# Patient Record
Sex: Female | Born: 1985 | Race: Black or African American | Hispanic: No | Marital: Single | Smoking: Never smoker
Health system: Southern US, Community
[De-identification: ages and names within clinical notes are randomized; demographics above are authoritative.]

## PROBLEM LIST (undated history)

## (undated) DIAGNOSIS — O139 Gestational [pregnancy-induced] hypertension without significant proteinuria, unspecified trimester: Secondary | ICD-10-CM

## (undated) DIAGNOSIS — D219 Benign neoplasm of connective and other soft tissue, unspecified: Secondary | ICD-10-CM

## (undated) DIAGNOSIS — A599 Trichomoniasis, unspecified: Secondary | ICD-10-CM

## (undated) DIAGNOSIS — G932 Benign intracranial hypertension: Secondary | ICD-10-CM

## (undated) DIAGNOSIS — F329 Major depressive disorder, single episode, unspecified: Secondary | ICD-10-CM

## (undated) DIAGNOSIS — F419 Anxiety disorder, unspecified: Secondary | ICD-10-CM

## (undated) DIAGNOSIS — N739 Female pelvic inflammatory disease, unspecified: Secondary | ICD-10-CM

## (undated) DIAGNOSIS — Z8619 Personal history of other infectious and parasitic diseases: Secondary | ICD-10-CM

## (undated) DIAGNOSIS — I10 Essential (primary) hypertension: Secondary | ICD-10-CM

## (undated) DIAGNOSIS — F32A Depression, unspecified: Secondary | ICD-10-CM

## (undated) HISTORY — PX: DILATION AND CURETTAGE OF UTERUS: SHX78

## (undated) HISTORY — PX: CERVICAL CERCLAGE: SHX1329

---
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
## 2009-12-05 ENCOUNTER — Inpatient Hospital Stay (HOSPITAL_COMMUNITY): Admission: AD | Admit: 2009-12-05 | Discharge: 2009-12-05 | Payer: Self-pay | Admitting: Obstetrics and Gynecology

## 2009-12-13 ENCOUNTER — Inpatient Hospital Stay (HOSPITAL_COMMUNITY): Admission: AD | Admit: 2009-12-13 | Discharge: 2009-12-13 | Payer: Self-pay | Admitting: Obstetrics & Gynecology

## 2009-12-18 ENCOUNTER — Inpatient Hospital Stay (HOSPITAL_COMMUNITY): Admission: AD | Admit: 2009-12-18 | Discharge: 2009-12-18 | Payer: Self-pay | Admitting: Obstetrics and Gynecology

## 2010-04-14 ENCOUNTER — Emergency Department (HOSPITAL_BASED_OUTPATIENT_CLINIC_OR_DEPARTMENT_OTHER): Admission: EM | Admit: 2010-04-14 | Discharge: 2010-04-14 | Payer: Self-pay | Admitting: Emergency Medicine

## 2010-04-14 ENCOUNTER — Ambulatory Visit: Payer: Self-pay | Admitting: Diagnostic Radiology

## 2010-08-16 ENCOUNTER — Ambulatory Visit (HOSPITAL_COMMUNITY): Payer: Self-pay | Admitting: Psychiatry

## 2010-08-25 ENCOUNTER — Emergency Department (HOSPITAL_BASED_OUTPATIENT_CLINIC_OR_DEPARTMENT_OTHER)
Admission: EM | Admit: 2010-08-25 | Discharge: 2010-08-25 | Payer: Self-pay | Source: Home / Self Care | Admitting: Emergency Medicine

## 2010-08-27 NOTE — L&D Delivery Note (Signed)
Delivery Note At 9:59 PM a viable female was delivered via Vaginal, Spontaneous Delivery (Presentation: ; Occiput Anterior).  APGAR: 9, 9; weight 5 lb 0.3 oz (2276 g).  ( Baby delivered in bed by RN at bedside.  I was in house in OR lounge ~50 feet from pt's room, which is sometimes a dead space for pagers and cell phones.  Staff paged me but not received.  Baby delivered within minutes of last cervical exam when she was complete, 1+ station.)  Placenta status: Intact, Spontaneous. Sent to pathology. Cord: 3 vessels with the following complications: None.  Cord pH: n/a  Anesthesia: Epidural  Episiotomy: None Lacerations: None Suture Repair: none Est. Blood Loss (mL):   Mom to AICU.  Baby to NICU. Not intubated.  Karina [name] 05/03/2011, 12:55 AM

## 2010-09-07 ENCOUNTER — Ambulatory Visit (HOSPITAL_COMMUNITY): Admit: 2010-09-07 | Payer: Self-pay | Admitting: Physician Assistant

## 2010-09-21 ENCOUNTER — Emergency Department (HOSPITAL_BASED_OUTPATIENT_CLINIC_OR_DEPARTMENT_OTHER)
Admission: EM | Admit: 2010-09-21 | Discharge: 2010-09-21 | Payer: Self-pay | Source: Home / Self Care | Admitting: Emergency Medicine

## 2010-09-21 LAB — URINALYSIS, ROUTINE W REFLEX MICROSCOPIC
Bilirubin Urine: NEGATIVE
Hgb urine dipstick: NEGATIVE
Urine Glucose, Fasting: NEGATIVE mg/dL
Urobilinogen, UA: 1 mg/dL (ref 0.0–1.0)

## 2010-09-21 LAB — COMPREHENSIVE METABOLIC PANEL
BUN: 14 mg/dL (ref 6–23)
Calcium: 9.4 mg/dL (ref 8.4–10.5)
GFR calc Af Amer: >60 mL/min (ref >60)
Glucose, Bld: 77 mg/dL (ref 70–99)
Potassium: 4.2 mEq/L (ref 3.5–5.1)
Sodium: 142 mEq/L (ref 135–145)
Total Protein: 7.5 g/dL (ref 6.0–8.3)

## 2010-09-21 LAB — LIPASE, BLOOD: Lipase: 99 U/L (ref 23–300)

## 2010-09-21 LAB — CBC
MCH: 25.2 pg — ABNORMAL LOW (ref 26.0–34.0)
Platelets: 380 10*3/uL (ref 150–400)
RBC: 4.84 MIL/uL (ref 3.87–5.11)
RDW: 16.7 % — ABNORMAL HIGH (ref 11.5–15.5)

## 2010-09-21 LAB — DIFFERENTIAL
Lymphocytes Relative: 28 % (ref 12–46)
Monocytes Absolute: 0.8 10*3/uL (ref 0.1–1.0)
Neutro Abs: 2.6 10*3/uL (ref 1.7–7.7)
Neutrophils Relative %: 53 % (ref 43–77)

## 2010-11-06 LAB — LIPASE, BLOOD: Lipase: 149 U/L (ref 23–300)

## 2010-11-06 LAB — DIFFERENTIAL
Basophils Absolute: 0 10*3/uL (ref 0.0–0.1)
Basophils Relative: 0 % (ref 0–1)
Eosinophils Absolute: 0.1 10*3/uL (ref 0.0–0.7)
Lymphs Abs: 2 10*3/uL (ref 0.7–4.0)
Monocytes Absolute: 0.7 10*3/uL (ref 0.1–1.0)
Neutro Abs: 2.5 10*3/uL (ref 1.7–7.7)
Neutrophils Relative %: 47 % (ref 43–77)

## 2010-11-06 LAB — COMPREHENSIVE METABOLIC PANEL
Albumin: 4.3 g/dL (ref 3.5–5.2)
BUN: 9 mg/dL (ref 6–23)
CO2: 23 mEq/L (ref 19–32)
Calcium: 9.7 mg/dL (ref 8.4–10.5)
Creatinine, Ser: 0.8 mg/dL (ref 0.4–1.2)
GFR calc Af Amer: >60 mL/min (ref >60)
Potassium: 4.1 mEq/L (ref 3.5–5.1)
Sodium: 144 mEq/L (ref 135–145)
Total Protein: 7.8 g/dL (ref 6.0–8.3)

## 2010-11-06 LAB — URINALYSIS, ROUTINE W REFLEX MICROSCOPIC
Bilirubin Urine: NEGATIVE
Glucose, UA: NEGATIVE mg/dL
Ketones, ur: NEGATIVE mg/dL
Specific Gravity, Urine: 1.025 (ref 1.005–1.030)
Urobilinogen, UA: 0.2 mg/dL (ref 0.0–1.0)

## 2010-11-06 LAB — CBC
MCH: 24.9 pg — ABNORMAL LOW (ref 26.0–34.0)
Platelets: 429 10*3/uL — ABNORMAL HIGH (ref 150–400)
WBC: 5.4 10*3/uL (ref 4.0–10.5)

## 2010-11-10 LAB — URINALYSIS, ROUTINE W REFLEX MICROSCOPIC
Glucose, UA: NEGATIVE mg/dL
Nitrite: NEGATIVE
Specific Gravity, Urine: 1.022 (ref 1.005–1.030)
pH: 7 (ref 5.0–8.0)

## 2010-11-14 LAB — COMPREHENSIVE METABOLIC PANEL
Albumin: 3.4 g/dL — ABNORMAL LOW (ref 3.5–5.2)
Alkaline Phosphatase: 64 U/L (ref 39–117)
CO2: 26 mEq/L (ref 19–32)
Chloride: 104 mEq/L (ref 96–112)
Creatinine, Ser: 0.84 mg/dL (ref 0.4–1.2)
GFR calc Af Amer: >60 mL/min (ref >60)
GFR calc non Af Amer: >60 mL/min (ref >60)
Sodium: 138 mEq/L (ref 135–145)

## 2010-11-14 LAB — WET PREP, GENITAL: Yeast Wet Prep HPF POC: NONE SEEN

## 2010-11-14 LAB — DIFFERENTIAL
Basophils Relative: 1 % (ref 0–1)
Eosinophils Absolute: 0.1 10*3/uL (ref 0.0–0.7)
Lymphocytes Relative: 25 % (ref 12–46)
Lymphs Abs: 1.6 10*3/uL (ref 0.7–4.0)
Monocytes Absolute: 0.6 10*3/uL (ref 0.1–1.0)
Monocytes Relative: 10 % (ref 3–12)
Neutro Abs: 3.8 10*3/uL (ref 1.7–7.7)
Neutrophils Relative %: 62 % (ref 43–77)

## 2010-11-14 LAB — CBC
HCT: 29.2 % — ABNORMAL LOW (ref 36.0–46.0)
MCHC: 33.5 g/dL (ref 30.0–36.0)
MCV: 87.1 fL (ref 78.0–100.0)
Platelets: 397 10*3/uL (ref 150–400)
RDW: 15 % (ref 11.5–15.5)

## 2010-11-14 LAB — GC/CHLAMYDIA PROBE AMP, GENITAL: Chlamydia, DNA Probe: NEGATIVE

## 2010-11-15 LAB — WET PREP, GENITAL: Clue Cells Wet Prep HPF POC: NONE SEEN

## 2010-11-15 LAB — CBC
HCT: 37.9 % (ref 36.0–46.0)
Hemoglobin: 12.8 g/dL (ref 12.0–15.0)
RBC: 4.34 MIL/uL (ref 3.87–5.11)

## 2010-11-15 LAB — GC/CHLAMYDIA PROBE AMP, GENITAL
Chlamydia, DNA Probe: NEGATIVE
GC Probe Amp, Genital: NEGATIVE

## 2010-11-28 LAB — RPR
RPR: NONREACTIVE
RPR: NONREACTIVE

## 2010-11-28 LAB — HIV ANTIBODY (ROUTINE TESTING W REFLEX): HIV: NONREACTIVE

## 2010-11-28 LAB — GC/CHLAMYDIA PROBE AMP, GENITAL: Chlamydia: NEGATIVE

## 2011-01-16 ENCOUNTER — Other Ambulatory Visit: Payer: Self-pay | Admitting: Obstetrics and Gynecology

## 2011-01-16 DIAGNOSIS — O2690 Pregnancy related conditions, unspecified, unspecified trimester: Secondary | ICD-10-CM

## 2011-01-19 ENCOUNTER — Other Ambulatory Visit: Payer: Self-pay | Admitting: Obstetrics and Gynecology

## 2011-01-19 ENCOUNTER — Ambulatory Visit (HOSPITAL_COMMUNITY)
Admission: RE | Admit: 2011-01-19 | Discharge: 2011-01-19 | Disposition: A | Discharge status: Home / Self Care | Payer: Medicaid Other | Source: Ambulatory Visit | Attending: Obstetrics and Gynecology | Admitting: Obstetrics and Gynecology

## 2011-01-19 DIAGNOSIS — O358XX0 Maternal care for other (suspected) fetal abnormality and damage, not applicable or unspecified: Secondary | ICD-10-CM | POA: Insufficient documentation

## 2011-01-19 DIAGNOSIS — O2620 Pregnancy care for patient with recurrent pregnancy loss, unspecified trimester: Secondary | ICD-10-CM | POA: Insufficient documentation

## 2011-01-19 DIAGNOSIS — Z363 Encounter for antenatal screening for malformations: Secondary | ICD-10-CM | POA: Insufficient documentation

## 2011-01-19 DIAGNOSIS — Z1389 Encounter for screening for other disorder: Secondary | ICD-10-CM | POA: Insufficient documentation

## 2011-01-19 DIAGNOSIS — O2690 Pregnancy related conditions, unspecified, unspecified trimester: Secondary | ICD-10-CM

## 2011-02-21 ENCOUNTER — Other Ambulatory Visit: Payer: Self-pay | Admitting: Obstetrics and Gynecology

## 2011-02-21 DIAGNOSIS — O3430 Maternal care for cervical incompetence, unspecified trimester: Secondary | ICD-10-CM

## 2011-02-22 ENCOUNTER — Other Ambulatory Visit: Payer: Self-pay | Admitting: Obstetrics and Gynecology

## 2011-02-22 ENCOUNTER — Encounter (HOSPITAL_COMMUNITY): Payer: Self-pay

## 2011-02-22 ENCOUNTER — Ambulatory Visit (HOSPITAL_COMMUNITY)
Admission: RE | Admit: 2011-02-22 | Discharge: 2011-02-22 | Disposition: A | Discharge status: Home / Self Care | Payer: Medicaid Other | Source: Ambulatory Visit | Attending: Obstetrics and Gynecology | Admitting: Obstetrics and Gynecology

## 2011-02-22 DIAGNOSIS — O3430 Maternal care for cervical incompetence, unspecified trimester: Secondary | ICD-10-CM

## 2011-02-22 DIAGNOSIS — O2620 Pregnancy care for patient with recurrent pregnancy loss, unspecified trimester: Secondary | ICD-10-CM

## 2011-03-08 ENCOUNTER — Encounter (HOSPITAL_COMMUNITY): Payer: Self-pay

## 2011-03-08 ENCOUNTER — Ambulatory Visit (HOSPITAL_COMMUNITY)
Admission: RE | Admit: 2011-03-08 | Discharge: 2011-03-08 | Disposition: A | Discharge status: Home / Self Care | Payer: Medicaid Other | Source: Ambulatory Visit | Attending: Obstetrics and Gynecology | Admitting: Obstetrics and Gynecology

## 2011-03-08 ENCOUNTER — Ambulatory Visit (HOSPITAL_COMMUNITY): Payer: Medicaid Other

## 2011-03-08 DIAGNOSIS — O3440 Maternal care for other abnormalities of cervix, unspecified trimester: Secondary | ICD-10-CM | POA: Insufficient documentation

## 2011-03-08 DIAGNOSIS — O2620 Pregnancy care for patient with recurrent pregnancy loss, unspecified trimester: Secondary | ICD-10-CM | POA: Insufficient documentation

## 2011-03-08 DIAGNOSIS — O3430 Maternal care for cervical incompetence, unspecified trimester: Secondary | ICD-10-CM

## 2011-03-22 ENCOUNTER — Ambulatory Visit (HOSPITAL_COMMUNITY)
Admission: RE | Admit: 2011-03-22 | Discharge: 2011-03-22 | Disposition: A | Discharge status: Home / Self Care | Payer: Medicaid Other | Source: Ambulatory Visit | Attending: Obstetrics and Gynecology | Admitting: Obstetrics and Gynecology

## 2011-03-22 DIAGNOSIS — O3430 Maternal care for cervical incompetence, unspecified trimester: Secondary | ICD-10-CM | POA: Insufficient documentation

## 2011-03-22 DIAGNOSIS — O2620 Pregnancy care for patient with recurrent pregnancy loss, unspecified trimester: Secondary | ICD-10-CM | POA: Insufficient documentation

## 2011-04-09 ENCOUNTER — Inpatient Hospital Stay (HOSPITAL_COMMUNITY)
Admission: AD | Admit: 2011-04-09 | Discharge: 2011-04-09 | Disposition: A | Discharge status: Home / Self Care | Payer: Medicaid Other | Source: Ambulatory Visit | Attending: Obstetrics and Gynecology | Admitting: Obstetrics and Gynecology

## 2011-04-09 DIAGNOSIS — O4700 False labor before 37 completed weeks of gestation, unspecified trimester: Secondary | ICD-10-CM | POA: Insufficient documentation

## 2011-04-09 MED ORDER — BETAMETHASONE SOD PHOS & ACET 6 (3-3) MG/ML IJ SUSP
12.0000 mg | Freq: Once | INTRAMUSCULAR | Status: AC
  Administered 2011-04-09: 12 mg via INTRAMUSCULAR
  Filled 2011-04-09: qty 2

## 2011-04-10 ENCOUNTER — Inpatient Hospital Stay (HOSPITAL_COMMUNITY)
Admission: AD | Admit: 2011-04-10 | Discharge: 2011-04-10 | Disposition: A | Discharge status: Home / Self Care | Payer: Medicaid Other | Source: Ambulatory Visit | Attending: Obstetrics and Gynecology | Admitting: Obstetrics and Gynecology

## 2011-04-10 DIAGNOSIS — O4700 False labor before 37 completed weeks of gestation, unspecified trimester: Secondary | ICD-10-CM | POA: Insufficient documentation

## 2011-04-10 MED ORDER — BETAMETHASONE SOD PHOS & ACET 6 (3-3) MG/ML IJ SUSP
12.0000 mg | Freq: Once | INTRAMUSCULAR | Status: AC
  Administered 2011-04-10: 12 mg via INTRAMUSCULAR
  Filled 2011-04-10: qty 2

## 2011-04-22 NOTE — ED Provider Notes (Signed)
25 yo G4 P0 A3, [redacted] weeks pregnant, with shortened cervix on ultrasound. Sent to MAU for betamethasone injection.   P:  Betamethasone 12 mg IM

## 2011-05-01 ENCOUNTER — Inpatient Hospital Stay (HOSPITAL_COMMUNITY): Payer: BC Managed Care – PPO

## 2011-05-01 ENCOUNTER — Inpatient Hospital Stay (HOSPITAL_COMMUNITY)
Admission: AD | Admit: 2011-05-01 | Discharge: 2011-05-06 | DRG: 372 | Disposition: A | Discharge status: Home / Self Care | Payer: BC Managed Care – PPO | Source: Ambulatory Visit | Attending: Obstetrics and Gynecology | Admitting: Obstetrics and Gynecology

## 2011-05-01 ENCOUNTER — Encounter (HOSPITAL_COMMUNITY): Payer: Self-pay | Admitting: *Deleted

## 2011-05-01 DIAGNOSIS — D649 Anemia, unspecified: Secondary | ICD-10-CM

## 2011-05-01 DIAGNOSIS — O1414 Severe pre-eclampsia complicating childbirth: Principal | ICD-10-CM | POA: Diagnosis present

## 2011-05-01 DIAGNOSIS — O139 Gestational [pregnancy-induced] hypertension without significant proteinuria, unspecified trimester: Secondary | ICD-10-CM

## 2011-05-01 HISTORY — DX: Benign neoplasm of connective and other soft tissue, unspecified: D21.9

## 2011-05-01 HISTORY — DX: Trichomoniasis, unspecified: A59.9

## 2011-05-01 LAB — COMPREHENSIVE METABOLIC PANEL
ALT: 6 U/L (ref 0–35)
Alkaline Phosphatase: 159 U/L — ABNORMAL HIGH (ref 39–117)
BUN: 3 mg/dL — ABNORMAL LOW (ref 6–23)
CO2: 23 mEq/L (ref 19–32)
Chloride: 104 mEq/L (ref 96–112)
GFR calc Af Amer: >60 mL/min (ref >60)
GFR calc non Af Amer: >60 mL/min (ref >60)
Glucose, Bld: 88 mg/dL (ref 70–99)
Potassium: 3.4 mEq/L — ABNORMAL LOW (ref 3.5–5.1)
Sodium: 135 mEq/L (ref 135–145)
Total Bilirubin: 0.2 mg/dL — ABNORMAL LOW (ref 0.3–1.2)
Total Protein: 6.8 g/dL (ref 6.0–8.3)

## 2011-05-01 LAB — CBC
HCT: 33.9 % — ABNORMAL LOW (ref 36.0–46.0)
Hemoglobin: 11.5 g/dL — ABNORMAL LOW (ref 12.0–15.0)
RBC: 4.03 MIL/uL (ref 3.87–5.11)
WBC: 7.7 10*3/uL (ref 4.0–10.5)

## 2011-05-01 MED ORDER — LACTATED RINGERS IV SOLN
INTRAVENOUS | Status: DC
  Administered 2011-05-02: 125 mL/h via INTRAVENOUS
  Administered 2011-05-02 – 2011-05-03 (×4): via INTRAVENOUS

## 2011-05-01 MED ORDER — DOCUSATE SODIUM 100 MG PO CAPS
100.0000 mg | ORAL_CAPSULE | Freq: Every day | ORAL | Status: DC
  Administered 2011-05-03 – 2011-05-06 (×4): 100 mg via ORAL
  Filled 2011-05-01 (×4): qty 1

## 2011-05-01 MED ORDER — ZOLPIDEM TARTRATE 10 MG PO TABS
10.0000 mg | ORAL_TABLET | Freq: Every evening | ORAL | Status: DC | PRN
  Administered 2011-05-02: 10 mg via ORAL
  Filled 2011-05-01: qty 1

## 2011-05-01 MED ORDER — NIFEDIPINE 10 MG PO CAPS
10.0000 mg | ORAL_CAPSULE | Freq: Four times a day (QID) | ORAL | Status: DC
  Administered 2011-05-01: 10 mg via ORAL
  Filled 2011-05-01: qty 1

## 2011-05-01 MED ORDER — CALCIUM CARBONATE ANTACID 500 MG PO CHEW: 2.0000 | CHEWABLE_TABLET | ORAL | Status: DC | PRN

## 2011-05-01 MED ORDER — PRENATAL PLUS 27-1 MG PO TABS: 1.0000 | ORAL_TABLET | Freq: Every day | ORAL | Status: DC

## 2011-05-01 MED ORDER — HYDRALAZINE HCL 20 MG/ML IJ SOLN
10.0000 mg | Freq: Once | INTRAMUSCULAR | Status: AC
  Administered 2011-05-01: 10 mg via INTRAVENOUS

## 2011-05-01 MED ORDER — HYDRALAZINE HCL 20 MG/ML IJ SOLN
5.0000 mg | Freq: Once | INTRAMUSCULAR | Status: AC
  Administered 2011-05-01: 5 mg via INTRAVENOUS
  Filled 2011-05-01: qty 1

## 2011-05-01 MED ORDER — LACTATED RINGERS IV SOLN
Freq: Once | INTRAVENOUS | Status: AC
  Administered 2011-05-02: via INTRAVENOUS

## 2011-05-01 MED ORDER — ACETAMINOPHEN 325 MG PO TABS
650.0000 mg | ORAL_TABLET | ORAL | Status: DC | PRN
  Administered 2011-05-02: 650 mg via ORAL
  Filled 2011-05-01: qty 2

## 2011-05-01 MED ORDER — HYDRALAZINE HCL 20 MG/ML IJ SOLN
INTRAMUSCULAR | Status: AC
  Administered 2011-05-01: 10 mg via INTRAVENOUS
  Filled 2011-05-01: qty 1

## 2011-05-01 NOTE — ED Notes (Signed)
Pt to U/S via wheelchair.  Pt will go to room 157 from U/S.

## 2011-05-01 NOTE — ED Notes (Signed)
Report called to [facility] on antenatal. Pt. will go to room 157

## 2011-05-01 NOTE — Progress Notes (Signed)
Pt states she has had elevated BP. Sent from the office today for evaluation.

## 2011-05-01 NOTE — ED Notes (Signed)
Report given to [employer].  Pt to go to room 157 after U/S.

## 2011-05-01 NOTE — ED Provider Notes (Signed)
History   Pt presents today for [facility] evaluation. She was sent from the office with elevated BP. She had a 24hr urine done in the office but the results are not available. She denies HA, blurry vision, or RUQ pain. She reports GFM.  Chief Complaint  Patient presents with  . Hypertension   HPI  OB History    Grav Para Term Preterm Abortions TAB SAB Ect Mult Living   4 0 0 0 3 0 3 0 0 0       Past Medical History  Diagnosis Date  . Fibroids   . Trichomonas     Past Surgical History  Procedure Date  . Dilation and curettage of uterus     No family history on file.  History  Substance Use Topics  . Smoking status: Former Games developer  . Smokeless tobacco: Never Used  . Alcohol Use: No    Allergies: No Known Allergies  Prescriptions prior to admission  Medication Sig Dispense Refill  . metroNIDAZOLE (FLAGYL) 500 MG tablet Take 500 mg by mouth 2 (two) times daily. Take for 1 week as instructed       . Prenatal MV-Min-Fe Fum-FA-DHA (PRENATAL 1 PO) Take 1 tablet by mouth daily.       . MetroNIDAZOLE (FLAGYL PO) Take by mouth. Pt states she is almost done taking a course of this for a bacterial infection.        Review of Systems  Constitutional: Negative for fever.  Eyes: Negative for blurred vision.  Cardiovascular: Negative for chest pain and palpitations.  Gastrointestinal: Negative for nausea, vomiting, abdominal pain, diarrhea and constipation.  Genitourinary: Negative for dysuria, urgency, frequency and hematuria.  Neurological: Negative for dizziness and headaches.  Psychiatric/Behavioral: Negative for depression and suicidal ideas.   Physical Exam   Blood pressure 151/102, pulse 78, temperature 98.7 F (37.1 C), temperature source Oral, resp. rate 20, height 5\' 5"  (1.651 m), weight 232 lb (105.235 kg), last menstrual period 09/04/2010, SpO2 97.00%.  Physical Exam  Constitutional: She is oriented to person, place, and time. She appears well-developed and  well-nourished. No distress.  HENT:  Head: Normocephalic and atraumatic.  Eyes: EOM are normal. Pupils are equal, round, and reactive to light.  GI: Soft. She exhibits no distension. There is no tenderness. There is no rebound and no guarding.  Neurological: She is alert and oriented to person, place, and time.  Skin: Skin is warm and dry. She is not diaphoretic.  Psychiatric: She has a normal mood and affect. Her behavior is normal. Judgment and thought content normal.    MAU Course  Procedures  Discussed pt with Dr. Richardson Dopp. Dr. Richardson Dopp desires to admit pt for observation and preeclamptic workup. She also ordered a BPP and [place] for EFW.  Assessment and Plan  Admit.  Clinton Gallant. Rice III, DrHSc, MPAS, PA-C  05/01/2011, 6:39 PM   Henrietta Hoover, PA 05/01/11 1846

## 2011-05-02 ENCOUNTER — Encounter (HOSPITAL_COMMUNITY): Payer: Self-pay | Admitting: Anesthesiology

## 2011-05-02 ENCOUNTER — Encounter (HOSPITAL_COMMUNITY): Payer: Self-pay | Admitting: *Deleted

## 2011-05-02 ENCOUNTER — Inpatient Hospital Stay (HOSPITAL_COMMUNITY): Payer: BC Managed Care – PPO | Admitting: Anesthesiology

## 2011-05-02 LAB — URINALYSIS, DIPSTICK ONLY
Bilirubin Urine: NEGATIVE
Glucose, UA: NEGATIVE mg/dL
Ketones, ur: 15 mg/dL — AB
Leukocytes, UA: NEGATIVE
Nitrite: NEGATIVE
Specific Gravity, Urine: >1.03 — ABNORMAL HIGH (ref 1.005–1.030)
pH: 6 (ref 5.0–8.0)

## 2011-05-02 LAB — CBC
MCH: 28.6 pg (ref 26.0–34.0)
MCHC: 34.1 g/dL (ref 30.0–36.0)
MCV: 84.1 fL (ref 78.0–100.0)
Platelets: 267 10*3/uL (ref 150–400)
Platelets: 321 10*3/uL (ref 150–400)
RDW: 14.5 % (ref 11.5–15.5)
RDW: 14.7 % (ref 11.5–15.5)
WBC: 12.2 10*3/uL — ABNORMAL HIGH (ref 4.0–10.5)

## 2011-05-02 LAB — COMPREHENSIVE METABOLIC PANEL
AST: 11 U/L (ref 0–37)
AST: 13 U/L (ref 0–37)
Albumin: 2.5 g/dL — ABNORMAL LOW (ref 3.5–5.2)
Albumin: 2.6 g/dL — ABNORMAL LOW (ref 3.5–5.2)
Alkaline Phosphatase: 161 U/L — ABNORMAL HIGH (ref 39–117)
BUN: 3 mg/dL — ABNORMAL LOW (ref 6–23)
Calcium: 9 mg/dL (ref 8.4–10.5)
Chloride: 101 mEq/L (ref 96–112)
Creatinine, Ser: 0.54 mg/dL (ref 0.50–1.10)
GFR calc non Af Amer: >60 mL/min (ref >60)
Potassium: 3.3 mEq/L — ABNORMAL LOW (ref 3.5–5.1)
Total Bilirubin: 0.3 mg/dL (ref 0.3–1.2)
Total Protein: 6.4 g/dL (ref 6.0–8.3)

## 2011-05-02 LAB — LACTATE DEHYDROGENASE
LDH: 152 U/L (ref 94–250)
LDH: 157 U/L (ref 94–250)

## 2011-05-02 LAB — URIC ACID: Uric Acid, Serum: 5 mg/dL (ref 2.4–7.0)

## 2011-05-02 LAB — MAGNESIUM: Magnesium: 4.9 mg/dL — ABNORMAL HIGH (ref 1.5–2.5)

## 2011-05-02 MED ORDER — IBUPROFEN 600 MG PO TABS: 600.0000 mg | ORAL_TABLET | Freq: Four times a day (QID) | ORAL | Status: DC | PRN

## 2011-05-02 MED ORDER — ACETAMINOPHEN 325 MG PO TABS
650.0000 mg | ORAL_TABLET | ORAL | Status: DC | PRN
Start: 2011-05-02 — End: 2011-05-02

## 2011-05-02 MED ORDER — MORPHINE SULFATE 4 MG/ML IJ SOLN
2.0000 mg | Freq: Once | INTRAMUSCULAR | Status: AC
  Administered 2011-05-02: 2 mg via INTRAVENOUS
  Filled 2011-05-02: qty 1

## 2011-05-02 MED ORDER — MAGNESIUM SULFATE 40 G IN LACTATED RINGERS - SIMPLE
2.0000 g/h | INTRAVENOUS | Status: DC
  Administered 2011-05-02: 4 g/h via INTRAVENOUS
  Filled 2011-05-02: qty 500

## 2011-05-02 MED ORDER — BUTORPHANOL TARTRATE 2 MG/ML IJ SOLN: 1.0000 mg | INTRAMUSCULAR | Status: DC | PRN

## 2011-05-02 MED ORDER — OXYCODONE-ACETAMINOPHEN 5-325 MG PO TABS
2.0000 | ORAL_TABLET | Freq: Once | ORAL | Status: AC
  Administered 2011-05-02: 2 via ORAL
  Filled 2011-05-02: qty 2

## 2011-05-02 MED ORDER — PENICILLIN G POTASSIUM 5000000 UNITS IJ SOLR
2.5000 10*6.[IU] | INTRAVENOUS | Status: DC
  Administered 2011-05-02: 2.5 10*6.[IU] via INTRAVENOUS
  Filled 2011-05-02 (×4): qty 2.5

## 2011-05-02 MED ORDER — OXYCODONE-ACETAMINOPHEN 5-325 MG PO TABS: 2.0000 | ORAL_TABLET | ORAL | Status: DC | PRN

## 2011-05-02 MED ORDER — HYDRALAZINE HCL 20 MG/ML IJ SOLN
5.0000 mg | INTRAMUSCULAR | Status: DC | PRN
  Administered 2011-05-02 – 2011-05-04 (×6): 5 mg via INTRAVENOUS
  Filled 2011-05-02 (×4): qty 1

## 2011-05-02 MED ORDER — LACTATED RINGERS IV SOLN: INTRAVENOUS | Status: DC

## 2011-05-02 MED ORDER — LACTATED RINGERS IV SOLN: 500.0000 mL | Freq: Once | INTRAVENOUS | Status: DC

## 2011-05-02 MED ORDER — ONDANSETRON HCL 4 MG/2ML IJ SOLN
4.0000 mg | Freq: Four times a day (QID) | INTRAMUSCULAR | Status: DC | PRN
  Administered 2011-05-02 (×2): 4 mg via INTRAVENOUS
  Filled 2011-05-02 (×3): qty 2

## 2011-05-02 MED ORDER — FLEET ENEMA 7-19 GM/118ML RE ENEM: 1.0000 | ENEMA | RECTAL | Status: DC | PRN

## 2011-05-02 MED ORDER — OXYTOCIN 20 UNITS IN LACTATED RINGERS INFUSION - SIMPLE
1.0000 m[IU]/min | INTRAVENOUS | Status: DC
  Administered 2011-05-02: 2 m[IU]/min via INTRAVENOUS

## 2011-05-02 MED ORDER — PHENYLEPHRINE 40 MCG/ML (10ML) SYRINGE FOR IV PUSH (FOR BLOOD PRESSURE SUPPORT): 80.0000 ug | PREFILLED_SYRINGE | INTRAVENOUS | Status: DC | PRN

## 2011-05-02 MED ORDER — CITRIC ACID-SODIUM CITRATE 334-500 MG/5ML PO SOLN
30.0000 mL | ORAL | Status: DC | PRN
  Filled 2011-05-02: qty 15

## 2011-05-02 MED ORDER — HYDRALAZINE HCL 20 MG/ML IJ SOLN
INTRAMUSCULAR | Status: AC
  Administered 2011-05-02: 5 mg via INTRAVENOUS
  Filled 2011-05-02: qty 1

## 2011-05-02 MED ORDER — PHENYLEPHRINE 40 MCG/ML (10ML) SYRINGE FOR IV PUSH (FOR BLOOD PRESSURE SUPPORT)
80.0000 ug | PREFILLED_SYRINGE | INTRAVENOUS | Status: DC | PRN
  Filled 2011-05-02: qty 5

## 2011-05-02 MED ORDER — FAMOTIDINE IN NACL 20-0.9 MG/50ML-% IV SOLN
20.0000 mg | Freq: Two times a day (BID) | INTRAVENOUS | Status: DC
  Administered 2011-05-02: 20 mg via INTRAVENOUS
  Filled 2011-05-02 (×2): qty 50

## 2011-05-02 MED ORDER — OXYTOCIN BOLUS FROM INFUSION
500.0000 mL | Freq: Once | INTRAVENOUS | Status: DC
  Filled 2011-05-02: qty 500
  Filled 2011-05-02: qty 1000

## 2011-05-02 MED ORDER — FENTANYL 2.5 MCG/ML BUPIVACAINE 1/10 % EPIDURAL INFUSION (WH - ANES)
14.0000 mL/h | INTRAMUSCULAR | Status: DC
  Administered 2011-05-02 (×3): 14 mL/h via EPIDURAL
  Filled 2011-05-02 (×3): qty 60

## 2011-05-02 MED ORDER — LABETALOL HCL 5 MG/ML IV SOLN: 10.0000 mg | INTRAVENOUS | Status: DC

## 2011-05-02 MED ORDER — PENICILLIN G POTASSIUM 5000000 UNITS IJ SOLR
5.0000 10*6.[IU] | Freq: Once | INTRAVENOUS | Status: DC
  Administered 2011-05-02: 5 10*6.[IU] via INTRAVENOUS
  Filled 2011-05-02: qty 5

## 2011-05-02 MED ORDER — LACTATED RINGERS IV SOLN: 500.0000 mL | INTRAVENOUS | Status: DC | PRN

## 2011-05-02 MED ORDER — HYDRALAZINE HCL 20 MG/ML IJ SOLN
5.0000 mg | Freq: Once | INTRAMUSCULAR | Status: AC
  Administered 2011-05-02: 5 mg via INTRAVENOUS

## 2011-05-02 MED ORDER — MAGNESIUM SULFATE BOLUS VIA INFUSION
4.0000 g | Freq: Once | INTRAVENOUS | Status: DC
  Filled 2011-05-02: qty 500

## 2011-05-02 MED ORDER — EPHEDRINE 5 MG/ML INJ: 10.0000 mg | INTRAVENOUS | Status: DC | PRN

## 2011-05-02 MED ORDER — EPHEDRINE 5 MG/ML INJ
10.0000 mg | INTRAVENOUS | Status: DC | PRN
  Filled 2011-05-02: qty 4

## 2011-05-02 MED ORDER — LIDOCAINE HCL (PF) 1 % IJ SOLN: 30.0000 mL | INTRAMUSCULAR | Status: DC | PRN

## 2011-05-02 MED ORDER — OXYTOCIN 20 UNITS IN LACTATED RINGERS INFUSION - SIMPLE: 125.0000 mL/h | Freq: Once | INTRAVENOUS | Status: DC

## 2011-05-02 MED ORDER — LIDOCAINE HCL 1.5 % IJ SOLN
INTRAMUSCULAR | Status: DC | PRN
  Administered 2011-05-02: 2 mL via EPIDURAL
  Administered 2011-05-02: 5 mL via EPIDURAL

## 2011-05-02 MED ORDER — TERBUTALINE SULFATE 1 MG/ML IJ SOLN: 0.2500 mg | Freq: Once | INTRAMUSCULAR | Status: DC | PRN

## 2011-05-02 MED ORDER — DIPHENHYDRAMINE HCL 50 MG/ML IJ SOLN: 12.5000 mg | INTRAMUSCULAR | Status: DC | PRN

## 2011-05-02 MED ORDER — METHYLDOPA 250 MG PO TABS
250.0000 mg | ORAL_TABLET | Freq: Two times a day (BID) | ORAL | Status: DC
  Administered 2011-05-02: 250 mg via ORAL
  Filled 2011-05-02 (×2): qty 1

## 2011-05-02 NOTE — Anesthesia Procedure Notes (Signed)
Epidural Patient location during procedure: OB Start time: 05/02/2011 1:00 PM Reason for block: procedure for pain  Staffing Performed by: anesthesiologist   Preanesthetic Checklist Completed: patient identified, site marked, surgical consent, pre-op evaluation, timeout performed, IV checked, risks and benefits discussed and monitors and equipment checked  Epidural Patient position: sitting Prep: site prepped and draped and DuraPrep Patient monitoring: continuous pulse ox and blood pressure Approach: midline Injection technique: LOR air  Needle:  Needle type: Tuohy  Needle gauge: 17 G Needle length: 9 cm Needle insertion depth: 7 cm Catheter type: closed end flexible Catheter size: 19 Gauge Catheter at skin depth: 12 cm Test dose: negative  Assessment Events: blood not aspirated, injection not painful, no injection resistance, negative IV test and no paresthesia  Additional Notes Discussed risk of headache, infection, bleeding, nerve injury and failed or incomplete block.  Patient voices understanding and wishes to proceed.

## 2011-05-02 NOTE — H&P (Signed)
Karina [name] is a 25 y.o. female G3P0030 at 63 1/7 weeks presenting for admission for observation due to an elevated blood pressure at a routine ob visit.   Blood pressure started elevating 2 weeks ago.  24 hour urine protein 102.  BP on arrival to office yesterday was 158/108; trace urine protein.  Pt asymptomatic at that time.  Blood pressure significantly elevated overnight, highest 170s/117.  Pt currently is complaining of headache and dizziness.  Headache unrelieved with Percocet.  RN ready to give Morphine as ordered by on call MD.    Maternal Medical History:  Reason for admission: Elevated blood pressure at a routine ob visit  Contractions: Increased overnight after admission.  After IVF bolus and Procardia, pt without contractions.  Fetal activity: Perceived fetal activity is normal.    Prenatal complications: Blood pressure started elevating 2 weeks ago.  24 hour urine protein 104.  BP on arrival to office yesterday was 158/108.  Pt asymptomatic at that time.  Short cervix s/p McDonald cerclage at 19 weeks. H/o SAB at 17 weeks (PPROM) .  Pt has been on Progesterone injections.    Pt reports she was given BMZ a few weeks ago, which was not seen in the limited prenatal record. OB History    Grav Para Term Preterm Abortions TAB SAB Ect Mult Living   4 0 0 0 3 0 3 0 0 0      Past Medical History  Diagnosis Date  . Fibroids   . Trichomonas    Past Surgical History  Procedure Date  . Dilation and curettage of uterus    Family History: Hypertension. Social History:  reports that she has quit smoking. She has never used smokeless tobacco. She reports that she does not drink alcohol or use illicit drugs.    Review of Systems  Constitutional: Negative for fever.  HENT:       Unrelieved with Percocet overnight.  Eyes: Negative for blurred vision.       No scotomata. Feels dizzy.  Respiratory: Negative for shortness of breath.   Gastrointestinal: Positive for vomiting  and abdominal pain.       Abdominal pain with contractions.  Denies RUQ pain.  Neurological: Positive for dizziness and headaches.   BP 152/101, 133/83.  Afebrile.  Nl HR.  Maternal Exam:  Uterine Assessment: Contraction frequency is irregular.   Abdomen: Fetal presentation: vertex No RUQ tenderness.  Introitus: not evaluated.   Ferning test: not done.  Nitrazine test: not done.  Cervix: not evaluated.   Physical Exam  Constitutional: She is oriented to person, place, and time. She appears well-developed and well-nourished.       Appears uncomfortable.  HENT:  Head: Normocephalic and atraumatic.  Eyes: EOM are normal.  Cardiovascular: Normal rate and regular rhythm.   Respiratory: Effort normal and breath sounds normal. No respiratory distress. She has no wheezes. She has no rales.  GI: There is no tenderness.       Gravid  Musculoskeletal: Normal range of motion.  Neurological: She is alert and oriented to person, place, and time.       None illicited.  Skin: Skin is warm and dry.     PIH labs normal on admission.  24 hr urine in progress.  Prenatal labs: (unable to access Prenatal records to review labs) Rubella:   RPR: NON REACTIVE (09/05 0852)  HBsAg:    HIV:    GBS:   Unknown Rh Positive  [place] (05/01/11)  Vtx.  AFI nl.  Wt 5 lbs. 2 oz.  Assessment/Plan: Severe Preeclampsia- Pt with severe range BPs overnight.  S/p Hydralazine x 3.  Given Procardia overnight after hydralazine for contractions.  BP improved overnight but now increasing.  Currently with headache unrelieved with medication.  Not hyperreflexic. Discussed with MFM Dr. Particia Nearing and she agrees to proceed with delivery due to Severe Preeclampsia. Will start Magnesium Sulfate. NICU consult. Cerclage removal on L&D.  Karina [name] 05/02/2011, 7:31 PM

## 2011-05-02 NOTE — Consult Note (Signed)
[facility]  Neonatal Medicine Consultation       05/02/2011    7:51 PM  I was called at the request of the patient's obstetrician (Dr. Dion Body) to speak to this patient due to induction of labor at 34 weeks.  Mom admitted yesterday with preeclampsia, and decision made today to start induction.  She has been given betamethasone.  I reviewed expectations for a 34 week newborn, and predicted likely length of stay.  Mom had a few questions which I answered.  _____________________ Electronically Signed By: Angelita Ingles, MD Neonatologist

## 2011-05-02 NOTE — Progress Notes (Signed)
Pt stated that she was feeling pain at  3/10.  Epidural level was checked and found to be a T 10.  Pt was repositioned to left tilt

## 2011-05-02 NOTE — Procedures (Signed)
Cerclage Removal Pre-procedure Diagnosis-Severe Preeclampsia, contractions  SVE 1cm 50%, knot at 12 o'clock. Graves speculum inserted.  Frothy yellow discharge noted and wet prep collected. Merselene tape visualized.  Knot tented up and cerclage cut.  The knot came out with two tails but the entire cerclage was not removed.  Attempted to explore anterior cervix with Allis clamp.  Merselene not seen.  Due to pt discomfort retrieval of remaining suture aborted.  Collected wet prep again b/c previous sample was not labeled.  As I did not put in the cerclage, I discussed the technique with MFM on call.  Confirmed it was a McDonald cerclage and how the knots were placed.  Suspected I only cut the air knot. An epidural was placed after magnesium bolus. Pt comfortable.  ~Two hours after the original attempt, SVE done and remaining knot/suture was palpated at 12 o'clock. Dr. Richardson Dopp at bedside to assist and also examined pt.  Once knot palpated, speculum was inserted, cervix prepped with betadine and anterior lip of cervix was retracted posteriorly.  Suture was then visualized, grasped with Allis clamp and easily removed in its entirety.  Pt tolerated the procedure well. The second wet prep opened in the bag and could not be used.  Wet prep not repeated b/c of bleeding and betadine had been used.  EBL minimal.

## 2011-05-02 NOTE — Progress Notes (Signed)
MARITSA HUNSUCKER is a 25 y.o. G4P0030 at [redacted]w[redacted]d   Subjective: Pt denies headache comfortable with epidural.  Occasional vomiting.  Objective:  140s-150s/80-90s  FHT:  Reassuring UC:  Irregular contractions SVE:   Dilation: 3.5 Effacement (%): 70 Station: -1 Exam by:: Dr Dion Body AROM blood tinged.  Suspect it is clear. Labs: Lab Results  Component Value Date   WBC 7.6 05/02/2011   HGB 11.2* 05/02/2011   HCT 32.9* 05/02/2011   MCV 84.1 05/02/2011   PLT 267 05/02/2011     Assessment / Plan: Severe Preeclampsia on MgSO4 and Pitocin S/p AROM. GBS unknown.  2nd dose of PCN infusing now     Labor: Minimal change Preeclampsia:  on magnesium sulfate and no signs or symptoms of toxicity Fetal Wellbeing:  Reassuring Pain Control:  Epidural I/D:  n/a Anticipated MOD:  NSVD  Arling Cerone 05/02/2011, 9:05 PM

## 2011-05-02 NOTE — Anesthesia Preprocedure Evaluation (Signed)
Anesthesia Evaluation  Name, MR# and DOB Patient awake  General Assessment Comment  Reviewed: Allergy & Precautions, H&P , NPO status , Patient's Chart, lab work & pertinent test results, reviewed documented beta blocker date and time   History of Anesthesia Complications Negative for: history of anesthetic complications  Airway Mallampati: I TM Distance: >3 FB Neck ROM: full    Dental  (+) Teeth Intact   Pulmonary  clear to auscultation  breath sounds clear to auscultation none    Cardiovascular hypertension (PIH on magnesium), regular Normal    Neuro/Psych Negative Neurological ROS  Negative Psych ROS  GI/Hepatic/Renal negative GI ROS  negative Liver ROS  negative Renal ROS        Endo/Other  Negative Endocrine ROS (+)      Abdominal   Musculoskeletal   Hematology negative hematology ROS (+)   Peds  Reproductive/Obstetrics (+) Pregnancy    Anesthesia Other Findings             Anesthesia Physical Anesthesia Plan  ASA: III  Anesthesia Plan: Epidural   Post-op Pain Management:    Induction:   Airway Management Planned:   Additional Equipment:   Intra-op Plan:   Post-operative Plan:   Informed Consent: I have reviewed the patients History and Physical, chart, labs and discussed the procedure including the risks, benefits and alternatives for the proposed anesthesia with the patient or authorized representative who has indicated his/her understanding and acceptance.     Plan Discussed with:   Anesthesia Plan Comments:         Anesthesia Quick Evaluation

## 2011-05-02 NOTE — Progress Notes (Signed)
Karina [name] is a 25 y.o. G4P0030 at [redacted]w[redacted]d Subjective: Pt with headache, 1 out of 10 on pain scale.  Declines Tylenol PR.  Feels warm.  No visual changes. Continues to have nausea and vomiting.  Pt given Zofran recently.  Objective: BP 160/93  Pulse 85  Temp(Src) 98.5 F (36.9 C) (Oral)  Resp 18  Ht 5\' 5"  (1.651 m)  Wt 105.235 kg (232 lb)  BMI 38.61 kg/m2  SpO2 98%  LMP 09/04/2010 I/O last 3 completed shifts: In: 4280.4 [P.O.:1140; I.V.:2790.4; IV Piggyback:350] Out: 3330 [Urine:3180; Emesis/NG output:150] I/O this shift: In: 50 [P.O.:50] Out: 225 [Emesis/NG output:225]  FHT:  FHR: 130s bpm, variability: moderate,  accelerations:  Present,  decelerations:  Present variable decel x 2 UC:   regular, every 3-4 minutes SVE:   4/90/0.  Thicker cervix on right side. Neuro: 1+ per RN  Labs: Lab Results  Component Value Date   WBC 7.6 05/02/2011   HGB 11.2* 05/02/2011   HCT 32.9* 05/02/2011   MCV 84.1 05/02/2011   PLT 267 05/02/2011  Mg level 4.9 at ~1730.  Assessment / Plan: Induction of labor due to preeclampsia,  progressing well on pitocin  Progressing to active labor. N/V.  Repeat PIH labs.  Pepcid hanging.  Check mg level with PIH panel.  If less than 5, increase.  Labor: Progressing to active labor.  Pitocin increased appriorately. Preeclampsia:  on magnesium sulfate, no signs or symptoms of toxicity and labs stable.  Repeating labs due to nausea/vomiting. Fetal Wellbeing:  Reassuring Pain Control:  Epidural I/D:  NSVD Anticipated MOD:  NSVD  Karina [name] 05/02/2011, 8:29 PM

## 2011-05-02 NOTE — Progress Notes (Signed)
Pt complaining of feeling pressure with contractions

## 2011-05-03 ENCOUNTER — Encounter (HOSPITAL_COMMUNITY): Payer: Self-pay | Admitting: *Deleted

## 2011-05-03 ENCOUNTER — Other Ambulatory Visit: Payer: Self-pay | Admitting: Obstetrics and Gynecology

## 2011-05-03 LAB — CBC
Hemoglobin: 10.7 g/dL — ABNORMAL LOW (ref 12.0–15.0)
MCH: 28.5 pg (ref 26.0–34.0)
MCHC: 33.3 g/dL (ref 30.0–36.0)
RDW: 14.7 % (ref 11.5–15.5)

## 2011-05-03 LAB — MRSA PCR SCREENING: MRSA by PCR: NEGATIVE

## 2011-05-03 LAB — POTASSIUM: Potassium: 3.5 mEq/L (ref 3.5–5.1)

## 2011-05-03 MED ORDER — MAGNESIUM SULFATE 40 G IN LACTATED RINGERS - SIMPLE
2.5000 g/h | INTRAVENOUS | Status: DC
  Administered 2011-05-03: 2 g/h via INTRAVENOUS
  Administered 2011-05-03: 2.5 g/h via INTRAVENOUS
  Filled 2011-05-03: qty 500

## 2011-05-03 MED ORDER — TETANUS-DIPHTH-ACELL PERTUSSIS 5-2.5-18.5 LF-MCG/0.5 IM SUSP
0.5000 mL | Freq: Once | INTRAMUSCULAR | Status: DC
  Filled 2011-05-03: qty 0.5

## 2011-05-03 MED ORDER — ZOLPIDEM TARTRATE 5 MG PO TABS
5.0000 mg | ORAL_TABLET | Freq: Every evening | ORAL | Status: DC | PRN
  Administered 2011-05-05: 5 mg via ORAL
  Filled 2011-05-03: qty 1

## 2011-05-03 MED ORDER — SODIUM CHLORIDE 0.9 % IJ SOLN
3.0000 mL | Freq: Once | INTRAMUSCULAR | Status: AC
  Administered 2011-05-03: 3 mL via INTRAVENOUS

## 2011-05-03 MED ORDER — METHYLDOPA 250 MG PO TABS
250.0000 mg | ORAL_TABLET | Freq: Three times a day (TID) | ORAL | Status: DC
  Administered 2011-05-03 – 2011-05-04 (×4): 250 mg via ORAL
  Filled 2011-05-03 (×3): qty 1

## 2011-05-03 MED ORDER — SODIUM CHLORIDE 0.9 % IJ SOLN
3.0000 mL | INTRAMUSCULAR | Status: DC | PRN
  Administered 2011-05-03 – 2011-05-04 (×2): 3 mL via INTRAVENOUS

## 2011-05-03 MED ORDER — SENNOSIDES-DOCUSATE SODIUM 8.6-50 MG PO TABS
2.0000 | ORAL_TABLET | Freq: Every day | ORAL | Status: DC
  Administered 2011-05-03 – 2011-05-05 (×3): 2 via ORAL

## 2011-05-03 MED ORDER — SIMETHICONE 80 MG PO CHEW: 80.0000 mg | CHEWABLE_TABLET | ORAL | Status: DC | PRN

## 2011-05-03 MED ORDER — BENZOCAINE-MENTHOL 20-0.5 % EX AERO
1.0000 "application " | INHALATION_SPRAY | CUTANEOUS | Status: DC | PRN
  Administered 2011-05-04: 1 via TOPICAL

## 2011-05-03 MED ORDER — HYDRALAZINE HCL 20 MG/ML IJ SOLN
5.0000 mg | Freq: Once | INTRAMUSCULAR | Status: AC
  Administered 2011-05-03: 5 mg via INTRAVENOUS
  Filled 2011-05-03: qty 1

## 2011-05-03 MED ORDER — DIPHENHYDRAMINE HCL 25 MG PO CAPS: 25.0000 mg | ORAL_CAPSULE | Freq: Four times a day (QID) | ORAL | Status: DC | PRN

## 2011-05-03 MED ORDER — IBUPROFEN 600 MG PO TABS
600.0000 mg | ORAL_TABLET | Freq: Four times a day (QID) | ORAL | Status: DC
  Administered 2011-05-03 – 2011-05-06 (×15): 600 mg via ORAL
  Filled 2011-05-03 (×16): qty 1

## 2011-05-03 MED ORDER — WITCH HAZEL-GLYCERIN EX PADS: 1.0000 "application " | MEDICATED_PAD | CUTANEOUS | Status: DC | PRN

## 2011-05-03 MED ORDER — PRENATAL PLUS 27-1 MG PO TABS
1.0000 | ORAL_TABLET | Freq: Every day | ORAL | Status: DC
  Administered 2011-05-03 – 2011-05-06 (×4): 1 via ORAL
  Filled 2011-05-03 (×4): qty 1

## 2011-05-03 MED ORDER — DIBUCAINE 1 % RE OINT: 1.0000 "application " | TOPICAL_OINTMENT | RECTAL | Status: DC | PRN

## 2011-05-03 MED ORDER — LANOLIN HYDROUS EX OINT: TOPICAL_OINTMENT | CUTANEOUS | Status: DC | PRN

## 2011-05-03 MED ORDER — ONDANSETRON HCL 4 MG/2ML IJ SOLN: 4.0000 mg | INTRAMUSCULAR | Status: DC | PRN

## 2011-05-03 MED ORDER — OXYCODONE-ACETAMINOPHEN 5-325 MG PO TABS
1.0000 | ORAL_TABLET | ORAL | Status: DC | PRN
  Administered 2011-05-03 – 2011-05-05 (×7): 1 via ORAL
  Filled 2011-05-03 (×7): qty 1

## 2011-05-03 MED ORDER — ONDANSETRON HCL 4 MG PO TABS: 4.0000 mg | ORAL_TABLET | ORAL | Status: DC | PRN

## 2011-05-03 MED ORDER — MAGNESIUM HYDROXIDE 400 MG/5ML PO SUSP: 30.0000 mL | ORAL | Status: DC | PRN

## 2011-05-03 NOTE — Progress Notes (Signed)
Post Partum Day 1 Subjective: no complaints. Denies headaches, visual changes.  Baby doing well in NICU.  Objective: Blood pressure 151/103, pulse 74, temperature 97.5 F (36.4 C), temperature source Oral, resp. rate 20, height 5\' 5"  (1.651 m), weight 102.967 kg (227 lb), last menstrual period 09/04/2010, SpO2 100.00%, unknown if currently breastfeeding. 2000 cc urine/ 5 hours.  Physical Exam:  General: alert and cooperative Lochia: appropriate per RN  Uterine Fundus: firm Incision: dehiscence present N/a DVT Evaluation: No evidence of DVT seen on physical exam. Neuro 2-3+    Basename 05/03/11 0614 05/02/11 2110  HGB 10.7* 12.0  HCT 32.1* 35.2*    Assessment/Plan: Breastfeeding and Lactation consult Severe Preeclampsia.  Significant diuresis.  Mg level previously low normal.  Increased to 2.5 mg. Pt given Hydralazine IV push now.  Changed Aldomet to TID. Breast pump to bed side. Potassium level pending. Will check out to Dr. Richardson Dopp this am.   LOS: 2 days   Karina [name] 05/03/2011, 6:47 AM

## 2011-05-03 NOTE — Progress Notes (Signed)
UR chart review completed.  

## 2011-05-04 ENCOUNTER — Encounter (HOSPITAL_COMMUNITY): Payer: Self-pay | Admitting: *Deleted

## 2011-05-04 MED ORDER — METHYLDOPA 500 MG PO TABS
500.0000 mg | ORAL_TABLET | Freq: Two times a day (BID) | ORAL | Status: DC
  Administered 2011-05-04: 500 mg via ORAL

## 2011-05-04 MED ORDER — BENZOCAINE-MENTHOL 20-0.5 % EX AERO
INHALATION_SPRAY | CUTANEOUS | Status: AC
  Administered 2011-05-04: 1 via TOPICAL
  Filled 2011-05-04: qty 56

## 2011-05-04 MED ORDER — METHYLDOPA 500 MG PO TABS
500.0000 mg | ORAL_TABLET | Freq: Two times a day (BID) | ORAL | Status: DC
  Filled 2011-05-04 (×2): qty 1

## 2011-05-04 MED ORDER — METHYLDOPA 500 MG PO TABS
500.0000 mg | ORAL_TABLET | Freq: Three times a day (TID) | ORAL | Status: DC
  Administered 2011-05-04 – 2011-05-05 (×2): 500 mg via ORAL
  Filled 2011-05-04 (×2): qty 1

## 2011-05-04 NOTE — Anesthesia Postprocedure Evaluation (Signed)
Patient stable following vaginal delivery.  

## 2011-05-04 NOTE — Progress Notes (Addendum)
Post Partum Day 1 Subjective: no complaints  Objective: Blood pressure 129/83, pulse 97, temperature 98.2 F (36.8 C), temperature source Oral, resp. rate 18, height 5\' 5"  (1.651 m), weight 102.967 kg (227 lb), last menstrual period 09/04/2010, SpO2 97.00%, unknown if currently breastfeeding.  Physical Exam:  General: no distress Lochia: appropriate Uterine Fundus: firm Episiotomy, laceration : no significant drainage DVT Evaluation: No evidence of DVT seen on physical exam.   Basename 05/03/11 0614 05/02/11 2110  HGB 10.7* 12.0  HCT 32.1* 35.2*    Assessment/Plan: Plan for discharge tomorrow   LOS: 3 days   GREENE,ELEANOR E 05/04/2011, 8:46 PM

## 2011-05-04 NOTE — Progress Notes (Signed)
Returned from NICU via w/c.  Pt sitting up in the chair by the bed.  Motrin 600 mg po given per pt c/o mild cramping.

## 2011-05-04 NOTE — Progress Notes (Signed)
Weakness improving with ambulation

## 2011-05-05 LAB — COMPREHENSIVE METABOLIC PANEL
ALT: 10 U/L (ref 0–35)
Alkaline Phosphatase: 102 U/L (ref 39–117)
CO2: 27 mEq/L (ref 19–32)
Calcium: 9.8 mg/dL (ref 8.4–10.5)
GFR calc Af Amer: >60 mL/min (ref >60)
GFR calc non Af Amer: >60 mL/min (ref >60)
Glucose, Bld: 122 mg/dL — ABNORMAL HIGH (ref 70–99)
Sodium: 136 mEq/L (ref 135–145)

## 2011-05-05 MED ORDER — METHYLDOPA 500 MG PO TABS
500.0000 mg | ORAL_TABLET | Freq: Four times a day (QID) | ORAL | Status: DC
  Administered 2011-05-05 – 2011-05-06 (×5): 500 mg via ORAL
  Filled 2011-05-05 (×10): qty 1

## 2011-05-05 NOTE — Progress Notes (Signed)
Post Partum Day 2 Subjective: edema hands ankles No c/0 headache, n & V, epigastric pain  Objective: Blood pressure 159/114, pulse 58, temperature 97.6 F (36.4 C), temperature source Oral, resp. rate 18, height 5\' 5"  (1.651 m), weight 102.967 kg (227 lb), last menstrual period 09/04/2010, SpO2 100.00%, unknown if currently breastfeeding.  Physical Exam:  General: alert and no distress Lochia: appropriate Uterine Fundus: firm Episiotomy, laceration : no significant drainage DVT Evaluation: No evidence of DVT seen on physical exam. Mild edema of hands and ankles   Basename 05/03/11 0614 05/02/11 2110  HGB 10.7* 12.0  HCT 32.1* 35.2*    Assessment/Plan: PIH, stable BP still up today, so will not discharge  Plan for discharge tomorrow if stable Continue rest Adjust BP meds as needed. Repeat labs   LOS: 4 days   Zachary Lovins E 05/05/2011, 11:24 AM

## 2011-05-05 NOTE — Progress Notes (Signed)
SW attempted to meet with MOB to complete assessment, but she was resting and asked me to come back at a later time.  She states she she is doing fine. 

## 2011-05-06 LAB — COMPREHENSIVE METABOLIC PANEL
ALT: 12 U/L (ref 0–35)
AST: 20 U/L (ref 0–37)
Albumin: 2.4 g/dL — ABNORMAL LOW (ref 3.5–5.2)
CO2: 28 mEq/L (ref 19–32)
Calcium: 9.9 mg/dL (ref 8.4–10.5)
Chloride: 100 mEq/L (ref 96–112)
Creatinine, Ser: 0.8 mg/dL (ref 0.50–1.10)
Sodium: 136 mEq/L (ref 135–145)
Total Bilirubin: 0.1 mg/dL — ABNORMAL LOW (ref 0.3–1.2)

## 2011-05-06 LAB — CBC
MCV: 86.5 fL (ref 78.0–100.0)
Platelets: 308 10*3/uL (ref 150–400)
RBC: 3.79 MIL/uL — ABNORMAL LOW (ref 3.87–5.11)
RDW: 14.1 % (ref 11.5–15.5)
WBC: 6 10*3/uL (ref 4.0–10.5)

## 2011-05-06 MED ORDER — SIMETHICONE 80 MG PO CHEW: 80.0000 mg | CHEWABLE_TABLET | ORAL | Status: DC | PRN

## 2011-05-06 MED ORDER — IBUPROFEN 600 MG PO TABS: 600.0000 mg | ORAL_TABLET | Freq: Four times a day (QID) | ORAL | Status: DC

## 2011-05-06 MED ORDER — MEASLES, MUMPS & RUBELLA VAC ~~LOC~~ INJ: 0.5000 mL | INJECTION | Freq: Once | SUBCUTANEOUS | Status: DC

## 2011-05-06 MED ORDER — ONDANSETRON HCL 4 MG/2ML IJ SOLN: 4.0000 mg | INTRAMUSCULAR | Status: DC | PRN

## 2011-05-06 MED ORDER — PRENATAL PLUS 27-1 MG PO TABS: 1.0000 | ORAL_TABLET | Freq: Every day | ORAL | Status: DC

## 2011-05-06 MED ORDER — ONDANSETRON HCL 4 MG PO TABS: 4.0000 mg | ORAL_TABLET | ORAL | Status: DC | PRN

## 2011-05-06 MED ORDER — DIPHENHYDRAMINE HCL 25 MG PO CAPS: 25.0000 mg | ORAL_CAPSULE | Freq: Four times a day (QID) | ORAL | Status: DC | PRN

## 2011-05-06 MED ORDER — BENZOCAINE-MENTHOL 20-0.5 % EX AERO: 1.0000 "application " | INHALATION_SPRAY | CUTANEOUS | Status: DC | PRN

## 2011-05-06 MED ORDER — SENNOSIDES-DOCUSATE SODIUM 8.6-50 MG PO TABS: 2.0000 | ORAL_TABLET | Freq: Every day | ORAL | Status: DC

## 2011-05-06 MED ORDER — DIBUCAINE 1 % RE OINT: 1.0000 "application " | TOPICAL_OINTMENT | RECTAL | Status: DC | PRN

## 2011-05-06 MED ORDER — LANOLIN HYDROUS EX OINT: TOPICAL_OINTMENT | CUTANEOUS | Status: DC | PRN

## 2011-05-06 MED ORDER — TETANUS-DIPHTH-ACELL PERTUSSIS 5-2.5-18.5 LF-MCG/0.5 IM SUSP: 0.5000 mL | Freq: Once | INTRAMUSCULAR | Status: DC

## 2011-05-06 MED ORDER — ZOLPIDEM TARTRATE 5 MG PO TABS: 5.0000 mg | ORAL_TABLET | Freq: Every evening | ORAL | Status: DC | PRN

## 2011-05-06 MED ORDER — MEDROXYPROGESTERONE ACETATE 150 MG/ML IM SUSP: 150.0000 mg | INTRAMUSCULAR | Status: DC | PRN

## 2011-05-06 MED ORDER — OXYCODONE-ACETAMINOPHEN 5-325 MG PO TABS: 1.0000 | ORAL_TABLET | ORAL | Status: DC | PRN

## 2011-05-06 MED ORDER — WITCH HAZEL-GLYCERIN EX PADS: 1.0000 "application " | MEDICATED_PAD | CUTANEOUS | Status: DC | PRN

## 2011-05-06 NOTE — Progress Notes (Signed)
Post Partum Day 3 Subjective: no complaints  Objective: Blood pressure 159/104, pulse 56, temperature 97.8 F (36.6 C), temperature source Oral, resp. rate 20, height 5\' 5"  (1.651 m), weight 105.28 kg (232 lb 1.6 oz), last menstrual period 09/04/2010, SpO2 100.00%, unknown if currently breastfeeding.  Physical Exam:  General: alert and no distress Lochia: appropriate Uterine Fundus: firm Episiotomy, laceration : no significant drainage DVT Evaluation: No evidence of DVT seen on physical exam.  No results found for this basename: HGB:2,HCT:2 in the last 72 hours  Assessment/Plan: Discharge home   LOS: 5 days   Kyle Luppino E 05/06/2011, 12:41 PM

## 2011-05-06 NOTE — Discharge Summary (Signed)
Obstetric Discharge Summary Reason for Admission: observation/evaluation and pregnancy induced hypertension Prenatal Procedures: cerclage removal Intrapartum Procedures: spontaneous vaginal delivery Postpartum Procedures: none Complications-Operative and Postpartum: none  Hemoglobin  Date Value Range Status  05/03/2011 10.7* 12.0-15.0 (g/dL) Final     HCT  Date Value Range Status  05/03/2011 32.1* 36.0-46.0 (%) Final    Discharge Diagnoses: Term Pregnancy-delivered  Discharge Information: Date: 05/06/2011 Activity: pelvic rest Diet: routine Medications: Tylenol #3, aldomet 500mg  q 6 hrs, kcl 30 mg qd, pnv w iron qd Condition: stable Instructions: refer to practice specific booklet Discharge to: home   Newborn Data: Live born  Information for the patient's newborn:  Daylee, Delahoz [[id number]]  female ; APGAR , ; weight ;  Home with mother.  Thursa Emme E 05/06/2011, 12:42 PM

## 2011-05-06 NOTE — Progress Notes (Signed)
D/c instructions reviewed with pt., pt states understanding of same, no home equipment needed, ambulated to car with staff without incident, d/c'd home with family.

## 2012-05-21 ENCOUNTER — Emergency Department (HOSPITAL_BASED_OUTPATIENT_CLINIC_OR_DEPARTMENT_OTHER)
Admission: EM | Admit: 2012-05-21 | Discharge: 2012-05-21 | Disposition: A | Discharge status: Home / Self Care | Payer: BC Managed Care – PPO | Attending: Emergency Medicine | Admitting: Emergency Medicine

## 2012-05-21 ENCOUNTER — Emergency Department (HOSPITAL_BASED_OUTPATIENT_CLINIC_OR_DEPARTMENT_OTHER): Payer: BC Managed Care – PPO

## 2012-05-21 DIAGNOSIS — Z3490 Encounter for supervision of normal pregnancy, unspecified, unspecified trimester: Secondary | ICD-10-CM

## 2012-05-21 DIAGNOSIS — O2390 Unspecified genitourinary tract infection in pregnancy, unspecified trimester: Secondary | ICD-10-CM | POA: Insufficient documentation

## 2012-05-21 DIAGNOSIS — N390 Urinary tract infection, site not specified: Secondary | ICD-10-CM

## 2012-05-21 LAB — PREGNANCY, URINE: Preg Test, Ur: POSITIVE — AB

## 2012-05-21 LAB — URINE MICROSCOPIC-ADD ON

## 2012-05-21 LAB — URINALYSIS, ROUTINE W REFLEX MICROSCOPIC
Glucose, UA: NEGATIVE mg/dL
Hgb urine dipstick: NEGATIVE
pH: 6.5 (ref 5.0–8.0)

## 2012-05-21 MED ORDER — PRENATAL COMPLETE 14-0.4 MG PO TABS: 1.0000 | ORAL_TABLET | Freq: Every morning | ORAL | Status: DC

## 2012-05-21 MED ORDER — AMOXICILLIN 500 MG PO CAPS: 500.0000 mg | ORAL_CAPSULE | Freq: Three times a day (TID) | ORAL | Status: DC

## 2012-05-21 NOTE — ED Notes (Signed)
GYN cart at bedside.

## 2012-05-21 NOTE — ED Provider Notes (Signed)
History     CSN: [id number]  Arrival date & time 05/21/12  1801   First MD Initiated Contact with Patient 05/21/12 1841      Chief Complaint  Patient presents with  . Abdominal Pain    (Consider location/radiation/quality/duration/timing/severity/associated sxs/prior treatment) Patient is a 26 y.o. female presenting with cramps. The history is provided by the patient. No language interpreter was used.  Abdominal Cramping The primary symptoms of the illness include abdominal pain. Episode onset: 3 days. The problem has not changed since onset. Pregnant now: lost track of last period.  Pt reports urinary frequency.   Pt reports she may be pregnant  Past Medical History  Diagnosis Date  . Fibroids   . Trichomonas     Past Surgical History  Procedure Date  . Dilation and curettage of uterus     No family history on file.  History  Substance Use Topics  . Smoking status: Former Games developer  . Smokeless tobacco: Never Used  . Alcohol Use: No    OB History    Grav Para Term Preterm Abortions TAB SAB Ect Mult Living   4 1 0 1 3 0 3 0 0 1       Review of Systems  Gastrointestinal: Positive for abdominal pain.  All other systems reviewed and are negative.    Allergies  Review of patient's allergies indicates no known allergies.  Home Medications  No current outpatient prescriptions on file.  BP 139/89  Pulse 73  Temp 97.7 F (36.5 C)  Resp 18  Wt 207 lb (93.895 kg)  SpO2 100%  LMP 04/10/2012  Physical Exam  Nursing note and vitals reviewed. Constitutional: She is oriented to person, place, and time. She appears well-developed and well-nourished.  HENT:  Head: Normocephalic and atraumatic.  Eyes: Conjunctivae normal and EOM are normal. Pupils are equal, round, and reactive to light.  Neck: Normal range of motion. Neck supple.  Cardiovascular: Normal rate.   Pulmonary/Chest: Effort normal and breath sounds normal.  Abdominal: Soft. There is no tenderness.    Genitourinary:       Pt declined pelvic  Musculoskeletal: Normal range of motion.  Neurological: She is alert and oriented to person, place, and time. She has normal reflexes.  Skin: Skin is warm.  Psychiatric: She has a normal mood and affect.    ED Course  Procedures (including critical care time)  Labs Reviewed  URINALYSIS, ROUTINE W REFLEX MICROSCOPIC - Abnormal; Notable for the following:    APPearance CLOUDY (*)     Leukocytes, UA SMALL (*)     All other components within normal limits  PREGNANCY, URINE - Abnormal; Notable for the following:    Preg Test, Ur POSITIVE (*)     All other components within normal limits  URINE MICROSCOPIC-ADD ON - Abnormal; Notable for the following:    Squamous Epithelial / LPF MANY (*)     Bacteria, UA FEW (*)     All other components within normal limits  WET PREP, GENITAL  GC/CHLAMYDIA PROBE AMP, GENITAL   No results found.   1. Pregnancy   2. UTI (lower urinary tract infection)       MDM  I counseled pt on positive pregnancy.   I will treat uti.   Pt agrees to see Dr. Neva Seat for follow up,  Repeat ultrasound and prenatal care.          Lonia Skinner [place], [place] 05/21/12 2101

## 2012-05-21 NOTE — ED Provider Notes (Signed)
Medical screening examination/treatment/procedure(s) were performed by non-physician practitioner and as supervising physician I was immediately available for consultation/collaboration.   Justine Dines, MD 05/21/12 2342 

## 2012-05-21 NOTE — ED Notes (Signed)
Patient here with intermittent llq pain x 3 days, reports urinary frequency for same. Pain worse with ambulation

## 2012-06-11 ENCOUNTER — Other Ambulatory Visit: Payer: Self-pay | Admitting: Obstetrics & Gynecology

## 2012-06-11 ENCOUNTER — Other Ambulatory Visit (HOSPITAL_COMMUNITY)
Admission: RE | Admit: 2012-06-11 | Discharge: 2012-06-11 | Disposition: A | Discharge status: Home / Self Care | Payer: BC Managed Care – PPO | Source: Ambulatory Visit | Attending: Obstetrics & Gynecology | Admitting: Obstetrics & Gynecology

## 2012-06-11 DIAGNOSIS — Z3480 Encounter for supervision of other normal pregnancy, unspecified trimester: Secondary | ICD-10-CM | POA: Insufficient documentation

## 2012-06-11 DIAGNOSIS — Z124 Encounter for screening for malignant neoplasm of cervix: Secondary | ICD-10-CM | POA: Insufficient documentation

## 2012-06-11 DIAGNOSIS — Z113 Encounter for screening for infections with a predominantly sexual mode of transmission: Secondary | ICD-10-CM | POA: Insufficient documentation

## 2012-07-20 ENCOUNTER — Encounter (HOSPITAL_BASED_OUTPATIENT_CLINIC_OR_DEPARTMENT_OTHER): Payer: Self-pay | Admitting: *Deleted

## 2012-07-20 ENCOUNTER — Emergency Department (HOSPITAL_BASED_OUTPATIENT_CLINIC_OR_DEPARTMENT_OTHER): Payer: BC Managed Care – PPO

## 2012-07-20 ENCOUNTER — Observation Stay (HOSPITAL_BASED_OUTPATIENT_CLINIC_OR_DEPARTMENT_OTHER)
Admission: EM | Admit: 2012-07-20 | Discharge: 2012-07-23 | Disposition: A | Discharge status: Home / Self Care | Payer: BC Managed Care – PPO | Attending: Emergency Medicine | Admitting: Emergency Medicine

## 2012-07-20 DIAGNOSIS — J029 Acute pharyngitis, unspecified: Principal | ICD-10-CM | POA: Insufficient documentation

## 2012-07-20 DIAGNOSIS — L0211 Cutaneous abscess of neck: Secondary | ICD-10-CM

## 2012-07-20 DIAGNOSIS — R51 Headache: Secondary | ICD-10-CM | POA: Insufficient documentation

## 2012-07-20 DIAGNOSIS — M6580 Other synovitis and tenosynovitis, unspecified site: Secondary | ICD-10-CM | POA: Insufficient documentation

## 2012-07-20 LAB — CBC WITH DIFFERENTIAL/PLATELET
Basophils Relative: 0 % (ref 0–1)
Eosinophils Absolute: 0.2 10*3/uL (ref 0.0–0.7)
MCH: 31.2 pg (ref 26.0–34.0)
MCHC: 36.8 g/dL — ABNORMAL HIGH (ref 30.0–36.0)
Neutrophils Relative %: 67 % (ref 43–77)
Platelets: 359 10*3/uL (ref 150–400)

## 2012-07-20 LAB — BASIC METABOLIC PANEL
BUN: 5 mg/dL — ABNORMAL LOW (ref 6–23)
Calcium: 9.3 mg/dL (ref 8.4–10.5)
GFR calc Af Amer: >90 mL/min (ref >90)
GFR calc non Af Amer: >90 mL/min (ref >90)
Potassium: 3.8 mEq/L (ref 3.5–5.1)
Sodium: 134 mEq/L — ABNORMAL LOW (ref 135–145)

## 2012-07-20 MED ORDER — IOHEXOL 300 MG/ML  SOLN
80.0000 mL | Freq: Once | INTRAMUSCULAR | Status: AC | PRN
  Administered 2012-07-20: 80 mL via INTRAVENOUS

## 2012-07-20 MED ORDER — ONDANSETRON HCL 4 MG/2ML IJ SOLN
4.0000 mg | Freq: Once | INTRAMUSCULAR | Status: AC
  Administered 2012-07-20: 4 mg via INTRAVENOUS
  Filled 2012-07-20: qty 2

## 2012-07-20 MED ORDER — SODIUM CHLORIDE 0.9 % IV SOLN
INTRAVENOUS | Status: DC
  Administered 2012-07-20: 22:00:00 via INTRAVENOUS

## 2012-07-20 MED ORDER — HYDROMORPHONE HCL PF 1 MG/ML IJ SOLN
1.0000 mg | Freq: Once | INTRAMUSCULAR | Status: AC
  Administered 2012-07-20: 1 mg via INTRAVENOUS
  Filled 2012-07-20: qty 1

## 2012-07-20 MED ORDER — SODIUM CHLORIDE 0.9 % IV BOLUS (SEPSIS)
1000.0000 mL | Freq: Once | INTRAVENOUS | Status: AC
  Administered 2012-07-20: 1000 mL via INTRAVENOUS

## 2012-07-20 NOTE — ED Notes (Signed)
Report given to Gwinda Maine, RN CN at [facility] ED.

## 2012-07-20 NOTE — ED Notes (Addendum)
MD aware that pt is requesting further pain medications. Orders received.  

## 2012-07-20 NOTE — ED Notes (Addendum)
Pt states she woke with neck pain x 3 days ago- pain worse today- states she went to [facility] and was given flexeril- pain is now in her throat- pain is worse with movement- speaking in full sentences- pt is 14 weeks preg- due date 01-19-2013- pt appears uncomfortable, bracing neck with her hand in triage

## 2012-07-20 NOTE — ED Provider Notes (Signed)
History  This chart was scribed for Karina Jakes, MD by Donne Anon, ED Scribe, and Bennett Scrape, ED Scribe.. This patient was seen in room MH10/MH10 and the patient's care was started at 7:48 PM.   CSN: [id number]  Arrival date & time 07/20/12  1919   First MD Initiated Contact with Patient 07/20/12 1948      Chief Complaint  Patient presents with  . Neck Pain     Patient is a 26 y.o. female presenting with neck pain. The history is provided by the patient. No language interpreter was used.  Neck Pain  This is a new problem. The current episode started more than 2 days ago. The problem occurs constantly. The problem has been gradually worsening. There has been no fever. The pain is present in the generalized neck. The pain does not radiate. The pain is severe. The symptoms are aggravated by swallowing and position. The pain is the same all the time. Associated symptoms include headaches (mild HA). Pertinent negatives include no chest pain. She has tried muscle relaxants for the symptoms. The treatment provided no relief.    Karina [name] is a 26 y.o. female who presents to the Emergency Department complaining of sharp, constant, worsening neck pain, which began upon awakening 3 days ago. Her pain started in the right posterior region of her neck, and began radiating today towards the front of the neck, especially of the left. She was seen at [facility] earlier today and given Flexeril, which she states has not helped. She reports associated headache, pain with swallowing and breathing, along with feeling like her throat is closing. She denies fever, chest pain, nausea, vomiting, abdominal pain, vaginal bleeding, back pain, and problems urinating. She has not had a previous similar episode.   She is [redacted] weeks pregnant and is being seen by [facility].  Past Medical History  Diagnosis Date  . Fibroids   . Trichomonas   . Pregnancy     Past Surgical History    Procedure Date  . Dilation and curettage of uterus   . Cervical cerclage     No family history on file.  History  Substance Use Topics  . Smoking status: Former Games developer  . Smokeless tobacco: Never Used  . Alcohol Use: No    OB History    Grav Para Term Preterm Abortions TAB SAB Ect Mult Living   5 1 0 1 3 0 3 0 0 1       Review of Systems  Constitutional: Negative for fever.  HENT: Positive for sore throat and neck pain. Negative for rhinorrhea.   Respiratory: Negative for cough.   Cardiovascular: Negative for chest pain.  Gastrointestinal: Negative for nausea, vomiting, abdominal pain and diarrhea.  Genitourinary: Negative for vaginal bleeding and difficulty urinating.  Musculoskeletal: Negative for back pain.  Neurological: Positive for headaches (mild HA). Negative for dizziness.  All other systems reviewed and are negative.    Allergies  Review of patient's allergies indicates no known allergies.  Home Medications   Current Outpatient Rx  Name  Route  Sig  Dispense  Refill  . CYCLOBENZAPRINE HCL 10 MG PO TABS   Oral   Take 10 mg by mouth 3 (three) times daily as needed.         . AMOXICILLIN 500 MG PO CAPS   Oral   Take 1 capsule (500 mg total) by mouth 3 (three) times daily.   30 capsule   0   .  PRENATAL COMPLETE 14-0.4 MG PO TABS   Oral   Take 1 tablet by mouth every morning.   30 each   1     Triage Vitals: BP 140/88  Pulse 96  Temp 97.5 F (36.4 C) (Oral)  Resp 22  Ht 5\' 5"  (1.651 m)  Wt 215 lb (97.523 kg)  BMI 35.78 kg/m2  SpO2 99%  LMP 04/10/2012  Breastfeeding? No  Physical Exam  Nursing note and vitals reviewed. Constitutional: She is oriented to person, place, and time. She appears well-developed and well-nourished. No distress.  HENT:  Head: Normocephalic and atraumatic.  Mouth/Throat: Oropharynx is clear and moist.       Uvula is midline, no exudate, and no erythema. Tenderness with palpitation with sternocleidomastoid   muscle, worse on the left. Tenderness with palpation to the right posterior neck No spasms, masses or adenopathy noted.   Eyes: Conjunctivae normal and EOM are normal. Pupils are equal, round, and reactive to light.  Neck: Neck supple. No tracheal deviation present.  Cardiovascular: Normal rate, regular rhythm and normal heart sounds.   No murmur heard. Pulmonary/Chest: Effort normal and breath sounds normal. No respiratory distress.  Abdominal: Soft. Bowel sounds are normal. There is no tenderness.  Genitourinary:       Fetal heart tones are 144.  Musculoskeletal: Normal range of motion. She exhibits no edema.  Lymphadenopathy:    She has no cervical adenopathy.  Neurological: She is alert and oriented to person, place, and time. No cranial nerve deficit.       Pt able to move both sets of fingers and toes   Skin: Skin is warm and dry.  Psychiatric: She has a normal mood and affect. Her behavior is normal.    ED Course  Procedures (including critical care time)  DIAGNOSTIC STUDIES: Oxygen Saturation is 99% on room air, normal by my interpretation.    COORDINATION OF CARE: 8:26PM: Discussed treatment plan which includes neck CT scan with contrast and IV fluids with pt at bedside and pt agreed to plan.  8:30 PM-Ordered 1 mg Dilaudid injection, 4 mg Zofran injection and 1,000 mL of Bolus  Labs Reviewed  CBC WITH DIFFERENTIAL - Abnormal; Notable for the following:    MCHC 36.8 (*)     Monocytes Relative 13 (*)     Monocytes Absolute 1.2 (*)     All other components within normal limits  BASIC METABOLIC PANEL - Abnormal; Notable for the following:    Sodium 134 (*)     BUN 5 (*)     All other components within normal limits  RAPID STREP SCREEN   Ct Soft Tissue Neck W Contrast  07/20/2012  *RADIOLOGY REPORT*  Clinical Data: Severe constant neck and throat pain for 3 days, aggravated by swallowing and positioning.  CT NECK WITH CONTRAST  Technique:  Multidetector CT imaging of  the neck was performed with intravenous contrast.  Contrast: 80mL OMNIPAQUE IOHEXOL 300 MG/ML  SOLN  Comparison: Cervical spine radiographs performed 04/14/2010  Findings: There is a prominent prevertebral soft tissue abscess, measuring approximately 6.9 x 2.5 x 0.9 cm.  This demonstrates some mass effect on the hypopharynx, though the hypopharynx remains grossly patent.  Mild surrounding soft tissue inflammation is noted, though the parapharyngeal fat planes are preserved, and the palatine tonsils appear grossly unremarkable.  The adenoids may be slightly prominent, without definite evidence for significant inflammation. No cervical lymphadenopathy is identified; right-sided cervical nodes are borderline normal in size.  The valleculae and  piriform sinuses appear grossly intact.  The abscess extends from the level of vertebral body C2 to the inferior aspect of C5; the proximal esophagus is not well characterized, though it is expected to be about the inferior aspect of the abscess.  The thyroid gland is unremarkable in appearance.  The superior mediastinum is grossly unremarkable.  No vascular compromise is characterized.  The visualized portions of the brain are unremarkable in appearance.  The orbits are within normal limits. There is mild partial opacification of the frontal sinuses; the remaining paranasal sinuses and mastoid air cells are well-aerated.  No acute osseous abnormalities are seen.  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  The visualized lung apices are clear.  IMPRESSION:  1.  Prominent 6.9 x 2.5 x 0.9 cm prevertebral soft tissue abscess noted, demonstrating some mass effect on the hypopharynx, though the hypopharynx remains grossly patent.  This is in the danger space, though it does not yet extend to the mediastinum.  It extends from C2 to  the inferior aspect of C5.  The proximal esophagus is not well characterized, though it is expected to be about the inferior aspect of the abscess. 2.  Mild surrounding soft tissue inflammation noted; however, the tonsils appear grossly unremarkable, and no significant cervical lymphadenopathy is seen. 3.  Mild partial opacification of the frontal sinuses. 4.  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  These results were called by telephone on 07/20/2012 at 10:52 p.m. to Dr. Vanetta Mulders, who verbally acknowledged these results.   Original Report Authenticated By: Tonia Ghent, M.D.    Results for orders placed during the hospital encounter of 07/20/12  CBC WITH DIFFERENTIAL      Component Value Range   WBC 9.4  4.0 - 10.5 K/uL   RBC 4.29  3.87 - 5.11 MIL/uL   Hemoglobin 13.4  12.0 - 15.0 g/dL   HCT 13.0  86.5 - 78.4 %   MCV 84.8  78.0 - 100.0 fL   MCH 31.2  26.0 - 34.0 pg   MCHC 36.8 (*) 30.0 - 36.0 g/dL   RDW 69.6  29.5 - 28.4 %   Platelets 359  150 - 400 K/uL   Neutrophils Relative 67  43 - 77 %   Neutro Abs 6.3  1.7 - 7.7 K/uL   Lymphocytes Relative 18  12 - 46 %   Lymphs Abs 1.7  0.7 - 4.0 K/uL   Monocytes Relative 13 (*) 3 - 12 %   Monocytes Absolute 1.2 (*) 0.1 - 1.0 K/uL   Eosinophils Relative 2  0 - 5 %   Eosinophils Absolute 0.2  0.0 - 0.7 K/uL   Basophils Relative 0  0 - 1 %   Basophils Absolute 0.0  0.0 - 0.1 K/uL  BASIC METABOLIC PANEL      Component Value Range   Sodium 134 (*) 135 - 145 mEq/L   Potassium 3.8  3.5 - 5.1 mEq/L   Chloride 101  96 - 112 mEq/L   CO2 21  19 - 32 mEq/L   Glucose, Bld 93  70 - 99 mg/dL   BUN 5 (*) 6 - 23 mg/dL   Creatinine, Ser 1.32  0.50 - 1.10 mg/dL  Calcium 9.3  8.4 - 10.5 mg/dL   GFR calc non Af Amer >90  >90 mL/min   GFR calc Af Amer >90  >90 mL/min  RAPID STREP SCREEN      Component Value Range    Streptococcus, Group A Screen (Direct) NEGATIVE  NEGATIVE     1. Neck abscess       MDM  CT findings consistent with a prevertebral abscess from C2-C5. However patient is not febrile no leukocytosis not toxic. Discussed with on call ear nose and throat Dr. Jenne Pane who feels that he needs to see her in consultation at [place] will transfer her via [facility]. He did not want to initiate antibiotics at this time and I won't initiate steroids at this time. No significant airway compromise noted on the CT scan. This may just be an inflammatory process. They are aware that she is [redacted] weeks pregnant. Good fetal heart tones here no vaginal bleeding no abdominal pain.  Patient's pain improves some with hydromorphone in the ED. Patient still swallowing well. Vital signs are normal patient is not even tachycardic.  I personally performed the services described in this documentation, which was scribed in my presence. The recorded information has been reviewed and is accurate.         Karina Jakes, MD 07/20/12 [phone]

## 2012-07-21 ENCOUNTER — Encounter (HOSPITAL_COMMUNITY): Payer: Self-pay

## 2012-07-21 MED ORDER — PRENATAL MULTIVITAMIN CH
1.0000 | ORAL_TABLET | Freq: Every day | ORAL | Status: DC
  Administered 2012-07-21 – 2012-07-23 (×2): 1 via ORAL
  Filled 2012-07-21 (×3): qty 1

## 2012-07-21 MED ORDER — IBUPROFEN 800 MG PO TABS
800.0000 mg | ORAL_TABLET | Freq: Three times a day (TID) | ORAL | Status: DC
  Filled 2012-07-21: qty 1

## 2012-07-21 MED ORDER — PRENATAL COMPLETE 14-0.4 MG PO TABS: 1.0000 | ORAL_TABLET | Freq: Every morning | ORAL | Status: DC

## 2012-07-21 MED ORDER — KCL IN DEXTROSE-NACL 20-5-0.45 MEQ/L-%-% IV SOLN
INTRAVENOUS | Status: DC
  Administered 2012-07-21 – 2012-07-22 (×3): via INTRAVENOUS
  Filled 2012-07-21 (×5): qty 1000

## 2012-07-21 MED ORDER — DEXAMETHASONE SODIUM PHOSPHATE 10 MG/ML IJ SOLN
10.0000 mg | Freq: Once | INTRAMUSCULAR | Status: AC
  Administered 2012-07-21: 10 mg via INTRAVENOUS
  Filled 2012-07-21: qty 1

## 2012-07-21 MED ORDER — IBUPROFEN 800 MG PO TABS
800.0000 mg | ORAL_TABLET | Freq: Three times a day (TID) | ORAL | Status: AC
  Administered 2012-07-21 – 2012-07-22 (×3): 800 mg via ORAL
  Filled 2012-07-21 (×3): qty 1

## 2012-07-21 MED ORDER — HYDROMORPHONE HCL PF 1 MG/ML IJ SOLN
1.0000 mg | Freq: Once | INTRAMUSCULAR | Status: AC
  Administered 2012-07-21: 1 mg via INTRAVENOUS
  Filled 2012-07-21: qty 1

## 2012-07-21 MED ORDER — MORPHINE SULFATE 4 MG/ML IJ SOLN
4.0000 mg | INTRAMUSCULAR | Status: DC | PRN
  Administered 2012-07-21: 4 mg via INTRAVENOUS
  Filled 2012-07-21: qty 1

## 2012-07-21 NOTE — ED Notes (Signed)
Transported to [facility] ED. Pt. Stable at transfer.

## 2012-07-21 NOTE — ED Notes (Signed)
Please call  Elder Love: Sister [phone] with any updates.

## 2012-07-21 NOTE — ED Notes (Signed)
Pt  Transferred from [facility]. A.O. X 4. Reports neck stiffness and sore throst in neck x 3 days. Pain 10/10. Denies SOB. Complains of nausea. No vomiting. Pt is [redacted] weeks pregnant.  Due 01/19/13. + fetal pulse established prior to transfer. Ambulates  With steady gait. Updated on plane of care. Aware that ENT consult has been ordered and will be completed possibly tonight or in the morning. No further needs at this time.  Respirations even and regular. Skin warm, dry, and intact.

## 2012-07-21 NOTE — H&P (Signed)
Karina [name] is an 26 y.o. female.   Chief Complaint: neck pain HPI: 26 year old female who is [redacted] weeks pregnant with three days of neck pain and stiffness on the left side that limits motion.  No fever, chills, sweats.  Presented to [facility] ER yesterday and treated with Flexeril and discharged.  Pain did not improve on Flexeril but instead, worsened.  Pain began to involve the throat and swallowing became difficult.  Presented to [facility] where CT scan performed suggesting retropharyngeal abscess.  Transferred to [facility] ER.  After my review of the CT and discussion with the reading radiologist, the interpretation was amended to likely represent calcific prevertebral tendonitis.  She has received no medications overnight in the ER besides Dilaudid and IV fluid.  Pain is no better and no worse.  She still cannot drink liquids well.  Past Medical History  Diagnosis Date  . Fibroids   . Trichomonas   . Pregnancy     Past Surgical History  Procedure Date  . Dilation and curettage of uterus   . Cervical cerclage     History reviewed. No pertinent family history. Social History:  reports that she has quit smoking. She has never used smokeless tobacco. She reports that she does not drink alcohol or use illicit drugs.  Allergies: No Known Allergies   (Not in a hospital admission)  Results for orders placed during the hospital encounter of 07/20/12 (from the past 48 hour(s))  CBC WITH DIFFERENTIAL     Status: Abnormal   Collection Time   07/20/12  8:30 PM      Component Value Range Comment   WBC 9.4  4.0 - 10.5 K/uL    RBC 4.29  3.87 - 5.11 MIL/uL    Hemoglobin 13.4  12.0 - 15.0 g/dL    HCT 16.1  09.6 - 04.5 %    MCV 84.8  78.0 - 100.0 fL    MCH 31.2  26.0 - 34.0 pg    MCHC 36.8 (*) 30.0 - 36.0 g/dL    RDW 40.9  81.1 - 91.4 %    Platelets 359  150 - 400 K/uL    Neutrophils Relative 67  43 - 77 %    Neutro Abs 6.3  1.7 - 7.7 K/uL    Lymphocytes Relative 18   12 - 46 %    Lymphs Abs 1.7  0.7 - 4.0 K/uL    Monocytes Relative 13 (*) 3 - 12 %    Monocytes Absolute 1.2 (*) 0.1 - 1.0 K/uL    Eosinophils Relative 2  0 - 5 %    Eosinophils Absolute 0.2  0.0 - 0.7 K/uL    Basophils Relative 0  0 - 1 %    Basophils Absolute 0.0  0.0 - 0.1 K/uL   BASIC METABOLIC PANEL     Status: Abnormal   Collection Time   07/20/12  8:30 PM      Component Value Range Comment   Sodium 134 (*) 135 - 145 mEq/L    Potassium 3.8  3.5 - 5.1 mEq/L    Chloride 101  96 - 112 mEq/L    CO2 21  19 - 32 mEq/L    Glucose, Bld 93  70 - 99 mg/dL    BUN 5 (*) 6 - 23 mg/dL    Creatinine, Ser 7.82  0.50 - 1.10 mg/dL    Calcium 9.3  8.4 - 95.6 mg/dL    GFR  calc non Af Amer >90  >90 mL/min    GFR calc Af Amer >90  >90 mL/min   RAPID STREP SCREEN     Status: Normal   Collection Time   07/20/12  8:52 PM      Component Value Range Comment   Streptococcus, Group A Screen (Direct) NEGATIVE  NEGATIVE    Ct Soft Tissue Neck W Contrast  07/20/2012  **ADDENDUM** CREATED: 07/20/2012 23:47:17  On further evaluation and correlation with the patient's clinical findings, note is made of calcification within the superior tendons of the longus colli, at C1-C2.  Per the literature, and given the lack of significant surrounding enhancement with respect to the apparent fluid tracking along the prevertebral soft tissues, this could very well reflect a retropharyngeal effusion in conjunction with acute calcific prevertebral tendinitis, rather than an abscess.  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  **END ADDENDUM** SIGNED BY: Tonia Ghent, M.D.   07/20/2012  *RADIOLOGY REPORT*  Clinical Data: Severe constant neck and throat pain for 3 days, aggravated by swallowing and positioning.  CT  NECK WITH CONTRAST  Technique:  Multidetector CT imaging of the neck was performed with intravenous contrast.  Contrast: 80mL OMNIPAQUE IOHEXOL 300 MG/ML  SOLN  Comparison: Cervical spine radiographs performed 04/14/2010  Findings: There is a prominent prevertebral soft tissue abscess, measuring approximately 6.9 x 2.5 x 0.9 cm.  This demonstrates some mass effect on the hypopharynx, though the hypopharynx remains grossly patent.  Mild surrounding soft tissue inflammation is noted, though the parapharyngeal fat planes are preserved, and the palatine tonsils appear grossly unremarkable.  The adenoids may be slightly prominent, without definite evidence for significant inflammation. No cervical lymphadenopathy is identified; right-sided cervical nodes are borderline normal in size.  The valleculae and piriform sinuses appear grossly intact.  The abscess extends from the level of vertebral body C2 to the inferior aspect of C5; the proximal esophagus is not well characterized, though it is expected to be about the inferior aspect of the abscess.  The thyroid gland is unremarkable in appearance.  The superior mediastinum is grossly unremarkable.  No vascular compromise is characterized.  The visualized portions of the brain are unremarkable in appearance.  The orbits are within normal limits. There is mild partial opacification of the frontal sinuses; the remaining paranasal sinuses and mastoid air cells are well-aerated.  No acute osseous abnormalities are seen.  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  The visualized lung apices are clear.  IMPRESSION:  1.  Prominent 6.9 x 2.5 x 0.9 cm prevertebral soft tissue abscess noted, demonstrating some mass effect on the hypopharynx, though the hypopharynx remains grossly patent.  This is in the danger space, though it  does not yet extend to the mediastinum.  It extends from C2 to the inferior aspect of C5.  The proximal esophagus is not well characterized, though it is expected to be about the inferior aspect of the abscess. 2.  Mild surrounding soft tissue inflammation noted; however, the tonsils appear grossly unremarkable, and no significant cervical lymphadenopathy is seen. 3.  Mild partial opacification of the frontal sinuses. 4.  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  These results were called by telephone on 07/20/2012 at 10:52 p.m. to Dr. Vanetta Mulders, who verbally acknowledged these results.  Original Report Authenticated By: Tonia Ghent, M.D.     Review of Systems  HENT: Positive for sore throat.   All other systems reviewed and are negative.    Blood pressure 135/100, pulse 94, temperature 98.2 F (36.8 C), temperature source Oral, resp. rate 18, height 5\' 5"  (1.651 m), weight 97.523 kg (215 lb), last menstrual period 04/10/2012, SpO2 100.00%, not currently breastfeeding. Physical Exam  Constitutional: She is oriented to person, place, and time. She appears well-developed and well-nourished. No distress.  HENT:  Head: Normocephalic and atraumatic.  Right Ear: External ear normal.  Left Ear: External ear normal.  Nose: Nose normal.  Mouth/Throat: Oropharynx is clear and moist.       Voice minimally muffled.  No stridor.  Handling secretions.  Eyes: Conjunctivae normal and EOM are normal. Pupils are equal, round, and reactive to light.  Neck:       Limited range of motion.  Neck not particularly tender.  Normal laryngeal movement.  No crepitance.  No neck fullness.  Cardiovascular: Normal rate.   Respiratory: Effort normal.  GI:       Did not examine.  Genitourinary:       Did not examine.  Musculoskeletal: Normal range of motion.  Neurological: She is alert  and oriented to person, place, and time. No cranial nerve deficit.  Skin: Skin is warm and dry.  Psychiatric: She has a normal mood and affect. Her behavior is normal. Judgment and thought content normal.     Assessment/Plan Prevertebral tendonitis - the CT appearance is classic with prevertebral edema and calcification of the left-sided longus capitis muscle. Will admit to hospital for IV fluids.  The typical treatment for this problem is NSAIDs but, being pregnant, this therapy versus steroid therapy will be discussed with her OB/GYN, Dr. Rudean Haskell, before proceeding.  With her normal WBC and lack of fever, I feel that infection/abscess is unlikely.  Patrisia Faeth 07/21/2012, 6:27 AM

## 2012-07-21 NOTE — ED Provider Notes (Signed)
Pt transferred from [facility] for neck pain. Pt has been having neck stiffness, and pain x 3 days, worse today, with associated sore throat. Pain worse with swallowing. No hoarseness in voice, no difficulty swallowing, no difficulty controlling secretions or with breathing. Pt had a CT soft tissue that was read as an abscess, Dr. Jenne Pane from ENT was consulted and he discussed the case with radiologist and the read was addended to possible tendinitis with some fluid as well.  Pt seen at [facility]. Noticed to have severe neck pain, and stiffness. Pain worse with any movement. No crepitus, no true cervical lymphadenopathy, no trismus, and tenderness worse with palpation of paraspinal muscles and also sodt tissue of the anterior neck. She has no stridor, no respiratory distress and no fevers.  The exam is equivocal, and with the change in CT read - even that is equivocal for deep infection. I discussed the case with Dr. Jenne Pane after patients arrival.  He feels comfortable coming into the ER early in the morning and evaluating her..... And if she decompensates he will come in right away. He thinks patient will benefit from steroids, but given her pregnancy, i rather have him evaluate her before committing to any meds including steroids.  Will monitor periodically, and every time she asks for pain meds.  Derwood Kaplan, MD 07/21/12 [phone]

## 2012-07-21 NOTE — Progress Notes (Signed)
Asked at 1142 pm to d/c patient via phone by Dr. Jenne Pane who wanted EDP to d/c patient from [facility] as he felt sx were due to tendonitis, EDP stated she was uncomfortable with this as previous attending felt it was necessary.  Seen and examined [facility] EOMI Patient unable to move neck in any direction and has slight bulge in the left posterior lateral neck.   RRR CTAB  Ao3  BP 81/36 will recheck. Agree with Dr. Darlyn Chamber note that patient needs to be transferred to [facility] to be seen by ENT at [facility] and will likely require admission due to pain.  Patient completely unable to move neck in any direction and states pain medication is not helping her pain and she has never had this problem in the past.   BP recheck 133/95   1215 am case d/w Dr. Rhunette Croft following exam of patient, Stated patient needs to be seen by ENT.

## 2012-07-21 NOTE — ED Notes (Signed)
Per [facility]:  Pt from med center, pt sent to see Dr. Jenne Pane (ENT) for abnormal CT results.  EDP [facility] notified.

## 2012-07-21 NOTE — ED Notes (Addendum)
Report given to [facility] Jerrye Beavers)

## 2012-07-21 NOTE — Progress Notes (Signed)
Patient ID: Karina [name], female   DOB: 1986/04/13, 26 y.o.   MRN: [id number] I was called about the patient by Dr. Deretha Emory from [facility] with the report of a prevertebral abscess but with normal white blood count and no fever. We discussed transfer to [facility] for further evaluation and potential surgery.  I then personally reviewed her neck CT which shows prevertebral space widening with no rim enhancement or cervical lymphadenopathy and with an amorphous calcified area anterior to the second cervical vertebra.  These findings are classic for prevertebral tendonitis, which would fit her clinical picture much better.  I discussed my interpretation with Dr. Cherly Hensen, the reading radiologist, who agreed with the findings.  I then tried to discuss this updated diagnosis with Dr. Saralyn Pilar at [facility], since Dr. Deretha Emory had gone off shift, but she had not seen the patient and did not have interest in discussing any change of plan.  Thus, I spoke to the ER physician at [facility] who had accepted the patient in transfer and explained the situation.  She expressed understanding.  I suggested that the patient could be discharged on a course of prednisone (NSAIDs are not to be used in pregnancy) and that I would be happy to see the patient tomorrow in the office.  She plans to discuss the situation with Dr. Saralyn Pilar.  If the patient is transferred anyway, I said that I would be happy to see the patient in the morning.

## 2012-07-22 LAB — CBC WITH DIFFERENTIAL/PLATELET
Basophils Absolute: 0 10*3/uL (ref 0.0–0.1)
Eosinophils Absolute: 0 10*3/uL (ref 0.0–0.7)
Lymphs Abs: 1 10*3/uL (ref 0.7–4.0)
MCH: 31.3 pg (ref 26.0–34.0)
Neutrophils Relative %: 77 % (ref 43–77)
Platelets: 346 10*3/uL (ref 150–400)
RBC: 4.18 MIL/uL (ref 3.87–5.11)
RDW: 13.6 % (ref 11.5–15.5)
WBC: 8.7 10*3/uL (ref 4.0–10.5)

## 2012-07-22 MED ORDER — ACETAMINOPHEN 80 MG PO CHEW
320.0000 mg | CHEWABLE_TABLET | ORAL | Status: DC | PRN
  Filled 2012-07-22: qty 4

## 2012-07-22 MED ORDER — DEXAMETHASONE SODIUM PHOSPHATE 10 MG/ML IJ SOLN
10.0000 mg | Freq: Three times a day (TID) | INTRAMUSCULAR | Status: DC
  Filled 2012-07-22 (×2): qty 1

## 2012-07-22 MED ORDER — IBUPROFEN 800 MG PO TABS
800.0000 mg | ORAL_TABLET | ORAL | Status: DC | PRN
  Administered 2012-07-22 – 2012-07-23 (×2): 800 mg via ORAL
  Filled 2012-07-22: qty 1

## 2012-07-22 MED ORDER — MORPHINE SULFATE 2 MG/ML IJ SOLN: 2.0000 mg | INTRAMUSCULAR | Status: DC | PRN

## 2012-07-22 MED ORDER — ACETAMINOPHEN 160 MG/5ML PO SOLN
320.0000 mg | Freq: Four times a day (QID) | ORAL | Status: DC | PRN
  Administered 2012-07-23: 320 mg via ORAL
  Filled 2012-07-22: qty 20.3

## 2012-07-22 MED ORDER — ONDANSETRON HCL 4 MG/2ML IJ SOLN: 4.0000 mg | Freq: Four times a day (QID) | INTRAMUSCULAR | Status: DC | PRN

## 2012-07-22 MED ORDER — ONDANSETRON 4 MG PO TBDP
4.0000 mg | ORAL_TABLET | Freq: Four times a day (QID) | ORAL | Status: DC | PRN
  Filled 2012-07-22: qty 1

## 2012-07-22 MED ORDER — DEXAMETHASONE SODIUM PHOSPHATE 4 MG/ML IJ SOLN
12.0000 mg | Freq: Once | INTRAMUSCULAR | Status: AC
  Administered 2012-07-22: 12 mg via INTRAVENOUS
  Filled 2012-07-22 (×2): qty 3

## 2012-07-22 NOTE — Progress Notes (Signed)
Subjective: Feeling a bit better today.  Still not taking much by mouth due to throat pain.  Has had some difficulty keeping ibuprofen down.  Objective: Vital signs in last 24 hours: Temp:  [97.7 F (36.5 C)-98.4 F (36.9 C)] 97.7 F (36.5 C) (11/26 0623) Pulse Rate:  [81-94] 81  (11/26 0623) Resp:  [18] 18  (11/26 0623) BP: (114-135)/(72-89) 126/80 mmHg (11/26 0623) SpO2:  [100 %] 100 % (11/26 0623) Last BM Date: 07/20/12  Intake/Output from previous day: 11/25 0701 - 11/26 0700 In: 2217 [P.O.:120; I.V.:2097] Out: -  Intake/Output this shift:    General appearance: alert, cooperative and no distress Neck: Normal voice, no stridor.  Limited neck ROM.  Lab Results:   Basename 07/22/12 0705 07/20/12 2030  WBC 8.7 9.4  HGB 13.1 13.4  HCT 36.7 36.4  PLT 346 359   BMET  Basename 07/20/12 2030  NA 134*  K 3.8  CL 101  CO2 21  GLUCOSE 93  BUN 5*  CREATININE 0.60  CALCIUM 9.3   PT/INR No results found for this basename: LABPROT:2,INR:2 in the last 72 hours ABG No results found for this basename: PHART:2,PCO2:2,PO2:2,HCO3:2 in the last 72 hours  Studies/Results: Ct Soft Tissue Neck W Contrast  07/20/2012  **ADDENDUM** CREATED: 07/20/2012 23:47:17  On further evaluation and correlation with the patient's clinical findings, note is made of calcification within the superior tendons of the longus colli, at C1-C2.  Per the literature, and given the lack of significant surrounding enhancement with respect to the apparent fluid tracking along the prevertebral soft tissues, this could very well reflect a retropharyngeal effusion in conjunction with acute calcific prevertebral tendinitis, rather than an abscess.  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  **END  ADDENDUM** SIGNED BY: Tonia Ghent, M.D.   07/20/2012  *RADIOLOGY REPORT*  Clinical Data: Severe constant neck and throat pain for 3 days, aggravated by swallowing and positioning.  CT NECK WITH CONTRAST  Technique:  Multidetector CT imaging of the neck was performed with intravenous contrast.  Contrast: 80mL OMNIPAQUE IOHEXOL 300 MG/ML  SOLN  Comparison: Cervical spine radiographs performed 04/14/2010  Findings: There is a prominent prevertebral soft tissue abscess, measuring approximately 6.9 x 2.5 x 0.9 cm.  This demonstrates some mass effect on the hypopharynx, though the hypopharynx remains grossly patent.  Mild surrounding soft tissue inflammation is noted, though the parapharyngeal fat planes are preserved, and the palatine tonsils appear grossly unremarkable.  The adenoids may be slightly prominent, without definite evidence for significant inflammation. No cervical lymphadenopathy is identified; right-sided cervical nodes are borderline normal in size.  The valleculae and piriform sinuses appear grossly intact.  The abscess extends from the level of vertebral body C2 to the inferior aspect of C5; the proximal esophagus is not well characterized, though it is expected to be about the inferior aspect of the abscess.  The thyroid gland is unremarkable in appearance.  The superior mediastinum is grossly unremarkable.  No vascular compromise is characterized.  The visualized portions of the brain are unremarkable in appearance.  The orbits are within normal limits. There is mild partial opacification of the frontal sinuses; the remaining paranasal sinuses and mastoid air cells are well-aerated.  No acute osseous abnormalities are seen.  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  The visualized lung apices are clear.  IMPRESSION:  1.  Prominent 6.9 x 2.5  x 0.9 cm prevertebral soft tissue abscess noted, demonstrating some mass effect on the hypopharynx, though the hypopharynx remains grossly patent.  This is in the danger space, though it does not yet extend to the mediastinum.  It extends from C2 to the inferior aspect of C5.  The proximal esophagus is not well characterized, though it is expected to be about the inferior aspect of the abscess. 2.  Mild surrounding soft tissue inflammation noted; however, the tonsils appear grossly unremarkable, and no significant cervical lymphadenopathy is seen. 3.  Mild partial opacification of the frontal sinuses. 4.  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  These results were called by telephone on 07/20/2012 at 10:52 p.m. to Dr. Vanetta Mulders, who verbally acknowledged these results.  Original Report Authenticated By: Tonia Ghent, M.D.     Anti-infectives: Anti-infectives    None      Assessment/Plan: Prevertebral tendonitis Progressing slowly, as expected.  Two week course of high-strength ibuprofen and treatment with IV steroids were both approved specifically by her OB/GYN, Dr. Chilton Si, yesterday, by phone.  Continue course.  Will give another dose of Decadron today.  Discharge will be considered when she can tolerate po fluids well enough to stay hydrated.  LOS: 2 days    Karina [name] 07/22/2012

## 2012-07-22 NOTE — Progress Notes (Signed)
Pt has no IV access, refused IV restart,MD aware.

## 2012-07-23 NOTE — Discharge Summary (Signed)
07/23/2012 8:24 AM  Date of Admission: 07/21/12 Date of Discharge:07/23/2012  Discharge MD: Melvenia Beam  Admitting MD: Christia Reading, MD  Reason for admission/final discharge diagnosis: sore throat, prevertebral calcific tendonitis  Labs: H/H normal at time of discharge  Procedure(s) performed:none  Discharge Condition:improved  Discharge Exam:oral cavity clear with no edema or erythema. Midline uvula. Cn 2-12 intact and symmetric, EOMI, PERRLA, normal voice  Discharge Instructions: regular diet as tolerated, drink plenty of fluids. Your Rx for Ibuprofen, amoxicillin, and Zofran are on your chart. follow up with  Dr. Jenne Pane At [facility] in 2 weeks.  Hospital Course: admitted on 07/21/12 after presenting to ER for sore throat. Ct scan initially thought to show retropharyngeal abscess but with lack of rim enhancement and calcification of left longus colli muscle thought to be more consistent with prevertebral calcific tendonitis. Patient is pregnant. Patient was admitted for Iv hydration, antiinflammatories and IV steroids. She improved clinically and was discharged in improved condition and taking good PO on HD#3 (07/23/12).  Melvenia Beam 8:24 AM 07/23/2012

## 2012-07-23 NOTE — Progress Notes (Signed)
Patient discharged to home with instructions. 

## 2012-07-23 NOTE — Progress Notes (Signed)
Patient 's fetal heart rate checked, 160 bpm by doppler.

## 2012-07-28 LAB — OB RESULTS CONSOLE HIV ANTIBODY (ROUTINE TESTING): HIV: NONREACTIVE

## 2012-07-28 LAB — OB RESULTS CONSOLE RPR: RPR: NONREACTIVE

## 2012-08-11 ENCOUNTER — Other Ambulatory Visit: Payer: Self-pay | Admitting: Obstetrics and Gynecology

## 2012-08-11 DIAGNOSIS — O09299 Supervision of pregnancy with other poor reproductive or obstetric history, unspecified trimester: Secondary | ICD-10-CM

## 2012-08-13 ENCOUNTER — Ambulatory Visit (HOSPITAL_COMMUNITY)
Admission: RE | Admit: 2012-08-13 | Discharge: 2012-08-13 | Disposition: A | Discharge status: Home / Self Care | Payer: BC Managed Care – PPO | Source: Ambulatory Visit | Attending: Obstetrics and Gynecology | Admitting: Obstetrics and Gynecology

## 2012-08-13 ENCOUNTER — Encounter (HOSPITAL_COMMUNITY): Payer: Self-pay

## 2012-08-13 ENCOUNTER — Other Ambulatory Visit: Payer: Self-pay | Admitting: Obstetrics and Gynecology

## 2012-08-13 DIAGNOSIS — Z363 Encounter for antenatal screening for malformations: Secondary | ICD-10-CM | POA: Insufficient documentation

## 2012-08-13 DIAGNOSIS — O09299 Supervision of pregnancy with other poor reproductive or obstetric history, unspecified trimester: Secondary | ICD-10-CM

## 2012-08-13 DIAGNOSIS — O3440 Maternal care for other abnormalities of cervix, unspecified trimester: Secondary | ICD-10-CM | POA: Insufficient documentation

## 2012-08-13 DIAGNOSIS — O358XX0 Maternal care for other (suspected) fetal abnormality and damage, not applicable or unspecified: Secondary | ICD-10-CM | POA: Insufficient documentation

## 2012-08-13 DIAGNOSIS — Z1389 Encounter for screening for other disorder: Secondary | ICD-10-CM | POA: Insufficient documentation

## 2012-08-13 DIAGNOSIS — Z9889 Other specified postprocedural states: Secondary | ICD-10-CM

## 2012-08-13 DIAGNOSIS — O2620 Pregnancy care for patient with recurrent pregnancy loss, unspecified trimester: Secondary | ICD-10-CM | POA: Insufficient documentation

## 2012-08-22 ENCOUNTER — Ambulatory Visit (HOSPITAL_COMMUNITY): Admission: RE | Admit: 2012-08-22 | Payer: BC Managed Care – PPO | Source: Ambulatory Visit

## 2012-08-28 ENCOUNTER — Ambulatory Visit (HOSPITAL_COMMUNITY)
Admission: RE | Admit: 2012-08-28 | Discharge: 2012-08-28 | Disposition: A | Discharge status: Home / Self Care | Payer: Medicaid Other | Source: Ambulatory Visit | Attending: Obstetrics and Gynecology | Admitting: Obstetrics and Gynecology

## 2012-08-28 DIAGNOSIS — Z8751 Personal history of pre-term labor: Secondary | ICD-10-CM | POA: Insufficient documentation

## 2012-08-28 DIAGNOSIS — O09299 Supervision of pregnancy with other poor reproductive or obstetric history, unspecified trimester: Secondary | ICD-10-CM | POA: Insufficient documentation

## 2012-08-28 DIAGNOSIS — O2620 Pregnancy care for patient with recurrent pregnancy loss, unspecified trimester: Secondary | ICD-10-CM | POA: Insufficient documentation

## 2012-08-28 DIAGNOSIS — O3440 Maternal care for other abnormalities of cervix, unspecified trimester: Secondary | ICD-10-CM | POA: Insufficient documentation

## 2012-08-28 NOTE — Progress Notes (Signed)
Karina [name]  was seen today for an ultrasound appointment.  See full report in [facility].  Impression: Single IUP at 20 0/7 weeks Ultrasound for cervical length only  TVUS - Cervical length of 3.5 cm without funneling or dynamic changes  Recommendations: Recommend follow up ultrasound for cervical length and growth/ anatomy next week. Continue weekly CL until 22 weeks and every other week until 28 weeks.  Alpha Gula, MD

## 2012-09-05 ENCOUNTER — Ambulatory Visit (HOSPITAL_COMMUNITY): Admission: RE | Admit: 2012-09-05 | Payer: BC Managed Care – PPO | Source: Ambulatory Visit

## 2012-09-11 ENCOUNTER — Ambulatory Visit (HOSPITAL_COMMUNITY)
Admission: RE | Admit: 2012-09-11 | Discharge: 2012-09-11 | Disposition: A | Discharge status: Home / Self Care | Payer: BC Managed Care – PPO | Source: Ambulatory Visit | Attending: Obstetrics and Gynecology | Admitting: Obstetrics and Gynecology

## 2012-09-11 VITALS — BP 126/85 | HR 81 | Wt 219.0 lb

## 2012-09-11 DIAGNOSIS — O2620 Pregnancy care for patient with recurrent pregnancy loss, unspecified trimester: Secondary | ICD-10-CM | POA: Insufficient documentation

## 2012-09-11 DIAGNOSIS — Z9889 Other specified postprocedural states: Secondary | ICD-10-CM

## 2012-09-11 DIAGNOSIS — O09299 Supervision of pregnancy with other poor reproductive or obstetric history, unspecified trimester: Secondary | ICD-10-CM | POA: Insufficient documentation

## 2012-09-11 DIAGNOSIS — Z3689 Encounter for other specified antenatal screening: Secondary | ICD-10-CM | POA: Insufficient documentation

## 2012-09-11 DIAGNOSIS — Z8751 Personal history of pre-term labor: Secondary | ICD-10-CM | POA: Insufficient documentation

## 2012-09-11 DIAGNOSIS — O3440 Maternal care for other abnormalities of cervix, unspecified trimester: Secondary | ICD-10-CM | POA: Insufficient documentation

## 2012-09-11 NOTE — Progress Notes (Signed)
Staci Acosta  was seen today for an ultrasound appointment.  See full report in [facility].  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  TVUS - cervical length 3.3 cm without funneling or dynamic changed.  Recommendations: Recommend follow-up ultrasound examination for cervical length in 2 weeks - will continue cervical length screening until [redacted] weeks gestation.  Alpha Gula, MD

## 2012-09-25 ENCOUNTER — Ambulatory Visit (HOSPITAL_COMMUNITY): Payer: BC Managed Care – PPO

## 2012-09-26 ENCOUNTER — Encounter (HOSPITAL_COMMUNITY): Payer: Self-pay

## 2012-09-26 ENCOUNTER — Ambulatory Visit (HOSPITAL_COMMUNITY)
Admission: RE | Admit: 2012-09-26 | Discharge: 2012-09-26 | Disposition: A | Discharge status: Home / Self Care | Payer: Medicaid Other | Source: Ambulatory Visit | Attending: Obstetrics and Gynecology | Admitting: Obstetrics and Gynecology

## 2012-09-26 ENCOUNTER — Ambulatory Visit (HOSPITAL_COMMUNITY): Payer: BC Managed Care – PPO

## 2012-09-26 DIAGNOSIS — Z8751 Personal history of pre-term labor: Secondary | ICD-10-CM | POA: Insufficient documentation

## 2012-09-26 DIAGNOSIS — O09299 Supervision of pregnancy with other poor reproductive or obstetric history, unspecified trimester: Secondary | ICD-10-CM | POA: Insufficient documentation

## 2012-09-26 DIAGNOSIS — O2620 Pregnancy care for patient with recurrent pregnancy loss, unspecified trimester: Secondary | ICD-10-CM | POA: Insufficient documentation

## 2012-09-26 DIAGNOSIS — O3440 Maternal care for other abnormalities of cervix, unspecified trimester: Secondary | ICD-10-CM | POA: Insufficient documentation

## 2012-10-03 ENCOUNTER — Other Ambulatory Visit: Payer: Self-pay | Admitting: Obstetrics and Gynecology

## 2012-10-03 DIAGNOSIS — O2620 Pregnancy care for patient with recurrent pregnancy loss, unspecified trimester: Secondary | ICD-10-CM

## 2012-10-03 DIAGNOSIS — Z8751 Personal history of pre-term labor: Secondary | ICD-10-CM

## 2012-10-03 DIAGNOSIS — O3440 Maternal care for other abnormalities of cervix, unspecified trimester: Secondary | ICD-10-CM

## 2012-10-03 DIAGNOSIS — O09299 Supervision of pregnancy with other poor reproductive or obstetric history, unspecified trimester: Secondary | ICD-10-CM

## 2012-10-13 ENCOUNTER — Ambulatory Visit (HOSPITAL_COMMUNITY)
Admission: RE | Admit: 2012-10-13 | Discharge: 2012-10-13 | Disposition: A | Discharge status: Home / Self Care | Payer: Medicaid Other | Source: Ambulatory Visit | Attending: Obstetrics and Gynecology | Admitting: Obstetrics and Gynecology

## 2012-10-13 ENCOUNTER — Other Ambulatory Visit: Payer: Self-pay | Admitting: Obstetrics and Gynecology

## 2012-10-13 DIAGNOSIS — O09299 Supervision of pregnancy with other poor reproductive or obstetric history, unspecified trimester: Secondary | ICD-10-CM | POA: Insufficient documentation

## 2012-10-13 DIAGNOSIS — O2620 Pregnancy care for patient with recurrent pregnancy loss, unspecified trimester: Secondary | ICD-10-CM | POA: Insufficient documentation

## 2012-10-13 DIAGNOSIS — Z8751 Personal history of pre-term labor: Secondary | ICD-10-CM

## 2012-10-13 DIAGNOSIS — O3440 Maternal care for other abnormalities of cervix, unspecified trimester: Secondary | ICD-10-CM

## 2012-10-13 NOTE — Progress Notes (Signed)
Karina [name]  was seen today for an ultrasound appointment.  See full report in [facility].  Impression: Single IUP at 26 4/7 weeks Normal interval anatomy Estimated fetal weight today is at the 23rd %tile; the [facility] measures at the 9th %tile (asymmetic growth) Normal amniotic fluid volume  TVUS - a cervical length of 2.9 cm was noted.  No funneling or dynamic changes appreciated  Recommendations: Follow up growth and cervical length in 3 weeks.  Alpha Gula, MD

## 2012-10-30 ENCOUNTER — Other Ambulatory Visit: Payer: Self-pay | Admitting: Obstetrics and Gynecology

## 2012-10-30 DIAGNOSIS — O09299 Supervision of pregnancy with other poor reproductive or obstetric history, unspecified trimester: Secondary | ICD-10-CM

## 2012-11-03 ENCOUNTER — Encounter (HOSPITAL_COMMUNITY): Payer: Self-pay | Admitting: *Deleted

## 2012-11-03 ENCOUNTER — Ambulatory Visit (HOSPITAL_COMMUNITY)
Admission: RE | Admit: 2012-11-03 | Discharge: 2012-11-03 | Disposition: A | Discharge status: Home / Self Care | Payer: Medicaid Other | Source: Ambulatory Visit | Attending: Obstetrics and Gynecology | Admitting: Obstetrics and Gynecology

## 2012-11-03 ENCOUNTER — Other Ambulatory Visit: Payer: Self-pay | Admitting: Obstetrics and Gynecology

## 2012-11-03 ENCOUNTER — Inpatient Hospital Stay (HOSPITAL_COMMUNITY)
Admission: AD | Admit: 2012-11-03 | Discharge: 2012-11-03 | Disposition: A | Discharge status: Home / Self Care | Payer: Medicaid Other | Source: Ambulatory Visit | Attending: Obstetrics and Gynecology | Admitting: Obstetrics and Gynecology

## 2012-11-03 VITALS — BP 132/98 | HR 85 | Wt 221.0 lb

## 2012-11-03 DIAGNOSIS — O139 Gestational [pregnancy-induced] hypertension without significant proteinuria, unspecified trimester: Secondary | ICD-10-CM

## 2012-11-03 DIAGNOSIS — O2620 Pregnancy care for patient with recurrent pregnancy loss, unspecified trimester: Secondary | ICD-10-CM | POA: Insufficient documentation

## 2012-11-03 DIAGNOSIS — O09299 Supervision of pregnancy with other poor reproductive or obstetric history, unspecified trimester: Secondary | ICD-10-CM | POA: Insufficient documentation

## 2012-11-03 DIAGNOSIS — O3440 Maternal care for other abnormalities of cervix, unspecified trimester: Secondary | ICD-10-CM | POA: Insufficient documentation

## 2012-11-03 DIAGNOSIS — Z8751 Personal history of pre-term labor: Secondary | ICD-10-CM | POA: Insufficient documentation

## 2012-11-03 HISTORY — DX: Essential (primary) hypertension: I10

## 2012-11-03 LAB — URINALYSIS, ROUTINE W REFLEX MICROSCOPIC
Glucose, UA: NEGATIVE mg/dL
Hgb urine dipstick: NEGATIVE
Specific Gravity, Urine: 1.01 (ref 1.005–1.030)
pH: 6.5 (ref 5.0–8.0)

## 2012-11-03 LAB — LACTATE DEHYDROGENASE: LDH: 136 U/L (ref 94–250)

## 2012-11-03 LAB — URINE MICROSCOPIC-ADD ON

## 2012-11-03 LAB — CBC
MCH: 30.6 pg (ref 26.0–34.0)
MCV: 87.8 fL (ref 78.0–100.0)
Platelets: 275 10*3/uL (ref 150–400)
RDW: 13.5 % (ref 11.5–15.5)
WBC: 9.5 10*3/uL (ref 4.0–10.5)

## 2012-11-03 LAB — COMPREHENSIVE METABOLIC PANEL
AST: 11 U/L (ref 0–37)
Albumin: 2.7 g/dL — ABNORMAL LOW (ref 3.5–5.2)
Calcium: 9.3 mg/dL (ref 8.4–10.5)
Creatinine, Ser: 0.63 mg/dL (ref 0.50–1.10)

## 2012-11-03 LAB — URIC ACID: Uric Acid, Serum: 5.2 mg/dL (ref 2.4–7.0)

## 2012-11-03 NOTE — MAU Note (Signed)
Sent from MFM for further evaluation of hypertension

## 2012-11-03 NOTE — MAU Provider Note (Signed)
History     CSN: [id number]  Arrival date and time: 11/03/12 1301   None     Chief Complaint  Patient presents with  . Hypertension   HPI This is a 27 y.o. female at [redacted]w[redacted]d who was sent here by MFM due to elevated BP. They ordered PIH labs. Pregnancy has been remarkable for history of cervical incompetence with prior pregnancy, requiring a cerclage which was removed for IOL for severe preeclampsia. Reports BPs are normal outside of pregnancy.   Recommendations by MFM today: Recommendations:  1. Lagging fetal growth:  - previously counseled  - appropriate interval fetal growth  - recommend fetal growth in 3 weeks  2. History of PPROM at 18 weeks:  - previously counseled  - normal transvaginal cervical length  - on 17OH progesterone  - no further transvaginal cervical lengths indicated unless symptoms develop  - recommend close surveillance for the development of signs/symptoms of preterm labor/PPROM  3. History of preeclampsia:  - previously counseled  - patient had 2 mild range blood pressures in our office today  - discussed increased risk of gestational hypertension and preeclampsia given history  - recommend patient go to MAU for lab work (AST, ALT, CBC, BUN, creatinine, uric acid) and serial blood pressures  - recommend 24 hour urine (may be done as an outpatient if blood pressures in normal to mild range, labs normal, and patient asymptomatic)  - if patient meets criteria for preeclampsia or gestational hypertesion recommend a course of betamethasone and start antenatal testing- either twice weekly nonstress tests and weekly AFI or weekly biophysical profiles; would also consider weekly Doppler studies if meets criteria  - recommend close surveillance of blood pressures (at least weekly) and for signs/symptoms of preeclampsia  The above was discussed with Dr. Christell Constant and the patient was sent to MAU  Eulis Foster, MD  OB History   Grav Para Term Preterm Abortions TAB SAB  Ect Mult Living   5 1 0 1 3 0 3 0 0 1       Past Medical History  Diagnosis Date  . Fibroids   . Trichomonas   . Pregnancy   . Hypertension     Past Surgical History  Procedure Laterality Date  . Dilation and curettage of uterus    . Cervical cerclage      Family History  Problem Relation Age of Onset  . Hypertension Mother   . Hypertension Father     History  Substance Use Topics  . Smoking status: Former Games developer  . Smokeless tobacco: Never Used  . Alcohol Use: No    Allergies: No Known Allergies  Prescriptions prior to admission  Medication Sig Dispense Refill  . acetaminophen (TYLENOL) 325 MG tablet Take 650 mg by mouth daily as needed for pain.      . Prenatal Vit-Fe Fumarate-FA (PRENATAL MULTIVITAMIN) TABS Take 1 tablet by mouth daily at 12 noon.        Review of Systems  Constitutional: Negative for fever, chills and malaise/fatigue.  Eyes: Negative for blurred vision and double vision.  Cardiovascular: Negative for chest pain.  Gastrointestinal: Negative for nausea, vomiting, abdominal pain, diarrhea and constipation.  Genitourinary: Negative for dysuria.  Neurological: Negative for dizziness, focal weakness, weakness and headaches.   Physical Exam   Blood pressure 125/69, pulse 84, temperature 97.3 F (36.3 C), temperature source Oral, resp. rate 16, last menstrual period 04/10/2012, unknown if currently breastfeeding.  Physical Exam  Constitutional: She is oriented to person, place, and  time. She appears well-developed and well-nourished. No distress.  Cardiovascular: Normal rate, regular rhythm and normal heart sounds.   Respiratory: Effort normal and breath sounds normal. No respiratory distress. She has no wheezes. She has no rales.  GI: Soft. She exhibits no distension and no mass. There is no tenderness. There is no rebound and no guarding.  Musculoskeletal: Normal range of motion. She exhibits no edema and no tenderness.  Neurological: She is  alert and oriented to person, place, and time. She has normal reflexes.  Skin: Skin is warm and dry.  Psychiatric: She has a normal mood and affect.   FHR reactive No contractions  Filed Vitals:   11/03/12 1318 11/03/12 1508 11/03/12 1510  BP: 138/98 136/93 125/69  Pulse: 89 78 84  Temp: 97.3 F (36.3 C)    TempSrc: Oral    Resp: 16     Other BPs:   139/93, 144/93, 136/93, 125/69 119/65 mmHg -- --  114/69 mmHg --  -- 119/71 mmHg (all on her left side position)  Results for orders placed during the hospital encounter of 11/03/12 (from the past 24 hour(s))  CBC     Status: Abnormal   Collection Time    11/03/12  1:45 PM      Result Value Range   WBC 9.5  4.0 - 10.5 K/uL   RBC 3.95  3.87 - 5.11 MIL/uL   Hemoglobin 12.1  12.0 - 15.0 g/dL   HCT 52.8 (*) 41.3 - 24.4 %   MCV 87.8  78.0 - 100.0 fL   MCH 30.6  26.0 - 34.0 pg   MCHC 34.9  30.0 - 36.0 g/dL   RDW 01.0  27.2 - 53.6 %   Platelets 275  150 - 400 K/uL  COMPREHENSIVE METABOLIC PANEL     Status: Abnormal   Collection Time    11/03/12  1:45 PM      Result Value Range   Sodium 133 (*) 135 - 145 mEq/L   Potassium 3.5  3.5 - 5.1 mEq/L   Chloride 100  96 - 112 mEq/L   CO2 23  19 - 32 mEq/L   Glucose, Bld 75  70 - 99 mg/dL   BUN 5 (*) 6 - 23 mg/dL   Creatinine, Ser 6.44  0.50 - 1.10 mg/dL   Calcium 9.3  8.4 - 03.4 mg/dL   Total Protein 6.6  6.0 - 8.3 g/dL   Albumin 2.7 (*) 3.5 - 5.2 g/dL   AST 11  0 - 37 U/L   ALT 5  0 - 35 U/L   Alkaline Phosphatase 118 (*) 39 - 117 U/L   Total Bilirubin 0.2 (*) 0.3 - 1.2 mg/dL   GFR calc non Af Amer >90  >90 mL/min   GFR calc Af Amer >90  >90 mL/min  URIC ACID     Status: None   Collection Time    11/03/12  1:45 PM      Result Value Range   Uric Acid, Serum 5.2  2.4 - 7.0 mg/dL  LACTATE DEHYDROGENASE     Status: None   Collection Time    11/03/12  1:45 PM      Result Value Range   LDH 136  94 - 250 U/L  URINALYSIS, ROUTINE W REFLEX MICROSCOPIC     Status: Abnormal    Collection Time    11/03/12  2:22 PM      Result Value Range   Color, Urine YELLOW  YELLOW  APPearance HAZY (*) CLEAR   Specific Gravity, Urine 1.010  1.005 - 1.030   pH 6.5  5.0 - 8.0   Glucose, UA NEGATIVE  NEGATIVE mg/dL   Hgb urine dipstick NEGATIVE  NEGATIVE   Bilirubin Urine NEGATIVE  NEGATIVE   Ketones, ur NEGATIVE  NEGATIVE mg/dL   Protein, ur NEGATIVE  NEGATIVE mg/dL   Urobilinogen, UA 0.2  0.0 - 1.0 mg/dL   Nitrite NEGATIVE  NEGATIVE   Leukocytes, UA MODERATE (*) NEGATIVE  URINE MICROSCOPIC-ADD ON     Status: Abnormal   Collection Time    11/03/12  2:22 PM      Result Value Range   Squamous Epithelial / LPF MANY (*) RARE   WBC, UA 11-20  <3 WBC/hpf   Bacteria, UA FEW (*) RARE    MAU Course  Procedures  MDM Discussed with Dr Christell Constant, Will get more BPs and if below 150/100, will d/c home to do 24 hr urine to be turned in tomorrow. To be seen in office Thursday morning.   Assessment and Plan  A:  SIUP at [redacted]w[redacted]d       Gestational Hypertension       No evidence of preeclampsia at this time  P:  Discharge home       To turn in 24 hr urine tomorrow       To office Thursday morning.        Preeclampsia precautions  WILLIAMS,MARIE 11/03/2012, 3:41 PM

## 2012-11-03 NOTE — Progress Notes (Signed)
[facility]  Indication: 27 yr old [id number] at [redacted]w[redacted]d with history of preeclampsia and PPROM at 18 weeks for follow up fetal ultrasound. This fetus with lagging growth.  Findings: 1. Single intrauterine pregnancy. 2. Estimated fetal weight is in the 15th%. The abdominal circumference is in the <3rd%. 3. Posterior placenta without evidence of previa. 4. Normal amniotic fluid index. 5. The limited anatomy survey is normal. 6. Normal umbilical artery Doppler studies. 7. Normal transvaginal cervical length.  Recommendations: 1. Lagging fetal growth: - previously counseled - appropriate interval fetal growth - recommend fetal growth in 3 weeks 2. History of PPROM at 18 weeks: - previously counseled - normal transvaginal cervical length - on 17OH progesterone - no further transvaginal cervical lengths indicated unless symptoms develop - recommend close surveillance for the development of signs/symptoms of preterm labor/PPROM 3. History of preeclampsia: - previously counseled - patient had 2 mild range blood pressures in our office today - discussed increased risk of gestational hypertension and preeclampsia given history - recommend patient go to MAU for lab work (AST, ALT, CBC, BUN, creatinine, uric acid) and serial blood pressures - recommend 24 hour urine (may be done as an outpatient if blood pressures in normal to mild range, labs normal, and patient asymptomatic) - if patient meets criteria for preeclampsia or gestational hypertesion recommend a course of betamethasone and start antenatal testing- either twice weekly nonstress tests and weekly AFI or weekly biophysical profiles; would also consider weekly Doppler studies if meets criteria - recommend close surveillance of blood pressures (at least weekly) and for signs/symptoms of preeclampsia  The above was discussed with Dr. Christell Constant and the patient was sent to MAU  Eulis Foster, MD

## 2012-11-04 ENCOUNTER — Other Ambulatory Visit: Payer: Self-pay | Admitting: Obstetrics and Gynecology

## 2012-11-04 LAB — URINE CULTURE: Colony Count: 80000

## 2012-11-12 NOTE — MAU Provider Note (Signed)
Aware and agree with above plane

## 2012-11-24 ENCOUNTER — Ambulatory Visit (HOSPITAL_COMMUNITY): Payer: Medicaid Other

## 2012-11-28 ENCOUNTER — Inpatient Hospital Stay (HOSPITAL_COMMUNITY)
Admission: AD | Admit: 2012-11-28 | Discharge: 2012-11-28 | Disposition: A | Discharge status: Home / Self Care | Payer: Medicaid Other | Source: Ambulatory Visit | Attending: Obstetrics & Gynecology | Admitting: Obstetrics & Gynecology

## 2012-11-28 ENCOUNTER — Other Ambulatory Visit (HOSPITAL_COMMUNITY): Payer: Self-pay | Admitting: Obstetrics and Gynecology

## 2012-11-28 ENCOUNTER — Encounter (HOSPITAL_COMMUNITY): Payer: Self-pay

## 2012-11-28 ENCOUNTER — Ambulatory Visit (HOSPITAL_COMMUNITY)
Admission: RE | Admit: 2012-11-28 | Discharge: 2012-11-28 | Disposition: A | Discharge status: Home / Self Care | Payer: Medicaid Other | Source: Ambulatory Visit | Attending: Obstetrics and Gynecology | Admitting: Obstetrics and Gynecology

## 2012-11-28 VITALS — BP 145/95 | HR 86 | Wt 226.0 lb

## 2012-11-28 DIAGNOSIS — O3440 Maternal care for other abnormalities of cervix, unspecified trimester: Secondary | ICD-10-CM | POA: Insufficient documentation

## 2012-11-28 DIAGNOSIS — E876 Hypokalemia: Secondary | ICD-10-CM

## 2012-11-28 DIAGNOSIS — O139 Gestational [pregnancy-induced] hypertension without significant proteinuria, unspecified trimester: Secondary | ICD-10-CM

## 2012-11-28 DIAGNOSIS — Z8751 Personal history of pre-term labor: Secondary | ICD-10-CM | POA: Insufficient documentation

## 2012-11-28 DIAGNOSIS — O2620 Pregnancy care for patient with recurrent pregnancy loss, unspecified trimester: Secondary | ICD-10-CM | POA: Insufficient documentation

## 2012-11-28 DIAGNOSIS — IMO0002 Reserved for concepts with insufficient information to code with codable children: Secondary | ICD-10-CM

## 2012-11-28 DIAGNOSIS — O163 Unspecified maternal hypertension, third trimester: Secondary | ICD-10-CM

## 2012-11-28 DIAGNOSIS — O99891 Other specified diseases and conditions complicating pregnancy: Secondary | ICD-10-CM | POA: Insufficient documentation

## 2012-11-28 DIAGNOSIS — O09299 Supervision of pregnancy with other poor reproductive or obstetric history, unspecified trimester: Secondary | ICD-10-CM | POA: Insufficient documentation

## 2012-11-28 DIAGNOSIS — R030 Elevated blood-pressure reading, without diagnosis of hypertension: Secondary | ICD-10-CM | POA: Insufficient documentation

## 2012-11-28 LAB — COMPREHENSIVE METABOLIC PANEL
ALT: 5 U/L (ref 0–35)
Alkaline Phosphatase: 119 U/L — ABNORMAL HIGH (ref 39–117)
CO2: 26 mEq/L (ref 19–32)
Calcium: 10.3 mg/dL (ref 8.4–10.5)
GFR calc Af Amer: >90 mL/min (ref >90)
GFR calc non Af Amer: >90 mL/min (ref >90)
Glucose, Bld: 99 mg/dL (ref 70–99)
Potassium: 2.9 mEq/L — ABNORMAL LOW (ref 3.5–5.1)
Sodium: 135 mEq/L (ref 135–145)
Total Bilirubin: 0.2 mg/dL — ABNORMAL LOW (ref 0.3–1.2)

## 2012-11-28 LAB — URINALYSIS, ROUTINE W REFLEX MICROSCOPIC
Bilirubin Urine: NEGATIVE
Glucose, UA: NEGATIVE mg/dL
Ketones, ur: NEGATIVE mg/dL
Protein, ur: NEGATIVE mg/dL

## 2012-11-28 LAB — URINE MICROSCOPIC-ADD ON

## 2012-11-28 LAB — CBC WITH DIFFERENTIAL/PLATELET
Eosinophils Relative: 1 % (ref 0–5)
Lymphocytes Relative: 17 % (ref 12–46)
Lymphs Abs: 1.6 10*3/uL (ref 0.7–4.0)
MCV: 87.2 fL (ref 78.0–100.0)
Platelets: 256 10*3/uL (ref 150–400)
RBC: 3.74 MIL/uL — ABNORMAL LOW (ref 3.87–5.11)
WBC: 9.3 10*3/uL (ref 4.0–10.5)

## 2012-11-28 MED ORDER — POTASSIUM CHLORIDE CRYS ER 10 MEQ PO TBCR
10.0000 meq | EXTENDED_RELEASE_TABLET | Freq: Once | ORAL | Status: AC
  Administered 2012-11-28: 10 meq via ORAL
  Filled 2012-11-28: qty 1

## 2012-11-28 NOTE — Progress Notes (Signed)
Karina [name]  was seen today for an ultrasound appointment.  See full report in [facility].  Comments: Ms. Suman reports HA and visual changes as well as swelling.  BPs in clinic 145/95.  Repeat 140/93- had normal preeclampsia labs earlier this week.  24-hr urine protein was unavailable at the time of evaluation.  Impression: Single IUP at 33 1/7 weeks Follow up due to hx of PPROM at 18 weeks and history of preeclampsia as well as lagging growth The overall EFW today is at the 18th %tile.  The [facility] measures at the 5th %tile Normal amniotic fluid volume  Recommendations: Patient referred to MAU for serial blood pressure checks and possible labs - discussed with Dr. Christell Constant. Recommend follow up ultraosund in 3-4 weeks for interval growth. Recommend initiating 2x weekly NSTs  and weekly clinic visits for BP checks if the patient meets criteria for mild preeclampsia.  Alpha Gula, MD

## 2012-11-28 NOTE — MAU Note (Signed)
Patient was sent from MFM for elevated BP

## 2012-11-28 NOTE — MAU Provider Note (Signed)
History     CSN: [id number]  Arrival date and time: 11/28/12 1615   First Provider Initiated Contact with Patient 11/28/12 1731      Chief Complaint  Patient presents with  . Hypertension   HPI Karina [name] is 27 y.o. [id number] [redacted]w[redacted]d weeks presenting after being seen at [facility] today for elevated blood pressures. She reports one other incident of elevated pressures, evaluated here but resolved resolved in a few hours.  Is not taking any medications at this time.  Has amoxicillin for tooth pain, not taking at this time.  Denies chest pain but has indigestion.  Has shortness of breath when baby is moving.  Is getting a headache now, has swelling in her feet.  Is a patient of Dr. Kathi Der.   Patient is stable at this time.  Blood pressure is 155/94.  Denies abdominal pain.   Past Medical History  Diagnosis Date  . Fibroids   . Trichomonas   . Pregnancy   . Hypertension     Past Surgical History  Procedure Laterality Date  . Dilation and curettage of uterus    . Cervical cerclage      Family History  Problem Relation Age of Onset  . Hypertension Mother   . Hypertension Father     History  Substance Use Topics  . Smoking status: Former Games developer  . Smokeless tobacco: Never Used  . Alcohol Use: No    Allergies: No Known Allergies  Prescriptions prior to admission  Medication Sig Dispense Refill  . acetaminophen (TYLENOL) 325 MG tablet Take 650 mg by mouth daily as needed for pain.      Marland Kitchen amoxicillin (AMOXIL) 500 MG capsule Take 500 mg by mouth daily.      . Prenatal Vit-Fe Fumarate-FA (PRENATAL MULTIVITAMIN) TABS Take 1 tablet by mouth daily at 12 noon.        Review of Systems  Constitutional: Negative for fever and chills.  Eyes: Negative.   Respiratory: Positive for shortness of breath (when fetus is moving).   Cardiovascular: Negative for chest pain.  Gastrointestinal: Positive for heartburn. Negative for nausea, vomiting and abdominal pain.  Genitourinary:   Neg for vaginal bleeding  Neurological: Positive for headaches (mild).   Physical Exam   Blood pressure 153/91, pulse 65, temperature 98 F (36.7 C), temperature source Oral, last menstrual period 04/10/2012.  Marland Kitchen Physical Exam  Constitutional: She appears well-developed and well-nourished. No distress.  HENT:  Head: Normocephalic.  Neck: Normal range of motion.  Cardiovascular: Normal rate.   Respiratory: Effort normal.  GI: Soft. She exhibits no distension and no mass. There is no tenderness. There is no rebound and no guarding.  Genitourinary:  Not indicated  Musculoskeletal: She exhibits no edema.  Neurological: She displays abnormal reflex (1+ without clonus).  Skin: Skin is warm and dry.  Psychiatric: She has a normal mood and affect. Her behavior is normal.    Results for orders placed during the hospital encounter of 11/28/12 (from the past 24 hour(s))  URINALYSIS, ROUTINE W REFLEX MICROSCOPIC     Status: Abnormal   Collection Time    11/28/12  4:25 PM      Result Value Range   Color, Urine YELLOW  YELLOW   APPearance CLEAR  CLEAR   Specific Gravity, Urine 1.010  1.005 - 1.030   pH 7.0  5.0 - 8.0   Glucose, UA NEGATIVE  NEGATIVE mg/dL   Hgb urine dipstick SMALL (*) NEGATIVE   Bilirubin Urine NEGATIVE  NEGATIVE   Ketones, ur NEGATIVE  NEGATIVE mg/dL   Protein, ur NEGATIVE  NEGATIVE mg/dL   Urobilinogen, UA 0.2  0.0 - 1.0 mg/dL   Nitrite NEGATIVE  NEGATIVE   Leukocytes, UA MODERATE (*) NEGATIVE  URINE MICROSCOPIC-ADD ON     Status: Abnormal   Collection Time    11/28/12  4:25 PM      Result Value Range   Squamous Epithelial / LPF FEW (*) RARE   WBC, UA 7-10  <3 WBC/hpf   RBC / HPF 3-6  <3 RBC/hpf   Bacteria, UA MANY (*) RARE   Urine-Other MUCOUS PRESENT    CBC WITH DIFFERENTIAL     Status: Abnormal   Collection Time    11/28/12  5:04 PM      Result Value Range   WBC 9.3  4.0 - 10.5 K/uL   RBC 3.74 (*) 3.87 - 5.11 MIL/uL   Hemoglobin 11.5 (*) 12.0 - 15.0  g/dL   HCT 16.1 (*) 09.6 - 04.5 %   MCV 87.2  78.0 - 100.0 fL   MCH 30.7  26.0 - 34.0 pg   MCHC 35.3  30.0 - 36.0 g/dL   RDW 40.9  81.1 - 91.4 %   Platelets 256  150 - 400 K/uL   Neutrophils Relative 72  43 - 77 %   Neutro Abs 6.7  1.7 - 7.7 K/uL   Lymphocytes Relative 17  12 - 46 %   Lymphs Abs 1.6  0.7 - 4.0 K/uL   Monocytes Relative 10  3 - 12 %   Monocytes Absolute 1.0  0.1 - 1.0 K/uL   Eosinophils Relative 1  0 - 5 %   Eosinophils Absolute 0.1  0.0 - 0.7 K/uL   Basophils Relative 0  0 - 1 %   Basophils Absolute 0.0  0.0 - 0.1 K/uL  COMPREHENSIVE METABOLIC PANEL     Status: Abnormal   Collection Time    11/28/12  5:04 PM      Result Value Range   Sodium 135  135 - 145 mEq/L   Potassium 2.9 (*) 3.5 - 5.1 mEq/L   Chloride 102  96 - 112 mEq/L   CO2 26  19 - 32 mEq/L   Glucose, Bld 99  70 - 99 mg/dL   BUN 5 (*) 6 - 23 mg/dL   Creatinine, Ser 7.82  0.50 - 1.10 mg/dL   Calcium 95.6  8.4 - 21.3 mg/dL   Total Protein 5.5 (*) 6.0 - 8.3 g/dL   Albumin 2.4 (*) 3.5 - 5.2 g/dL   AST 9  0 - 37 U/L   ALT 5  0 - 35 U/L   Alkaline Phosphatase 119 (*) 39 - 117 U/L   Total Bilirubin 0.2 (*) 0.3 - 1.2 mg/dL   GFR calc non Af Amer >90  >90 mL/min   GFR calc Af Amer >90  >90 mL/min  LACTATE DEHYDROGENASE     Status: None   Collection Time    11/28/12  5:04 PM      Result Value Range   LDH 138  94 - 250 U/L  URIC ACID     Status: None   Collection Time    11/28/12  5:04 PM      Result Value Range   Uric Acid, Serum 5.1  2.4 - 7.0 mg/dL   Patient Vitals for the past 24 hrs:  BP Temp Temp src Pulse  11/28/12 1746 153/91 mmHg - - 65  11/28/12 1731 155/94 mmHg - - 65  11/28/12 1716 148/108 mmHg - - 70  11/28/12 1701 143/98 mmHg - - 77  11/28/12 1646 144/89 mmHg - - 74  11/28/12 1633 131/81 mmHg 98 F (36.7 C) Oral 76   MAU Course  Procedures  NST reactive.  Occ contraction  MDM 17:38  Offered tylenol for headache.  Patient declined saying it isn't bad enough.   18:35  Reported  Bps and PIH labs.  Order given for Potassium 10mg  po now, make sure patient has a way to check BPs at home, if not return to office in the am for recheck.   Evaluate the patient for 2 hrs and call Dr. Christell Constant back with Bps.   18:45 discussed plan of care with the patient.  She states her sister is a CNA and she can have her blood pressure checked over the weekend.  She does not want to stay here for 2 more hours.  I called Dr. Christell Constant and reported her desire to leave.  Order given that she may leave with instructions to call if BPs > 140/90.  Assessment and Plan  A:  Elevated blood pressures at [redacted]w[redacted]d      Low K+  P:  Patient instructed to take blood pressures over the weekend and to call Dr. Christell Constant if they are > 140/90        She will keep her scheduled appointment.       Pre-eclampsia cautions given--return for headache, visual changes, chest pain or increased swelling, decreased fetal movement   KEY,EVE M 11/28/2012, 6:46 PM   Discussed above and the pt had  Been compliant dm

## 2012-11-28 NOTE — MAU Note (Signed)
Started having headaches off and on since last week, takes Tylenol and it goes away, some blurry vision.

## 2012-11-30 ENCOUNTER — Encounter (HOSPITAL_COMMUNITY): Payer: Self-pay | Admitting: *Deleted

## 2012-11-30 ENCOUNTER — Inpatient Hospital Stay (HOSPITAL_COMMUNITY)
Admission: AD | Admit: 2012-11-30 | Discharge: 2012-12-27 | DRG: 775 | Disposition: A | Discharge status: Home / Self Care | Payer: Medicaid Other | Source: Ambulatory Visit | Attending: Obstetrics and Gynecology | Admitting: Obstetrics and Gynecology

## 2012-11-30 DIAGNOSIS — K089 Disorder of teeth and supporting structures, unspecified: Secondary | ICD-10-CM | POA: Diagnosis present

## 2012-11-30 DIAGNOSIS — O133 Gestational [pregnancy-induced] hypertension without significant proteinuria, third trimester: Secondary | ICD-10-CM

## 2012-11-30 DIAGNOSIS — O3430 Maternal care for cervical incompetence, unspecified trimester: Secondary | ICD-10-CM | POA: Diagnosis present

## 2012-11-30 DIAGNOSIS — O09299 Supervision of pregnancy with other poor reproductive or obstetric history, unspecified trimester: Secondary | ICD-10-CM

## 2012-11-30 DIAGNOSIS — O3440 Maternal care for other abnormalities of cervix, unspecified trimester: Secondary | ICD-10-CM

## 2012-11-30 DIAGNOSIS — O365931 Maternal care for other known or suspected poor fetal growth, third trimester, fetus 1: Secondary | ICD-10-CM

## 2012-11-30 DIAGNOSIS — O99892 Other specified diseases and conditions complicating childbirth: Secondary | ICD-10-CM | POA: Diagnosis present

## 2012-11-30 DIAGNOSIS — O34599 Maternal care for other abnormalities of gravid uterus, unspecified trimester: Secondary | ICD-10-CM | POA: Diagnosis present

## 2012-11-30 DIAGNOSIS — D4959 Neoplasm of unspecified behavior of other genitourinary organ: Secondary | ICD-10-CM | POA: Diagnosis present

## 2012-11-30 DIAGNOSIS — D259 Leiomyoma of uterus, unspecified: Secondary | ICD-10-CM | POA: Diagnosis present

## 2012-11-30 DIAGNOSIS — Z8751 Personal history of pre-term labor: Secondary | ICD-10-CM

## 2012-11-30 DIAGNOSIS — O365990 Maternal care for other known or suspected poor fetal growth, unspecified trimester, not applicable or unspecified: Secondary | ICD-10-CM | POA: Diagnosis present

## 2012-11-30 DIAGNOSIS — O2620 Pregnancy care for patient with recurrent pregnancy loss, unspecified trimester: Secondary | ICD-10-CM

## 2012-11-30 DIAGNOSIS — IMO0002 Reserved for concepts with insufficient information to code with codable children: Secondary | ICD-10-CM

## 2012-11-30 DIAGNOSIS — O139 Gestational [pregnancy-induced] hypertension without significant proteinuria, unspecified trimester: Principal | ICD-10-CM | POA: Diagnosis present

## 2012-11-30 HISTORY — DX: Gestational (pregnancy-induced) hypertension without significant proteinuria, unspecified trimester: O13.9

## 2012-11-30 HISTORY — DX: Depression, unspecified: F32.A

## 2012-11-30 HISTORY — DX: Personal history of other infectious and parasitic diseases: Z86.19

## 2012-11-30 HISTORY — DX: Major depressive disorder, single episode, unspecified: F32.9

## 2012-11-30 LAB — TYPE AND SCREEN: ABO/RH(D): AB POS

## 2012-11-30 LAB — COMPREHENSIVE METABOLIC PANEL
Albumin: 2.4 g/dL — ABNORMAL LOW (ref 3.5–5.2)
BUN: 4 mg/dL — ABNORMAL LOW (ref 6–23)
Chloride: 104 mEq/L (ref 96–112)
Creatinine, Ser: 0.63 mg/dL (ref 0.50–1.10)
GFR calc Af Amer: >90 mL/min (ref >90)
GFR calc non Af Amer: >90 mL/min (ref >90)
Total Bilirubin: 0.2 mg/dL — ABNORMAL LOW (ref 0.3–1.2)

## 2012-11-30 LAB — URINALYSIS, ROUTINE W REFLEX MICROSCOPIC
Bilirubin Urine: NEGATIVE
Glucose, UA: NEGATIVE mg/dL
Ketones, ur: NEGATIVE mg/dL
Specific Gravity, Urine: <1.005 — ABNORMAL LOW (ref 1.005–1.030)
pH: 6.5 (ref 5.0–8.0)

## 2012-11-30 LAB — CBC
HCT: 33.1 % — ABNORMAL LOW (ref 36.0–46.0)
MCHC: 35.3 g/dL (ref 30.0–36.0)
MCV: 86.6 fL (ref 78.0–100.0)
RDW: 13.7 % (ref 11.5–15.5)
WBC: 7.9 10*3/uL (ref 4.0–10.5)

## 2012-11-30 LAB — URINE MICROSCOPIC-ADD ON

## 2012-11-30 LAB — PROTEIN / CREATININE RATIO, URINE
Creatinine, Urine: 93.17 mg/dL
Total Protein, Urine: 9.8 mg/dL

## 2012-11-30 LAB — ABO/RH: ABO/RH(D): AB POS

## 2012-11-30 LAB — LACTATE DEHYDROGENASE: LDH: 140 U/L (ref 94–250)

## 2012-11-30 MED ORDER — LACTATED RINGERS IV SOLN
INTRAVENOUS | Status: DC
  Administered 2012-11-30 – 2012-12-02 (×6): via INTRAVENOUS

## 2012-11-30 MED ORDER — PRENATAL MULTIVITAMIN CH: 1.0000 | ORAL_TABLET | Freq: Every day | ORAL | Status: DC

## 2012-11-30 MED ORDER — DOCUSATE SODIUM 100 MG PO CAPS
100.0000 mg | ORAL_CAPSULE | Freq: Every day | ORAL | Status: DC
  Administered 2012-12-02 – 2012-12-24 (×15): 100 mg via ORAL
  Filled 2012-11-30 (×22): qty 1

## 2012-11-30 MED ORDER — MAGNESIUM SULFATE BOLUS VIA INFUSION
4.0000 g | Freq: Once | INTRAVENOUS | Status: AC
  Administered 2012-11-30: 4 g via INTRAVENOUS
  Filled 2012-11-30: qty 500

## 2012-11-30 MED ORDER — ZOLPIDEM TARTRATE 5 MG PO TABS
5.0000 mg | ORAL_TABLET | Freq: Every evening | ORAL | Status: DC | PRN
  Administered 2012-11-30 – 2012-12-02 (×2): 5 mg via ORAL
  Filled 2012-11-30 (×2): qty 1

## 2012-11-30 MED ORDER — BETAMETHASONE SOD PHOS & ACET 6 (3-3) MG/ML IJ SUSP
12.0000 mg | INTRAMUSCULAR | Status: AC
  Administered 2012-11-30 – 2012-12-01 (×2): 12 mg via INTRAMUSCULAR
  Filled 2012-11-30 (×2): qty 2

## 2012-11-30 MED ORDER — ACETAMINOPHEN 325 MG PO TABS
650.0000 mg | ORAL_TABLET | ORAL | Status: DC | PRN
  Administered 2012-12-01 – 2012-12-12 (×3): 650 mg via ORAL
  Filled 2012-11-30 (×4): qty 2

## 2012-11-30 MED ORDER — MAGNESIUM SULFATE 40 G IN LACTATED RINGERS - SIMPLE: 2.0000 g/h | Freq: Once | INTRAVENOUS | Status: DC

## 2012-11-30 MED ORDER — CALCIUM CARBONATE ANTACID 500 MG PO CHEW
2.0000 | CHEWABLE_TABLET | ORAL | Status: DC | PRN
  Administered 2012-12-11: 400 mg via ORAL
  Filled 2012-11-30: qty 2

## 2012-11-30 MED ORDER — ACETAMINOPHEN 500 MG PO TABS
1000.0000 mg | ORAL_TABLET | Freq: Once | ORAL | Status: AC
  Administered 2012-11-30: 1000 mg via ORAL
  Filled 2012-11-30: qty 2

## 2012-11-30 MED ORDER — LABETALOL HCL 5 MG/ML IV SOLN: 10.0000 mg | INTRAVENOUS | Status: DC | PRN

## 2012-11-30 MED ORDER — MAGNESIUM SULFATE 40 G IN LACTATED RINGERS - SIMPLE
2.0000 g/h | INTRAVENOUS | Status: DC
  Administered 2012-12-01: 2 g/h via INTRAVENOUS
  Filled 2012-11-30 (×2): qty 500

## 2012-11-30 NOTE — H&P (Addendum)
Karina [name] is a 27 y.o. female presenting for evaluation of HA and elevated BPs. Maternal Medical History:  Reason for admission: Nausea. Being placed on observation secondary to elevated BPs and consistent with PIH with several 150s/90s greater than 4 hours apart.  She now has a HA that did not go away with tylenol. This had been successful and on observation in MAU the tylenol 1000mg  did not alleviate the HA.  Of note, is that she has a one year old and has not been able to get rest.  She was also noncompliant with BP monitoring at home and symptom monitoring at home with BPs in the 150 /90 range yesterday and also had a HA at home.  She was to go to the hospital but did not do this and called this afternoon and was directed to be prepared to be monitored in the hospital.  Magnesium and Celestone were initiated because of the new onset of HA not easily resolved and increased BPs.  Labs have been normal and once again are normal today.  Fetal activity: Perceived fetal activity is normal.   Last perceived fetal movement was within the past hour.    Prenatal complications: PIH and IUGR.   No bleeding, cholelithiasis, HIV, infection, nephrolithiasis, oligohydramnios, placental abnormality, polyhydramnios, preterm labor, substance abuse, thrombocytopenia or thrombophilia.   Prenatal Complications - Diabetes: none.    OB History   Grav Para Term Preterm Abortions TAB SAB Ect Mult Living   5 1 0 1 3 0 3 0 0 1     Recurrent pregnancy loss - (SAB X 3)  Last pregnancy had cerclage placed at 18 week and removed for induction at 34 weeks secondary to [facility] severe  Past Medical History  Diagnosis Date  . Fibroids   . Trichomonas   . Pregnancy   . Hypertension    Past Surgical History  Procedure Laterality Date  . Dilation and curettage of uterus    . Cervical cerclage     Family History: family history includes Hypertension in her father and mother. Social History:  reports that  she has quit smoking. She has never used smokeless tobacco. She reports that she does not drink alcohol or use illicit drugs.   Prenatal Transfer Tool  Maternal Diabetes: No Genetic Screening: Normal negative QUAD screen Maternal Ultrasounds/Referrals: Normal except for 15% EFW but normal otherwise Fetal Ultrasounds or other Referrals:  Referred to Materal Fetal Medicine  Maternal Substance Abuse:  Yes:  Type: Smoker, Other: but stopped when pregnancy discovered Significant Maternal Medications:  None Significant Maternal Lab Results:  None Other Comments:  None  Review of Systems  Constitutional: Negative.   HENT: Negative for hearing loss, ear pain, nosebleeds, congestion, sore throat, neck pain, tinnitus and ear discharge.   Eyes: Negative.  Negative for blurred vision, double vision, photophobia and discharge.  Respiratory: Negative.  Negative for cough, hemoptysis, shortness of breath and stridor.   Cardiovascular: Negative.  Negative for chest pain, palpitations, orthopnea and leg swelling.  Gastrointestinal: Negative.  Negative for heartburn, nausea, vomiting, abdominal pain, diarrhea and constipation.  Genitourinary: Negative.  Negative for dysuria, hematuria and flank pain.  Musculoskeletal: Negative.  Negative for myalgias and back pain.  Skin: Negative.  Negative for itching and rash.  Neurological: Positive for headaches. Negative for dizziness, tingling, tremors, focal weakness and seizures.  Endo/Heme/Allergies: Negative.  Does not bruise/bleed easily.  Psychiatric/Behavioral: Negative.  Negative for depression, suicidal ideas and substance abuse.      Blood pressure  140/89, pulse 73, temperature 97.8 F (36.6 C), temperature source Oral, resp. rate 18, height 5\' 5"  (1.651 m), weight 102.876 kg (226 lb 12.8 oz), last menstrual period 04/10/2012. Maternal Exam:  Abdomen: Estimated fetal weight is 5#8.    Introitus: not evaluated.   Ferning test: not done.  Nitrazine  test: not done. Amniotic fluid character: not assessed.  Cervix: not evaluated.   Physical Exam  Constitutional: She appears well-developed and well-nourished.  HENT:  Head: Normocephalic and atraumatic.  Right Ear: External ear normal.  Left Ear: External ear normal.  Mouth/Throat: No oropharyngeal exudate.  Eyes: Conjunctivae and EOM are normal. Pupils are equal, round, and reactive to light. Right eye exhibits no discharge. Left eye exhibits discharge.  Neck: Normal range of motion. Neck supple. No JVD present. No tracheal deviation present. No thyromegaly present.  Cardiovascular: Normal rate, regular rhythm and normal heart sounds.   Respiratory: Effort normal and breath sounds normal. No respiratory distress. She has no wheezes. She has no rales.  GI: Soft. There is no tenderness. There is no rebound and no guarding.  Gravid and nontender  Musculoskeletal: Normal range of motion. She exhibits no edema.  Lymphadenopathy:    She has no cervical adenopathy.  Neurological: She is alert. She has normal reflexes.  DTRs 1+  Skin: Skin is warm and dry. No rash noted.  Psychiatric: She has a normal mood and affect. Her behavior is normal. Judgment and thought content normal.    Prenatal labs: ABO, Rh:  ABpos Antibody:  negative Rubella:  immune RPR:   nonreactive HBsAg:   negative HIV:   nonreactive GBS:   unknown  Assessment/Plan: IUP 32w6 PIH with HA and failed outpatient management Fibroid Second trimester loss - is on 7 OHP - not sure of last dose Recurrent pregnancy loss H/o cerclage in prior pregnancy and chose to monitor with serial cervical length  P  Start celestone and magnesium Monitor BPs Get MFM consult Get Neonatology to see her to review expectations Get latest prenatal record Review AM labs Check on last status of 17 OHP Karina [name] H. 11/30/2012, 6:49 PM

## 2012-11-30 NOTE — MAU Note (Addendum)
Pt reports a headache and swollen, sore feet.  Pt took a Motrin 800 @ 0300 w/ no relief of h/a

## 2012-11-30 NOTE — Consult Note (Signed)
Neonatology Consult to Antenatal Patient:  I was asked by Dr. Christell Constant to see this patient in order to provide antenatal counseling due to significant PIH at 32 6/7 weeks.  Karina [name] is admitted today at 74 6/[redacted] weeks GA. She is currently not having active labor. She is getting BMZ and IV Magnesium sulfate and Labetalol. Her first pregnancy was also complicated by [facility] and she delivered at 34 weeks, 18 months ago, so she is familiar with our NICU.  I spoke with the patient alone. We discussed the worst case of delivery in the next 1-2 days, including usual DR management, possible respiratory complications and need for support, IV access, feedings, LOS, Mortality and Morbidity, and long term outcomes. She did not have any questions at this time. I offered a NICU tour to any interested family members and would be glad to come back if she has more questions later.  Thank you for asking me to see this patient.  Karina Sou, MD Neonatologist  The total length of face-to-face or floor/unit time for this encounter was 25 minutes. Counseling and/or coordination of care was 15 minutes of the above.

## 2012-11-30 NOTE — MAU Note (Signed)
Pt reports her b/p was 162/98. C/ o headache. Took tylenol no releif. Feet are swollen as well. Was evaluated on Friday for elvated b/p. MD told her to come in today .

## 2012-11-30 NOTE — MAU Provider Note (Signed)
History     CSN: [id number]  Arrival date and time: 11/30/12 1230   None     Chief Complaint  Patient presents with  . Hypertension   HPI [name] is a 27 y.o. [id number] at [redacted]w[redacted]d who presents to MAU today with complaint of high blood pressure. The patient states no history of chronic HTN, but had pre-eclampsia with last child. She states increased BP in the office on/off x 1 month. She has headache today that she is rating at 6/10, lower extremity edema and some blurred vision occasionally. She denies abdominal pain, but is having mild lower abdominal pressure. She reports good fetal movement.   OB History   Grav Para Term Preterm Abortions TAB SAB Ect Mult Living   5 1 0 1 3 0 3 0 0 1       Past Medical History  Diagnosis Date  . Fibroids   . Trichomonas   . Pregnancy   . Hypertension     Past Surgical History  Procedure Laterality Date  . Dilation and curettage of uterus    . Cervical cerclage      Family History  Problem Relation Age of Onset  . Hypertension Mother   . Hypertension Father     History  Substance Use Topics  . Smoking status: Former Games developer  . Smokeless tobacco: Never Used  . Alcohol Use: No    Allergies: No Known Allergies  Prescriptions prior to admission  Medication Sig Dispense Refill  . acetaminophen (TYLENOL) 325 MG tablet Take 650 mg by mouth daily as needed for pain.      Marland Kitchen ibuprofen (ADVIL,MOTRIN) 800 MG tablet Take 800 mg by mouth every 8 (eight) hours as needed for pain.      . Prenatal Vit-Fe Fumarate-FA (PRENATAL MULTIVITAMIN) TABS Take 1 tablet by mouth daily at 12 noon.        Review of Systems  Constitutional: Negative for fever and malaise/fatigue.  Gastrointestinal: Positive for nausea. Negative for vomiting, abdominal pain, diarrhea and constipation.  Genitourinary: Negative for dysuria, urgency and frequency.       Neg - vaginal bleeding, LOF  Musculoskeletal:       + peripheral edema   Physical Exam    Blood pressure 130/88, pulse 75, temperature 97.8 F (36.6 C), temperature source Oral, resp. rate 18, height 5\' 5"  (1.651 m), weight 226 lb 12.8 oz (102.876 kg), last menstrual period 04/10/2012.  Physical Exam  Constitutional: She is oriented to person, place, and time. She appears well-developed and well-nourished. No distress.  HENT:  Head: Normocephalic and atraumatic.  Cardiovascular: Normal rate, regular rhythm and normal heart sounds.   Respiratory: Effort normal and breath sounds normal. No respiratory distress.  GI: Soft. Bowel sounds are normal. She exhibits no distension and no mass. There is no tenderness. There is no rebound and no guarding.  Neurological: She is alert and oriented to person, place, and time. She has normal reflexes.  No pitting edema No clonus  Skin: Skin is warm and dry. No erythema.  Psychiatric: She has a normal mood and affect.   Results for orders placed during the hospital encounter of 11/30/12 (from the past 24 hour(s))  URINALYSIS, ROUTINE W REFLEX MICROSCOPIC     Status: Abnormal   Collection Time    11/30/12  1:00 PM      Result Value Range   Color, Urine YELLOW  YELLOW   APPearance CLEAR  CLEAR   Specific Gravity, Urine <1.005 (*)  1.005 - 1.030   pH 6.5  5.0 - 8.0   Glucose, UA NEGATIVE  NEGATIVE mg/dL   Hgb urine dipstick TRACE (*) NEGATIVE   Bilirubin Urine NEGATIVE  NEGATIVE   Ketones, ur NEGATIVE  NEGATIVE mg/dL   Protein, ur NEGATIVE  NEGATIVE mg/dL   Urobilinogen, UA 0.2  0.0 - 1.0 mg/dL   Nitrite NEGATIVE  NEGATIVE   Leukocytes, UA SMALL (*) NEGATIVE  URINE MICROSCOPIC-ADD ON     Status: Abnormal   Collection Time    11/30/12  1:00 PM      Result Value Range   Squamous Epithelial / LPF FEW (*) RARE   WBC, UA 3-6  <3 WBC/hpf   RBC / HPF 0-2  <3 RBC/hpf   Bacteria, UA FEW (*) RARE  PROTEIN / CREATININE RATIO, URINE     Status: None   Collection Time    11/30/12  1:01 PM      Result Value Range   Creatinine, Urine 93.17      Total Protein, Urine 9.8     PROTEIN CREATININE RATIO 0.11  0.00 - 0.15  CBC     Status: Abnormal   Collection Time    11/30/12  1:27 PM      Result Value Range   WBC 7.9  4.0 - 10.5 K/uL   RBC 3.82 (*) 3.87 - 5.11 MIL/uL   Hemoglobin 11.7 (*) 12.0 - 15.0 g/dL   HCT 16.1 (*) 09.6 - 04.5 %   MCV 86.6  78.0 - 100.0 fL   MCH 30.6  26.0 - 34.0 pg   MCHC 35.3  30.0 - 36.0 g/dL   RDW 40.9  81.1 - 91.4 %   Platelets 236  150 - 400 K/uL  COMPREHENSIVE METABOLIC PANEL     Status: Abnormal   Collection Time    11/30/12  1:27 PM      Result Value Range   Sodium 138  135 - 145 mEq/L   Potassium 3.1 (*) 3.5 - 5.1 mEq/L   Chloride 104  96 - 112 mEq/L   CO2 24  19 - 32 mEq/L   Glucose, Bld 87  70 - 99 mg/dL   BUN 4 (*) 6 - 23 mg/dL   Creatinine, Ser 7.82  0.50 - 1.10 mg/dL   Calcium 8.8  8.4 - 95.6 mg/dL   Total Protein 5.6 (*) 6.0 - 8.3 g/dL   Albumin 2.4 (*) 3.5 - 5.2 g/dL   AST 10  0 - 37 U/L   ALT 6  0 - 35 U/L   Alkaline Phosphatase 122 (*) 39 - 117 U/L   Total Bilirubin 0.2 (*) 0.3 - 1.2 mg/dL   GFR calc non Af Amer >90  >90 mL/min   GFR calc Af Amer >90  >90 mL/min  URIC ACID     Status: None   Collection Time    11/30/12  1:27 PM      Result Value Range   Uric Acid, Serum 5.3  2.4 - 7.0 mg/dL  LACTATE DEHYDROGENASE     Status: None   Collection Time    11/30/12  1:27 PM      Result Value Range   LDH 140  94 - 250 U/L   Patient Vitals for the past 24 hrs:  BP Temp Temp src Pulse Resp Height Weight  11/30/12 1622 130/88 mmHg - - 75 - - -  11/30/12 1612 142/91 mmHg - - 73 - - -  11/30/12 2130  131/82 mmHg - - 69 - - -  11/30/12 1552 139/94 mmHg - - 73 - - -  11/30/12 1542 147/92 mmHg - - 67 - - -  11/30/12 1532 154/99 mmHg - - 66 - - -  11/30/12 1525 152/92 mmHg - - 67 - - -  11/30/12 1512 129/86 mmHg - - 71 - - -  11/30/12 1504 136/85 mmHg - - 77 - - -  11/30/12 1432 139/93 mmHg - - 73 - - -  11/30/12 1422 141/90 mmHg - - 74 - - -  11/30/12 1412 136/83 mmHg - - 76  - - -  11/30/12 1402 132/87 mmHg - - 73 - - -  11/30/12 1352 138/92 mmHg - - 78 - - -  11/30/12 1341 132/92 mmHg - - 81 - - -  11/30/12 1303 152/92 mmHg 97.8 F (36.6 C) Oral 78 18 5\' 5"  (1.651 m) 226 lb 12.8 oz (102.876 kg)    MAU Course  Procedures None  MDM Discussed patient with Dr. Christell Constant. He would like 1000 mg tylenol for headache and serial BPs x 3-4 hours. Call back with re-assessment. 1615 - Called Dr. Christell Constant with updated blood pressure and pain scale. He would like to admit patient to Antenatal for observation. He will put in orders Assessment and Plan  A: Gestational hypertension  P: Admit to Antenatal for observation Dr. Christell Constant to place orders in Epic  Freddi Starr, PA-C 11/30/2012, 4:25 PM   Please see other H&P   Supercede above

## 2012-12-01 ENCOUNTER — Ambulatory Visit (HOSPITAL_COMMUNITY): Payer: Medicaid Other

## 2012-12-01 ENCOUNTER — Inpatient Hospital Stay (HOSPITAL_COMMUNITY): Payer: Medicaid Other

## 2012-12-01 LAB — URINE CULTURE: Colony Count: 30000

## 2012-12-01 LAB — CBC
Platelets: 249 10*3/uL (ref 150–400)
RBC: 3.78 MIL/uL — ABNORMAL LOW (ref 3.87–5.11)
WBC: 8.9 10*3/uL (ref 4.0–10.5)

## 2012-12-01 LAB — COMPREHENSIVE METABOLIC PANEL
CO2: 23 mEq/L (ref 19–32)
Calcium: 8 mg/dL — ABNORMAL LOW (ref 8.4–10.5)
Creatinine, Ser: 0.6 mg/dL (ref 0.50–1.10)
GFR calc Af Amer: >90 mL/min (ref >90)
GFR calc non Af Amer: >90 mL/min (ref >90)
Glucose, Bld: 126 mg/dL — ABNORMAL HIGH (ref 70–99)

## 2012-12-01 MED ORDER — ONDANSETRON HCL 4 MG PO TABS: 4.0000 mg | ORAL_TABLET | Freq: Once | ORAL | Status: DC

## 2012-12-01 MED ORDER — ACETAMINOPHEN-CODEINE #3 300-30 MG PO TABS
2.0000 | ORAL_TABLET | Freq: Once | ORAL | Status: AC
  Administered 2012-12-01: 2 via ORAL
  Filled 2012-12-01: qty 2

## 2012-12-01 MED ORDER — COMPLETENATE 29-1 MG PO CHEW
1.0000 | CHEWABLE_TABLET | Freq: Every day | ORAL | Status: DC
  Administered 2012-12-02 – 2012-12-24 (×16): 1 via ORAL
  Filled 2012-12-01 (×25): qty 1

## 2012-12-01 MED ORDER — ONDANSETRON 4 MG PO TBDP
4.0000 mg | ORAL_TABLET | Freq: Once | ORAL | Status: AC
  Administered 2012-12-01: 4 mg via ORAL
  Filled 2012-12-01: qty 1

## 2012-12-01 NOTE — Progress Notes (Signed)
Pt took shower stood up and felt dizzy, legs weak and shaky  Pt became nausated and vomited in sink  Pt states head started hurting really bad and saw black dots on floor while walking back to bed back to bed safely MD notified

## 2012-12-01 NOTE — Consult Note (Signed)
MFM Note  Ms. Brittle is a 27 year old [id number] AA female at 33+4 weeks who was admitted yesterday for elevated BPs and headaches. She is well known to our service [facility]) due to her prior losses. We followed her cervical lengths early in the pregnancy. Since ~ 29 weeks, however, she has been evaluated multiple times for gestational hypertension in pregnancy. The most recent reassessment was this past Friday. She was released from the MAU but returned yesterday complaining of headaches and increased BPs. She denies decreased fetal movement, contractions, upper abdominal pain, change in vision and vaginal bleeding.   Since admission, she has been receiving magnesium sulfate and she received her first injection of BMZ. She reports having a very bad headache ("like a migraine") which improved after taking two Tylenol #3s and sleeping. She is currently collecting a 24 hour urine. Her renal and liver function has been normal without any s/s of HELLP syndrome. [place] on 04/04 showed a fetus with asymmetric growth lag with the EFW at the 19th %tile and the [facility] at the 5th %tile. The UA dopplers were normal. Today's [place] was reassuring for fetal health.  Assessment: 1) IUP at 33+4 weeks 2) Gestational hypertension of pregnancy - at least; headaches worrisome for severe features 3) Mild lagging fetal growth but reassuring status 4) H/O mid trimester loss thought to be from insufficient cervix 5) H/O preeclampsia  Recommendation: 1) Attempt to get 48 hours after first BMZ dose 2) If stable, can dc magnesium and observe closely 3) If severe headaches continue, would consider induction 4) Weekly AFIs and UA dopplers 5) [place] for growth in 3 weeks 6) Deliver by 37 weeks or sooner with any severe features: BP > 160/110; thrombocytopenia; elevated LFTs; renal insufficiency; pulmonary edema or visual disturbances  Thank you for the kind referral. Please do not hesitate to call with questions or concerns.    (Face-to-face consultation with patient: 30 min)

## 2012-12-01 NOTE — Progress Notes (Signed)
Ur chart review completed.  

## 2012-12-02 LAB — URINE CULTURE

## 2012-12-02 LAB — PROTEIN, URINE, 24 HOUR: Urine Total Volume-UPROT: 6070 mL

## 2012-12-02 LAB — URINALYSIS, ROUTINE W REFLEX MICROSCOPIC
Bilirubin Urine: NEGATIVE
Nitrite: NEGATIVE
Specific Gravity, Urine: <1.005 — ABNORMAL LOW (ref 1.005–1.030)
pH: 7 (ref 5.0–8.0)

## 2012-12-02 LAB — URINE MICROSCOPIC-ADD ON: WBC, UA: NONE SEEN WBC/hpf (ref <3)

## 2012-12-02 MED ORDER — BUTORPHANOL TARTRATE 1 MG/ML IJ SOLN
1.0000 mg | INTRAMUSCULAR | Status: DC | PRN
  Administered 2012-12-02 – 2012-12-17 (×3): 1 mg via INTRAVENOUS
  Filled 2012-12-02 (×4): qty 1

## 2012-12-02 MED ORDER — ZOLPIDEM TARTRATE 5 MG PO TABS
5.0000 mg | ORAL_TABLET | Freq: Every evening | ORAL | Status: DC | PRN
  Administered 2012-12-03 – 2012-12-17 (×14): 5 mg via ORAL
  Filled 2012-12-02 (×12): qty 1
  Filled 2012-12-02: qty 2
  Filled 2012-12-02 (×2): qty 1

## 2012-12-02 NOTE — Progress Notes (Addendum)
Patient ID: Karina [name], female   DOB: 1985-12-05, 27 y.o.   MRN: [id number] S:   She states she has a headache that is frontal and behind the eyes and was able to sleep last night.  The magnesium just stopped and hopefully that will resolve the HA. States the pain is [name] and has taken tylenol and tylenol III and that the headache did have some resolution yesterday.   She states the baby is still moving well the baby not as much as yesterday.  She states she had some mild contractions but not too bad.  She denies any vaginal bleeding or loss of  fluid otherwise.    Reassurances were given. She also states she met with the neonatologist and is much more comfortable regarding the baby and felt that this was helpful.     CBC    Component Value Date/Time   WBC 8.9 12/01/2012 0600   RBC 3.78* 12/01/2012 0600   HGB 11.7* 12/01/2012 0600   HCT 32.8* 12/01/2012 0600   PLT 249 12/01/2012 0600   MCV 86.8 12/01/2012 0600   MCH 31.0 12/01/2012 0600   MCHC 35.7 12/01/2012 0600   RDW 13.7 12/01/2012 0600   LYMPHSABS 1.6 11/28/2012 1704   MONOABS 1.0 11/28/2012 1704   EOSABS 0.1 11/28/2012 1704   BASOSABS 0.0 11/28/2012 1704   CMP     Component Value Date/Time   NA 137 12/01/2012 0600   K 3.2* 12/01/2012 0600   CL 105 12/01/2012 0600   CO2 23 12/01/2012 0600   GLUCOSE 126* 12/01/2012 0600   BUN 3* 12/01/2012 0600   CREATININE 0.60 12/01/2012 0600   CALCIUM 8.0* 12/01/2012 0600   PROT 5.7* 12/01/2012 0600   ALBUMIN 2.4* 12/01/2012 0600   AST 10 12/01/2012 0600   ALT 5 12/01/2012 0600   ALKPHOS 131* 12/01/2012 0600   BILITOT 0.2* 12/01/2012 0600   GFRNONAA >90 12/01/2012 0600   GFRAA >90 12/01/2012 0600   Temp:  [97.4 F (36.3 C)-98.3 F (36.8 C)] 97.9 F (36.6 C) (04/08 0435) Pulse Rate:  [75-95] 75 (04/08 0435) Resp:  [16-20] 20 (04/08 0435) BP: (125-149)/(69-93) 125/76 mmHg (04/08 0435) SpO2:  [99 %-100 %] 99 % (04/07 1701) Weight:  [103.012 kg (227 lb 1.6 oz)] 103.012 kg (227 lb 1.6 oz) (04/07 1400)  Exam General:  Gravid nontoxic appearing, does appear to be  distressed secondary to headache .   Chest clear to auscultation bilaterally Heart regular rate and rhythm Abdomen: Gravid nontender no right upper quadrant tenderness Extremities: No edema 1+ DTRs,   Fetal heart rate reportedly reactive  IUP 33w1  01-19-13 edc [facility] failed outpt management  Fibroid  Second trimester loss - is on 17 OHP - not sure of last dose  Recurrent pregnancy loss  H/o cerclage in prior pregnancy and chose to monitor with serial cervical length  P  finish celestone and magnesium  DC'd Monitor BPs  appreciate MFM consult  appreciate Neonatology seeing pt Get latest prenatal record  Check on last status of 17 OHP     Karina Bernardi H. Christell Constant, MD, [facility] [address] [place], [place], [address] Phone : [phone] Fax:: [phone]

## 2012-12-03 LAB — TYPE AND SCREEN: Antibody Screen: NEGATIVE

## 2012-12-03 MED ORDER — FERROUS FUMARATE 325 (106 FE) MG PO TABS
1.0000 | ORAL_TABLET | Freq: Two times a day (BID) | ORAL | Status: DC
  Administered 2012-12-03 – 2012-12-24 (×19): 106 mg via ORAL
  Filled 2012-12-03 (×44): qty 1

## 2012-12-03 MED ORDER — HYDROXYPROGESTERONE CAPROATE 250 MG/ML IM OIL
250.0000 mg | TOPICAL_OIL | INTRAMUSCULAR | Status: DC
  Administered 2012-12-03 – 2012-12-10 (×2): 250 mg via INTRAMUSCULAR
  Filled 2012-12-03 (×3): qty 1

## 2012-12-03 MED ORDER — ZOLPIDEM TARTRATE 5 MG PO TABS
10.0000 mg | ORAL_TABLET | Freq: Once | ORAL | Status: AC
  Administered 2012-12-03: 10 mg via ORAL

## 2012-12-03 NOTE — Progress Notes (Signed)
Hospital Day: 4  S: 33 weeks,5 days, gestational hypertension No complaint of headache today, much better after Mag turned off.  O: Blood pressure 146/88, pulse 73, temperature 97.5 F (36.4 C), temperature source Oral, resp. rate 24, height 5\' 5"  (1.651 m), weight 104.463 kg (230 lb 4.8 oz), last menstrual period 04/10/2012, SpO2 99.00%.   ZOX:WRUEAV normal limits Toco: mild irregular contractions.   WUJ:WJXBJYNW: 1.5 Effacement (%): 50 Cervical Position: Posterior Station: Ballotable Presentation: Vertex Exam by:: Arnell Asal RNC  A/P- 27 y.o. admitted with 1) IUP at 33+4 weeks  2) Gestational hypertension of pregnancy  3) Mild lagging fetal growth but reassuring status  4) H/O mid trimester loss thought to be from insufficient cervix  5) H/O preeclampsia  Plan Continue bedrest Continue antenatal testing per MFM consults 17 P given today  :    Pregnancy Complications: hypertension  Preterm labor management: no treatment necessary Dating:  33w5

## 2012-12-04 NOTE — Progress Notes (Signed)
UR completed 

## 2012-12-05 NOTE — Progress Notes (Signed)
Hospital Day: 6  S: Complains of irregular, Braxton Hicks type contractions and left sided abdominal pain ( probably positional)  O: Blood pressure 155/87, pulse 70, temperature 98.2 F (36.8 C), temperature source Oral, resp. rate 18, height 5\' 5"  (1.651 m), weight 103.012 kg (227 lb 1.6 oz), last menstrual period 04/10/2012, SpO2 99.00%.   ZOX:WRUEAVWU: 130s to 140s bpm, no decelerations, acceptable variability Toco: irregular, mild JWJ:XBJYNWGN: 1.5 Effacement (%): 50 Cervical Position: Posterior Station: Ballotable Presentation: Vertex Exam by:: Arnell Asal RNC  A/P- 27 y.o. admitted with gestational hypertension, headache 34 weeks, received 17 P injection EFW low, around 20 percentile BPP, dopplers wnl. P: NST 2 times a week BPP, UA doppler study and fluid assessment weekly by MFM    Pregnancy Complications: gestational hypertension  Preterm labor management: no treatment necessary Dating:  [redacted]w[redacted]d

## 2012-12-06 LAB — TYPE AND SCREEN: ABO/RH(D): AB POS

## 2012-12-06 NOTE — Progress Notes (Signed)
Called dr Christell Constant informed of pt uc pattern, to recheck cervix and call him back.

## 2012-12-06 NOTE — Progress Notes (Signed)
Dr Christell Constant informed of sve, fhr, uc pattern, to leave on monitor a while longer and give pt ambien when ready for bed.

## 2012-12-06 NOTE — Progress Notes (Addendum)
Patient ID: Karina [name], female   DOB: 04-Mar-1986, 27 y.o.   MRN: [id number] Hospital Day: 7  S: Complains of irregular, Braxton Hicks type contractions and no HA and no other pains   O: Temp:  [97.5 F (36.4 C)-98.3 F (36.8 C)] 98.2 F (36.8 C) (04/12 0219) Pulse Rate:  [76-88] 82 (04/12 0219) Resp:  [18-24] 18 (04/12 0800) BP: (128-147)/(82-88) 137/83 mmHg (04/12 0219)  NWG:NFAOZHYQ: 130s to 140s bpm, no decelerations, acceptable variability Toco: irregular, mild MVH:QIONGEXB: 1.5 Effacement (%): 50 Cervical Position: Posterior Station: Ballotable Presentation: Vertex Exam by:: Arnell Asal RNC   Stressed importance of the SCDs while in bed Well appearing  No RUQ tenderness   DTRs1-2+ No clonus and no edema  A/P- 27 y.o. admitted with gestational hypertension, headache 34 w2, received 17 P injection EFW low, around 20 percentile BPP, dopplers wnl. P: NST 2 times a week BPP, UA doppler study and fluid assessment weekly by MFM    Pregnancy Complications: gestational hypertension  Preterm labor management: no treatment necessary Dating:  [redacted]w[redacted]d

## 2012-12-07 NOTE — Progress Notes (Signed)
Patient ID: NIREL BABLER, female   DOB: 12-Oct-1985, 27 y.o.   MRN: [id number] Hospital Day: 8  S: Complains of irregular, Braxton Hicks type contractions and only a slight HA last evening that with both she just went back to sleep and did not require further treatment   O: Temp:  [98.1 F (36.7 C)-98.6 F (37 C)] 98.4 F (36.9 C) (04/13 2020) Pulse Rate:  [72-89] 72 (04/13 2023) Resp:  [2-20] 18 (04/13 2023) BP: (132-145)/(69-101) 145/99 mmHg (04/13 2023) Weight:  [102.331 kg (225 lb 9.6 oz)] 102.331 kg (225 lb 9.6 oz) (04/13 1032)    NWG:NFAOZHYQ: 130s to 140s bpm, no decelerations, acceptable variability Toco: irregular, mild MVH:QIONGEXB: 1.5 Effacement (%): 50 Cervical Position: Posterior Station: Ballotable Presentation: Vertex Exam by:: Arnell Asal RNC   Stressed importance of the SCDs while in bed Well appearing   No RUQ tenderness   DTRs1-2+ No clonus and no edema  A/P- 27 y.o. admitted with gestational hypertension that has failed outpt management secondary to noncompliance and the lack of help in caring for [age] year old child  87 w3, received 17 P injection EFW around 20 percentile BPP, dopplers wnl. P: NST 2 times a week BPP, UA doppler study and fluid assessment weekly by MFM    Pregnancy Complications: gestational hypertension  Preterm labor management: no treatment necessary Dating:  [redacted]w[redacted]d

## 2012-12-08 ENCOUNTER — Inpatient Hospital Stay (HOSPITAL_COMMUNITY): Payer: Medicaid Other

## 2012-12-08 LAB — COMPREHENSIVE METABOLIC PANEL
AST: 9 U/L (ref 0–37)
CO2: 22 mEq/L (ref 19–32)
Calcium: 8.7 mg/dL (ref 8.4–10.5)
Creatinine, Ser: 0.62 mg/dL (ref 0.50–1.10)
GFR calc non Af Amer: >90 mL/min (ref >90)

## 2012-12-08 LAB — CBC
MCH: 30.8 pg (ref 26.0–34.0)
MCHC: 35.1 g/dL (ref 30.0–36.0)
MCV: 87.8 fL (ref 78.0–100.0)
Platelets: 263 10*3/uL (ref 150–400)
RBC: 3.7 MIL/uL — ABNORMAL LOW (ref 3.87–5.11)
RDW: 13.9 % (ref 11.5–15.5)

## 2012-12-08 LAB — URINE MICROSCOPIC-ADD ON

## 2012-12-08 LAB — URINALYSIS, ROUTINE W REFLEX MICROSCOPIC
Hgb urine dipstick: NEGATIVE
Nitrite: NEGATIVE
Specific Gravity, Urine: 1.01 (ref 1.005–1.030)
Urobilinogen, UA: 0.2 mg/dL (ref 0.0–1.0)

## 2012-12-08 NOTE — Progress Notes (Signed)
[facility] ultrasound  Indication: 27 yr old [id number] at [redacted]w[redacted]d with mild gestational hypertension, history of preeclampsia, and history of 18 week loss now with fetus with lagging abdominal circumference for AFI and umbilical artery Doppler studies.  Findings: 1. Single intrauterine pregnancy. 2. Posterior placenta without evidence of previa. 3. Normal amniotic fluid index; although slightly decreased for gestational age. 4. Normal umbilical artery Doppler studies.  Recommendations: 1. Gestational hypertension: - had normal 24 hour urine and labs - recommend obtain weekly AST, ALT, CBC, BUN, creatinine - recommend close monitoring of blood pressures - recommend delivery at 37 weeks or sooner for severe blood pressures or evidence of severe gestational hypertension or severe preeclampsia - do not feel need to repeat 24 hour urine as will not change management 2. Previous 18 week loss: - on 17OH progesterone until 36 weeks - preterm labor precautions 3. Lagging fetal growth: - reassuring Doppler studies - as long as patient is inpatient recommend at least daily NST - continue weekly AFI and umbilical artery Doppler studies- normal today - fetal growth next week  Eulis Foster, MD

## 2012-12-08 NOTE — Progress Notes (Signed)
To mfm for ultrasound

## 2012-12-09 ENCOUNTER — Inpatient Hospital Stay (HOSPITAL_COMMUNITY): Payer: Medicaid Other

## 2012-12-09 LAB — URINE CULTURE

## 2012-12-09 NOTE — Progress Notes (Signed)
UR completed 

## 2012-12-09 NOTE — Progress Notes (Signed)
Hospital Day: 10  S: Preterm labor symptoms: Mild irregular contractions still, more today than yesterday.  O: Blood pressure 138/100, pulse 90, temperature 97.6 F (36.4 C), temperature source Oral, resp. rate 18, height 5\' 5"  (1.651 m), weight 102.059 kg (225 lb), last menstrual period 04/10/2012, SpO2 99.00%.   FHT:wnl Toco: mild contractions ONG:EXBMWUXL: 1.5 Effacement (%): 50 Cervical Position: Posterior Station: Ballotable Presentation: Vertex Exam by:: c soliz rn  A/P- 27 y.o. admitted with: gestational HTN, 34 5/7 weeks Mild contractions P: Continue hospital bedrest.  Will monitor.  If spontaneous labor onset, will allow to labor due to persistent HTN at almost 35 weeks.  Present on Admission:  **None**  Pregnancy Complications: hypertension  Preterm labor management: no treatment necessary Dating:  [redacted]w[redacted]d

## 2012-12-09 NOTE — Progress Notes (Signed)
Hospital Day: 10  S: Mild irregular Braxton Hicks like contractions  O: Blood pressure 150/93, pulse 78, temperature 98.7 F (37.1 C), temperature source Oral, resp. rate 18, height 5\' 5"  (1.651 m), weight 102.059 kg (225 lb), last menstrual period 04/10/2012, SpO2 99.00%.   FHT:wnl Toco: irregular ZOX:WRUEAVWU: 1.5 Effacement (%): 50 Cervical Position: Posterior Station: Ballotable Presentation: Vertex Exam by:: c soliz rn  A/P- 27 y.o. admitted with Gestational hypertension.   Stable on bed rest.  Labs reviewed wnl. See MFM [place] consul note low normal amnionic fluid, nl doppler.  P: EFW next week    Pregnancy Complications: hypertension  Preterm labor management Bed rest

## 2012-12-09 NOTE — Progress Notes (Signed)
Antenatal Nutrition Assessment:  Currently  34 5/[redacted] weeks gestation, with mild contractions, gestational HTN. Height  64.75 " Weight 225 Lbs pre-pregnancy weight 223 Lbs.Pre-pregnancy  BMI 37.4  IBW 123 Lbs  Total weight gain 2 Lbs. Weight gain goals 11-20 Lbs.   Estimated needs: 2000-2200 kcal/day, 75-90 grams protein/day, 2.3 liters fluid/day  Regular diet tolerated well, appetite good.Changed diet order to antenatal regular to allow snacks TID Current diet prescription will provide for increased needs.  No abnormal nutrition related labs Hemoglobin & Hematocrit     Component Value Date/Time   HGB 11.4* 12/08/2012 0610   HCT 32.5* 12/08/2012 0610    Nutrition Dx: Increased nutrient needs r/t pregnancy and fetal growth requirements aeb [redacted] weeks gestation.  No educational needs assessed at this time.  Elisabeth Cara M.Odis Luster LDN Neonatal Nutrition Support Specialist Pager [phone]

## 2012-12-10 LAB — TYPE AND SCREEN
ABO/RH(D): AB POS
Antibody Screen: NEGATIVE

## 2012-12-10 NOTE — Plan of Care (Signed)
Problem: Consults Goal: Birthing Suites Patient Information Press F2 to bring up selections list   Pt < [redacted] weeks EGA     

## 2012-12-10 NOTE — Progress Notes (Signed)
Hospital Day: 19  S: Preterm labor symptoms: none  O: Blood pressure 144/98, pulse 76, temperature 97.9 F (36.6 C), temperature source Oral, resp. rate 18, height 5\' 5"  (1.651 m), weight 102.15 kg (225 lb 3.2 oz), last menstrual period 04/10/2012, SpO2 99.00%.   FHT:wnl, no decelerations Toco: irregular, mold ZOX:WRUEAVWU: 1.5 Effacement (%): 50 Cervical Position: Posterior Station: Ballotable Presentation: Vertex Exam by:: c soliz rn  A/P- 27 y.o. admitted with gestational hypertension and headache. Now 34 6/7 weeks.  Stable on bed rest. P: Continue bed rest    Pregnancy Complications: HTN  Preterm labor management:Bed rest  Dating:  [redacted]w[redacted]d   ROD: {Desc; Vaginal delivery planned.

## 2012-12-11 NOTE — Progress Notes (Signed)
Dr Christell Constant notified of the pt c/o chest pain. Observe. Let Dr Chilton Si be aware if not any better after [facility]

## 2012-12-11 NOTE — Progress Notes (Addendum)
Patient ID: EMMANUELA GHAZI, female   DOB: 1986/08/24, 27 y.o.   MRN: [id number] Hospital Day:   S: Complains of irregular, Braxton Hicks type contractions and had an episode of chest pain that was initially treated with TUMS and this resolved ...   O: Temp:  [97.8 F (36.6 C)-98.3 F (36.8 C)] 98.2 F (36.8 C) (04/17 2002) Pulse Rate:  [72-90] 88 (04/17 1559) Resp:  [18] 18 (04/17 2002) BP: (130-149)/(84-98) 145/92 mmHg (04/17 1559) SpO2:  [99 %] 99 % (04/17 1202) YQM:VHQIONGE: 130s to 140s bpm, no decelerations, acceptable variability Toco: irregular, mild XBM:WUXLKGMW:1 Effacement (%): 50 Cervical Position: Posterior Station: Ballotable Presentation: Vertex Exam by:: Arnell Asal RNC   Stressed importance of the SCDs while in bed Well appearing   No RUQ tenderness   DTRs1-2+ No clonus and trace edema  A/P- 27 y.o. admitted with gestational hypertension that has failed outpt management secondary to noncompliance and the lack of help in caring for [age] year old child  35w0, received 17 P injection yesterday   P: NST 2 times a week BPP, UA doppler study and fluid assessment weekly by MFM    Pregnancy Complications: gestational hypertension  Preterm labor management: no treatment necessary Dating:  35w0

## 2012-12-11 NOTE — Progress Notes (Signed)
Monitors applied at the pt request. She states she does not feel the baby moving.Will monitor.

## 2012-12-11 NOTE — Progress Notes (Signed)
Pt describes pain as an irritant and uncomfortable and feels like her HR is "pounding".

## 2012-12-11 NOTE — Plan of Care (Signed)
Problem: Consults Goal: Birthing Suites Patient Information Press F2 to bring up selections list   Pt < [redacted] weeks EGA     

## 2012-12-12 LAB — TYPE AND SCREEN: ABO/RH(D): AB POS

## 2012-12-12 NOTE — Progress Notes (Signed)
Pt refusing Iron supplement over several days.  Rationale regarding purpose of iron binding with O2 and carrying it to where its needed and effects of labor, given to pt and pt still refusing stating that she does not like the way it makes her stomach feel.

## 2012-12-12 NOTE — Progress Notes (Signed)
UR completed 

## 2012-12-12 NOTE — Progress Notes (Signed)
Karina [name] reported that she was doing well and that she did not have any particular need for additional support at this time.  She has good support from family and from her pastor.  She is aware of on-going availability of chaplain support.  [employer] Pager, [phone] 2:08 PM

## 2012-12-13 MED ORDER — ACETAMINOPHEN-CODEINE #3 300-30 MG PO TABS
1.0000 | ORAL_TABLET | ORAL | Status: DC | PRN
  Administered 2012-12-13 – 2012-12-22 (×13): 1 via ORAL
  Filled 2012-12-13: qty 2
  Filled 2012-12-13 (×2): qty 1
  Filled 2012-12-13: qty 2
  Filled 2012-12-13: qty 1
  Filled 2012-12-13: qty 2
  Filled 2012-12-13 (×5): qty 1
  Filled 2012-12-13: qty 2
  Filled 2012-12-13: qty 1

## 2012-12-13 NOTE — Progress Notes (Signed)
Patient ID: Karina [name], female   DOB: 1985-12-15, 27 y.o.   MRN: [id number] Hospital Day:   S: Complains of toothache and states that before she went into the hospital that she was told by a dentist that she would need several root canals and placed on a course of tylenol #3 and amoxicillin.  The pain has not been a problem until yesterday and she has received tylenol #3 today.  She is aware that a dental consult may not happen until MON.  She also complains of irregular contractions and denies anymore chest pain.  She states that her HA is related to the toothache as it is on the left side only.  Denies change in vision or decreased fetal movement  O: Temp:  [97.8 F (36.6 C)-98.2 F (36.8 C)] 98.2 F (36.8 C) (04/19 1540) Pulse Rate:  [80-88] 82 (04/19 1714) Resp:  [18-20] 20 (04/19 1714) BP: (130-149)/(73-106) 136/94 mmHg (04/19 1714)  YQM:VHQIONGE: NST reactive only once per day Toco: irregular, mild XBM:WUXLKGMW:1 Effacement (%): 50 Cervical Position: Posterior Station: Ballotable Presentation: Vertex Exam by:: Arnell Asal RNC   No discrete area of induration in the mouth or gum but is tender over a certain lower left molar tooth no adenopathy  Stressed importance of the SCDs while in bed as this is the 3rd of 3 times that I have rounded on her and the pt takes the SCDs off. Well appearing   No RUQ tenderness   DTRs1-2+ No clonus and trace edema  A/P- 27 y.o. admitted with gestational hypertension that has failed outpt management secondary to noncompliance and the lack of help in caring for [age] year old child  35w2, received 17 P injection 3 days ago  Also now with toothache   P: NST daily BPP, UA doppler study and fluid assessment weekly by MFM Deliver at 37 week Dental consult on MON Consider adding amoxicillin Continue with tylenol #3  Pregnancy Complications: gestational hypertension  Preterm labor management: no treatment necessary Dating:  35w0

## 2012-12-13 NOTE — Progress Notes (Signed)
Hospital Day: 40  S: Preterm labor symptoms: none  O: Blood pressure 132/92, pulse 80, temperature 97.9 F (36.6 C), temperature source Oral, resp. rate 18, height 5\' 5"  (1.651 m), weight 102.15 kg (225 lb 3.2 oz), last menstrual period 04/10/2012, SpO2 99.00%.   FHT:FHR 130s to 140s. No declerations. Toco: Mild irregular Braxton Hicks contractions WUJ:WJXBJYNW: 1.5 Effacement (%): 50 Cervical Position: Posterior Station: Ballotable Presentation: Vertex Exam by:: c soliz rn  A/P- 27 y.o. admitted with:gestational hypertension, headache, lagging fetal growth and decreased fluid P: Continue bedrest EFW to assess growth next week    Pregnancy Complications: hypertension  Preterm labor management: none Dating:  22w1day  ROD: Planned vaginal delivery

## 2012-12-14 NOTE — Progress Notes (Addendum)
Patient ID: Karina [name], female   DOB: 1986-04-01, 26 y.o.   MRN: [id number] Hospital Day: 58  S: Complains of toothache and states that before she went into the hospital that she was told by a dentist that she would need several root canals and placed on a course of tylenol #3 and amoxicillin.  The pain has not been a problem until the last two days and she has received tylenol #3 today and this is controlling the pain.  She is aware that a dental consult may not happen until MON.  She also complains of irregular contractions and denies anymore chest pain.  She states that her HA is related to the toothache and is gone,but does have some nausea with the T-#3.  Denies change in vision or decreased fetal movement  O: Temp:  [97.8 F (36.6 C)-98.5 F (36.9 C)] 98.1 F (36.7 C) (04/20 0804) Pulse Rate:  [71-88] 71 (04/20 0804) Resp:  [18-20] 18 (04/20 0021) BP: (124-149)/(78-106) 130/84 mmHg (04/20 0804)  WUJ:WJXBJYNW: NST reactive only once per day Toco: irregular, mild GNF:AOZHYQMV:7 Effacement (%): 50 Cervical Position: Posterior Station: Ballotable Presentation: Vertex Exam by:: Arnell Asal RNC   No discrete area of induration in the mouth or gum but is tender over a certain lower left molar tooth no adenopathy  Stressed importance of the SCDs while in bed as this is the 3rd of 3 times that I have rounded on her and the pt takes the SCDs off. Well appearing   No RUQ tenderness   DTRs1-2+ No clonus and no edema  A/P- 27 y.o. admitted with gestational hypertension that has failed outpt management secondary to noncompliance and the lack of help in caring for [age] year old child  35w3, received 17 P injection 3 days ago  Also now with toothache   P: NST daily BPP, UA doppler study and fluid assessment weekly by MFM Deliver at 37 week Dental consult on MON Consider adding amoxicillin Continue with tylenol #3  Pregnancy Complications: gestational hypertension  Preterm  labor management: no treatment necessary Dating:  35w3  CBC    Component Value Date/Time   WBC 9.4 12/08/2012 0610   RBC 3.70* 12/08/2012 0610   HGB 11.4* 12/08/2012 0610   HCT 32.5* 12/08/2012 0610   PLT 263 12/08/2012 0610   MCV 87.8 12/08/2012 0610   MCH 30.8 12/08/2012 0610   MCHC 35.1 12/08/2012 0610   RDW 13.9 12/08/2012 0610   LYMPHSABS 1.6 11/28/2012 1704   MONOABS 1.0 11/28/2012 1704   EOSABS 0.1 11/28/2012 1704   BASOSABS 0.0 11/28/2012 1704    . CMP     Component Value Date/Time   NA 137 12/08/2012 0610   K 3.5 12/08/2012 0610   CL 105 12/08/2012 0610   CO2 22 12/08/2012 0610   GLUCOSE 82 12/08/2012 0610   BUN 6 12/08/2012 0610   CREATININE 0.62 12/08/2012 0610   CALCIUM 8.7 12/08/2012 0610   PROT 5.5* 12/08/2012 0610   ALBUMIN 2.4* 12/08/2012 0610   AST 9 12/08/2012 0610   ALT 8 12/08/2012 0610   ALKPHOS 113 12/08/2012 0610   BILITOT 0.2* 12/08/2012 0610   GFRNONAA >90 12/08/2012 0610   GFRAA >90 12/08/2012 8469

## 2012-12-15 ENCOUNTER — Inpatient Hospital Stay (HOSPITAL_COMMUNITY): Payer: Medicaid Other

## 2012-12-15 LAB — URINALYSIS, ROUTINE W REFLEX MICROSCOPIC
Ketones, ur: NEGATIVE mg/dL
Nitrite: NEGATIVE
Protein, ur: NEGATIVE mg/dL
Urobilinogen, UA: 0.2 mg/dL (ref 0.0–1.0)

## 2012-12-15 LAB — COMPREHENSIVE METABOLIC PANEL
AST: 10 U/L (ref 0–37)
Albumin: 2.5 g/dL — ABNORMAL LOW (ref 3.5–5.2)
Calcium: 9.5 mg/dL (ref 8.4–10.5)
Creatinine, Ser: 0.6 mg/dL (ref 0.50–1.10)
GFR calc non Af Amer: >90 mL/min (ref >90)

## 2012-12-15 LAB — CBC
MCH: 30.5 pg (ref 26.0–34.0)
MCV: 87.5 fL (ref 78.0–100.0)
Platelets: 245 10*3/uL (ref 150–400)
RDW: 13.8 % (ref 11.5–15.5)

## 2012-12-15 LAB — TYPE AND SCREEN: ABO/RH(D): AB POS

## 2012-12-15 NOTE — Progress Notes (Signed)
Staci Acosta  was seen today for an ultrasound appointment.  See full report in [facility].  Impression: Single IUP at 35 4/7 weeks Follow up due to lagging growth, gestational hypertension, hx of 18 week loss The estimated fetal weight today is at the 14th %tile.  The AC measures at the 7th %tile. Umbilical artery Doppler studies are normal for gestational age. Normal amniotic fluid volume BPP 6/8 (-2 for lack of breathing activity)  Recommendations: Please perform NST on arrival to antepartum ward.   Would move toward delivery for non-reassuring fetal testing or severe range blood pressures Delivery at 37 weeks or sooner as clinically indicated  Alpha Gula, MD

## 2012-12-15 NOTE — Progress Notes (Signed)
Pt back to her room after MFM

## 2012-12-15 NOTE — Progress Notes (Signed)
Ur chart review completed.  

## 2012-12-15 NOTE — Progress Notes (Signed)
FACULTY PRACTICE ANTEPARTUM(COMPREHENSIVE) NOTE  Karina [name] is a 27 y.o. [id number] at [redacted]w[redacted]d by early ultrasound who is admitted for htn.   Fetal presentation is cephalic. Length of Stay:  15  Days  Subjective: Patient met, transition to [facility]/Faculty practice discussed. Patient reports the fetal movement as active. Patient reports uterine contraction  activity as none. Patient reports  vaginal bleeding as none. Patient describes fluid per vagina as None.  Vitals:  Blood pressure 140/89, pulse 85, temperature 97.9 F (36.6 C), temperature source Oral, resp. rate 24, height 5\' 5"  (1.651 m), weight 225 lb 3.2 oz (102.15 kg), last menstrual period 04/10/2012, SpO2 99.00%. Physical Examination:  General appearance - alert, well appearing, and in no distress Heart - normal rate and regular rhythm Abdomen - soft, nontender, nondistended Fundal Height:  size equals dates Cervical Exam: Not evaluated. and found to be / / and fetal presentation is cephalic Extremities: extremities normal, atraumatic, no cyanosis or edema and Homans sign is negative, no sign of DVT with DTRs 2+ bilaterally Membranes:intact  Fetal Monitoring:  Baseline: 140 bpm  Labs: NST reactive  Bpp 6/8 Results for orders placed during the hospital encounter of 11/30/12 (from the past 24 hour(s))  CBC   Collection Time    12/15/12  6:40 AM      Result Value Range   WBC 8.0  4.0 - 10.5 K/uL   RBC 3.83 (*) 3.87 - 5.11 MIL/uL   Hemoglobin 11.7 (*) 12.0 - 15.0 g/dL   HCT 65.7 (*) 84.6 - 96.2 %   MCV 87.5  78.0 - 100.0 fL   MCH 30.5  26.0 - 34.0 pg   MCHC 34.9  30.0 - 36.0 g/dL   RDW 95.2  84.1 - 32.4 %   Platelets 245  150 - 400 K/uL  COMPREHENSIVE METABOLIC PANEL   Collection Time    12/15/12  6:40 AM      Result Value Range   Sodium 136  135 - 145 mEq/L   Potassium 3.5  3.5 - 5.1 mEq/L   Chloride 103  96 - 112 mEq/L   CO2 24  19 - 32 mEq/L   Glucose, Bld 85  70 - 99 mg/dL   BUN 6  6 - 23 mg/dL    Creatinine, Ser 4.01  0.50 - 1.10 mg/dL   Calcium 9.5  8.4 - 02.7 mg/dL   Total Protein 5.7 (*) 6.0 - 8.3 g/dL   Albumin 2.5 (*) 3.5 - 5.2 g/dL   AST 10  0 - 37 U/L   ALT 7  0 - 35 U/L   Alkaline Phosphatase 127 (*) 39 - 117 U/L   Total Bilirubin 0.2 (*) 0.3 - 1.2 mg/dL   GFR calc non Af Amer >90  >90 mL/min   GFR calc Af Amer >90  >90 mL/min  TYPE AND SCREEN   Collection Time    12/15/12  6:40 AM      Result Value Range   ABO/RH(D) AB POS     Antibody Screen NEG     Sample Expiration 12/18/2012    URINALYSIS, ROUTINE W REFLEX MICROSCOPIC   Collection Time    12/15/12  6:55 AM      Result Value Range   Color, Urine YELLOW  YELLOW   APPearance CLEAR  CLEAR   Specific Gravity, Urine <1.005 (*) 1.005 - 1.030   pH 6.5  5.0 - 8.0   Glucose, UA NEGATIVE  NEGATIVE mg/dL   Hgb urine dipstick  NEGATIVE  NEGATIVE   Bilirubin Urine NEGATIVE  NEGATIVE   Ketones, ur NEGATIVE  NEGATIVE mg/dL   Protein, ur NEGATIVE  NEGATIVE mg/dL   Urobilinogen, UA 0.2  0.0 - 1.0 mg/dL   Nitrite NEGATIVE  NEGATIVE   Leukocytes, UA TRACE (*) NEGATIVE  URINE MICROSCOPIC-ADD ON   Collection Time    12/15/12  6:55 AM      Result Value Range   Squamous Epithelial / LPF RARE  RARE   WBC, UA 3-6  <3 WBC/hpf   Bacteria, UA RARE  RARE    Imaging Studies:     Currently EPIC will not allow sonographic studies to automatically populate into notes.  In the meantime, copy and paste results into note or free text.  Medications:  Scheduled . docusate sodium  100 mg Oral Daily  . ferrous fumarate  1 tablet Oral BID  . hydroxyprogesterone caproate  250 mg Intramuscular Q7 days  . prenatal vitamin w/FE, FA  1 tablet Oral Q1200   I have reviewed the patient's current medications.  ASSESSMENT: There is no problem list on file for this patient. Gestational Htn Not meeting Severe criteria at present  PLAN: Inpatient care Delivery at 37 week, or at worsening fetal or maternal condition  Viktoriya Glaspy  V 12/15/2012,9:24 AM

## 2012-12-16 DIAGNOSIS — O365990 Maternal care for other known or suspected poor fetal growth, unspecified trimester, not applicable or unspecified: Secondary | ICD-10-CM | POA: Diagnosis present

## 2012-12-16 DIAGNOSIS — O139 Gestational [pregnancy-induced] hypertension without significant proteinuria, unspecified trimester: Secondary | ICD-10-CM | POA: Diagnosis present

## 2012-12-16 NOTE — Progress Notes (Signed)
FACULTY PRACTICE ANTEPARTUM PROGRESS NOTE  Karina [name] is a 27 y.o. [id number] at [redacted]w[redacted]d by early ultrasound who is admitted for [facility].  Fetal presentation is cephalic.  Length of Stay:  16  Days  Subjective:  Patient reports painful frequent contractions; unchanged cervical exam after 3 hours (1.5/50/-3/soft).  No bleeding or loss of fluid; endorses good fetal movement.   Vitals:  Blood pressure 142/95, pulse 78, temperature 98.4 F (36.9 C), temperature source Oral, resp. rate 20, height 5\' 5"  (1.651 m), weight 225 lb 3.2 oz (102.15 kg), last menstrual period 04/10/2012, SpO2 99.00%. Physical Examination: General- Alert, well appearing, and in no distress Heart - Normal rate and regular rhythm Abdomen - Soft, nontender, nondistended Cervix - 1.5/50/-3/soft and fetal presentation is cephalic  Extremities -  No cyanosis or edema and Homans sign is negative, no sign of DVT with DTRs 2+ bilaterally Membranes - Intact  Fetal Monitoring:  Baseline: 140 bpm, moderate variability, + accels, no decels  Toco:  Every 3-5 minutes  Imaging Studies:  12/15/12 EFW 2128g/14%, AFI 10.29 cm, BPP 8/10 (-2 FBM), normal dopplers, cephalic presentation  Medications:  Scheduled . docusate sodium  100 mg Oral Daily  . ferrous fumarate  1 tablet Oral BID  . hydroxyprogesterone caproate  250 mg Intramuscular Q7 days  . prenatal vitamin w/FE, FA  1 tablet Oral Q1200  I have reviewed the patient's current medications.  ASSESSMENT: Patient Active Problem List  Diagnosis  . Gestational Hypertension, antepartum  . SGA (small for gestational age), fetal, affecting care of mother, antepartum  No signs or symptoms of preeclampsia.   Frequent contractions, no evidence of preterm labor  PLAN: Continue close observation; will move to L&D if she progresses to preterm labor Routine antenatal care for now.  Tereso Newcomer, MD 12/16/2012,9:40 PM

## 2012-12-16 NOTE — Progress Notes (Signed)
Hospital Day: 56  S: No complaints today.  O: Blood pressure 132/87, pulse 81, temperature 98 F (36.7 C), temperature source Oral, resp. rate 18, height 5\' 5"  (1.651 m), weight 102.15 kg (225 lb 3.2 oz), last menstrual period 04/10/2012, SpO2 99.00%.   FHT:wnl, no deceleratons  Toco: Rare, irregular ZOX:WRUEAVWU: 1.5 Effacement (%): 50 Cervical Position: Posterior Station: Ballotable Presentation: Vertex Exam by:: c soliz rn  A/P- 27 y.o. admitted with: Gestational hypertension and headache Headache resolved. BPs still 140-150s systoloic and diastolics 80s-162mm of Hg Patient is stable on hospital bedrest. P: [place] at 37 weeks or sooner if clinically indicated.  Per MFM note EFW is up to 14 percentile, fluid normal, BPP 6/8      Pregnancy Complications: hypertension  Preterm labor management: no treatment necessary Dating:  [redacted]w[redacted]d  ROD: Vaginal

## 2012-12-17 MED ORDER — ZOLPIDEM TARTRATE 5 MG PO TABS
10.0000 mg | ORAL_TABLET | Freq: Every evening | ORAL | Status: DC | PRN
  Administered 2012-12-17 – 2012-12-23 (×7): 10 mg via ORAL
  Filled 2012-12-17 (×3): qty 2
  Filled 2012-12-17: qty 1
  Filled 2012-12-17: qty 2
  Filled 2012-12-17 (×2): qty 1
  Filled 2012-12-17: qty 2
  Filled 2012-12-17: qty 1

## 2012-12-17 NOTE — Progress Notes (Signed)
Wants to be left on toco, states she is starting to feel some contractions.

## 2012-12-17 NOTE — Progress Notes (Signed)
FACULTY PRACTICE ANTEPARTUM PROGRESS NOTE  Karina [name] is a 27 y.o. [id number] at [redacted]w[redacted]d by early ultrasound who is admitted for [facility]  Fetal presentation is cephalic.  Length of Stay:  17  Days  Subjective:  Patient reports painful irregular contractions; denies any bleeding or loss of fluid; endorses active  fetal movement.  Declines cervical check, also has no interest in outpatient management of her GHTN.  Vitals:  Blood pressure 143/95, pulse 75, temperature 98.2 F (36.8 C), temperature source Oral, resp. rate 20, height 5\' 5"  (1.651 m), weight 225 lb 3.2 oz (102.15 kg), last menstrual period 04/10/2012, SpO2 99.00%. Temp:  [97.1 F (36.2 C)-98.4 F (36.9 C)] 98.2 F (36.8 C) (04/23 0020) Pulse Rate:  [73-90] 75 (04/23 0020) Resp:  [18-24] 20 (04/23 0020) BP: (134-149)/(86-102) 143/95 mmHg (04/23 0020) SpO2:  [99 %] 99 % (04/22 1456) Physical Examination: General- Alert, well appearing, and in no distress Heart - Normal rate and regular rhythm Abdomen - Soft, nontender, nondistended Cervix - 1.5/50/-3/soft and fetal presentation is cephalic as of 12/16/12 night Extremities -  No cyanosis or edema and Homans sign is negative, no sign of DVT with DTRs 2+ bilaterally Membranes - Intact  Fetal Monitoring:  Baseline: 140 bpm, moderate variability, + accels, no decels  Toco:  Every 3-8 minutes  Imaging Studies:  12/15/12 EFW 2128g/14%, AFI 10.29 cm, BPP 8/10 (-2 FBM), normal dopplers, cephalic presentation  Medications:  Scheduled . docusate sodium  100 mg Oral Daily  . ferrous fumarate  1 tablet Oral BID  . hydroxyprogesterone caproate  250 mg Intramuscular Q7 days  . prenatal vitamin w/FE, FA  1 tablet Oral Q1200  I have reviewed the patient's current medications.  ASSESSMENT: Patient Active Problem List  Diagnosis  . Gestational Hypertension, antepartum  . SGA (small for gestational age), fetal, affecting care of mother, antepartum  No signs or symptoms of preeclampsia.    Frequent contractions, no evidence of preterm labor  PLAN: Continue close observation; will move to L&D if she progresses to preterm labor Stable BP, no signs/symptoms of severe GHTN or preeclampsia Routine antenatal care for now.  Tereso Newcomer, MD 12/17/2012,7:19 AM

## 2012-12-18 DIAGNOSIS — O139 Gestational [pregnancy-induced] hypertension without significant proteinuria, unspecified trimester: Secondary | ICD-10-CM

## 2012-12-18 DIAGNOSIS — O365990 Maternal care for other known or suspected poor fetal growth, unspecified trimester, not applicable or unspecified: Secondary | ICD-10-CM

## 2012-12-18 LAB — TYPE AND SCREEN
ABO/RH(D): AB POS
Antibody Screen: NEGATIVE

## 2012-12-18 MED ORDER — SODIUM CHLORIDE 0.9 % IJ SOLN
3.0000 mL | Freq: Two times a day (BID) | INTRAMUSCULAR | Status: DC
  Administered 2012-12-18 (×2): 3 mL via INTRAVENOUS

## 2012-12-18 NOTE — Progress Notes (Signed)
FACULTY PRACTICE ANTEPARTUM(COMPREHENSIVE) NOTE  Karina [name] is a 27 y.o. [id number] at [redacted]w[redacted]d who is admitted for gestational HTN.   Fetal presentation is cephalic. Length of Stay:  18  Days  Subjective: Pt having continued irregular contractions, perceived as uncomfortable, with no cervical change so far.    Patient reports the fetal movement as active. Patient reports uterine contraction  activity as irregular, every 5-10 minutes. Patient reports  vaginal bleeding as none. Patient describes fluid per vagina as None.  Vitals:  Blood pressure 155/99, pulse 79, temperature 97.9 F (36.6 C), temperature source Oral, resp. rate 20, height 5\' 5"  (1.651 m), weight 225 lb 3.2 oz (102.15 kg), last menstrual period 04/10/2012, SpO2 99.00%. Physical Examination:  General appearance - alert, well appearing, and in no distress Heart - normal rate and regular rhythm Abdomen - soft, nontender, nondistended Fundal Height:  size equals dates Cervical Exam: Not evaluated.. Extremities: extremities normal, atraumatic, no cyanosis or edema and Homans sign is negative, no sign of DVT with DTRs 1+ bilaterally Membranes:intact  Fetal Monitoring:  Daily NST reactive  Labs:  No results found for this or any previous visit (from the past 24 hour(s)).  Imaging Studies:    s re Currently EPIC will not allow sonographic studies to automatically populate into notes.  In the meantime, copy and paste results into note or free text.  Medications:  Scheduled . docusate sodium  100 mg Oral Daily  . ferrous fumarate  1 tablet Oral BID  . hydroxyprogesterone caproate  250 mg Intramuscular Q7 days  . prenatal vitamin w/FE, FA  1 tablet Oral Q1200   I have reviewed the patient's current medications.  ASSESSMENT: Patient Active Problem List  Diagnosis  . Gestational Hypertension, antepartum  . SGA (small for gestational age), fetal, affecting care of mother, antepartum  No signs or symptoms of preeclampsia.   Frequent contractions, no evidence of preterm labor    PLAN: *Continue close observation; will move to L&D if she progresses to preterm labor  Stable BP, no signs/symptoms of severe GHTN or preeclampsia  Routine antenatal care for now.     Karina [name] V 12/18/2012,7:52 AM

## 2012-12-18 NOTE — Progress Notes (Signed)
UR completed 

## 2012-12-19 NOTE — Progress Notes (Signed)
Patient ID: Karina [name], female   DOB: 11-03-1985, 28 y.o.   MRN: [id number] FACULTY PRACTICE ANTEPARTUM(COMPREHENSIVE) NOTE  Karina [name] is a 27 y.o. [id number] at [redacted]w[redacted]d by LMP who is admitted for gestational hypertension and inadequate fetal growth.   Fetal presentation is unsure. Length of Stay:  19  Days  Subjective: Headache resolved Patient reports the fetal movement as active. Patient reports uterine contraction  activity as BHC. Patient reports  vaginal bleeding as none. Patient describes fluid per vagina as None.  Vitals:  Blood pressure 144/101, pulse 77, temperature 97.8 F (36.6 C), temperature source Oral, resp. rate 18, height 5\' 5"  (1.651 m), weight 225 lb 3.2 oz (102.15 kg), last menstrual period 04/10/2012, SpO2 99.00%. Physical Examination:  General appearance - alert, well appearing, and in no distress Heart - normal rate and regular rhythm Abdomen - soft, nontender, nondistended Fundal Height:  size equals dates Cervical Exam: Not evaluated. Extremities: 1+ pedal edema Membranes:intact  Fetal Monitoring:  Baseline: 135 bpm, Variability: Good {> 6 bpm), Accelerations: Reactive and Decelerations: Absent  Labs:  Results for orders placed during the hospital encounter of 11/30/12 (from the past 24 hour(s))  TYPE AND SCREEN   Collection Time    12/18/12  7:15 PM      Result Value Range   ABO/RH(D) AB POS     Antibody Screen NEG     Sample Expiration 12/21/2012      Imaging Studies:      Medications:  Scheduled . docusate sodium  100 mg Oral Daily  . ferrous fumarate  1 tablet Oral BID  . hydroxyprogesterone caproate  250 mg Intramuscular Q7 days  . prenatal vitamin w/FE, FA  1 tablet Oral Q1200  . sodium chloride  3 mL Intravenous Q12H   I have reviewed the patient's current medications.  ASSESSMENT: Patient Active Problem List  Diagnosis  . Gestational Hypertension, antepartum  . SGA (small for gestational age), fetal, affecting care of  mother, antepartum    PLAN: Continue present management  Karina [name] 12/19/2012,7:48 AM

## 2012-12-20 NOTE — Progress Notes (Signed)
Pt. Remains at baby shower downstairs in the classroom.

## 2012-12-20 NOTE — Progress Notes (Signed)
Pt. To educational room for a baby shower as approved per Dr. Marice Potter at this time.

## 2012-12-20 NOTE — Progress Notes (Signed)
Patient ID: Karina [name], female   DOB: [birth year], 27 y.o.   MRN: [id number] Patient ID: Karina [name], female   DOB: 1986-05-10, 27 y.o.   MRN: [id number] FACULTY PRACTICE ANTEPARTUM(COMPREHENSIVE) NOTE  Karina [name] is a 27 y.o. [id number] at [redacted]w[redacted]d  by LMP who is admitted for gestational hypertension and inadequate fetal growth.   Fetal presentation is unsure. Length of Stay:  20  Days  Subjective: Headache resolved Patient reports the fetal movement as active. Patient reports uterine contraction  activity as BHC. Patient reports  vaginal bleeding as none. Patient describes fluid per vagina as None.  Vitals:  Blood pressure 143/97, pulse 77, temperature 98 F (36.7 C), temperature source Oral, resp. rate 18, height 5\' 5"  (1.651 m), weight 225 lb 3.2 oz (102.15 kg), last menstrual period 04/10/2012, SpO2 99.00%. Physical Examination:  General appearance - alert, well appearing, and in no distress Heart - normal rate and regular rhythm Abdomen - soft, nontender, nondistended Fundal Height:  size equals dates Cervical Exam: Not evaluated. Extremities: 1+ pedal edema Membranes:intact  Fetal Monitoring:  Baseline: 135 bpm, Variability: Good {> 6 bpm), Accelerations: Reactive and Decelerations: Absent  Labs:  No results found for this or any previous visit (from the past 24 hour(s)).  Imaging Studies:      Medications:  Scheduled . docusate sodium  100 mg Oral Daily  . ferrous fumarate  1 tablet Oral BID  . hydroxyprogesterone caproate  250 mg Intramuscular Q7 days  . prenatal vitamin w/FE, FA  1 tablet Oral Q1200  . sodium chloride  3 mL Intravenous Q12H   I have reviewed the patient's current medications.  ASSESSMENT: Patient Active Problem List  Diagnosis  . Gestational Hypertension, antepartum  . SGA (small for gestational age), fetal, affecting care of mother, antepartum    PLAN: Continue present management  Karina [name] H 12/20/2012,7:29 AM

## 2012-12-21 DIAGNOSIS — O139 Gestational [pregnancy-induced] hypertension without significant proteinuria, unspecified trimester: Principal | ICD-10-CM

## 2012-12-21 LAB — TYPE AND SCREEN: Antibody Screen: NEGATIVE

## 2012-12-21 NOTE — Progress Notes (Signed)
Patient ID: CARLISLE TORGESON, female   DOB: May 20, 1986, 27 y.o.   MRN: [id number] TAURUS WILLIS is a 27 y.o. [id number] at [redacted]w[redacted]d  by LMP who is admitted for gestational hypertension and inadequate fetal growth.  Fetal presentation is unsure.  Length of Stay: 21 Days  Subjective:  Headache resolved  Patient reports the fetal movement as active.  Patient reports uterine contraction activity as BHC.  Patient reports vaginal bleeding as none.  Patient describes fluid per vagina as None.  Vitals: Blood pressure 143/97, pulse 77, temperature 98 F (36.7 C), temperature source Oral, resp. rate 18, height 5\' 5"  (1.651 m), weight 225 lb 3.2 oz (102.15 kg), last menstrual period 04/10/2012, SpO2 99.00%.  Physical Examination:  General appearance - alert, well appearing, and in no distress  Heart - normal rate and regular rhythm  Abdomen - soft, nontender, nondistended  Fundal Height: size equals dates  Cervical Exam: Not evaluated.  Extremities: 1+ pedal edema  Membranes:intact  Fetal Monitoring: Baseline: 135 bpm, Variability: Good {> 6 bpm), Accelerations: Reactive and Decelerations: Absent  Labs:  No results found for this or any previous visit (from the past 24 hour(s)).  Imaging Studies:  Medications: Scheduled  .  docusate sodium  100 mg  Oral  Daily   .  ferrous fumarate  1 tablet  Oral  BID   .  hydroxyprogesterone caproate  250 mg  Intramuscular  Q7 days   .  prenatal vitamin w/FE, FA  1 tablet  Oral  Q1200   .  sodium chloride  3 mL  Intravenous  Q12H   I have reviewed the patient's current medications.  ASSESSMENT:  Patient Active Problem List   Diagnosis   .  Gestational Hypertension, antepartum   .  SGA (small for gestational age), fetal, affecting care of mother, antepartum   PLAN:  Continue present management

## 2012-12-22 ENCOUNTER — Ambulatory Visit (HOSPITAL_COMMUNITY): Payer: Medicaid Other

## 2012-12-22 NOTE — Progress Notes (Signed)
Patient ID: TONIQUE MENDONCA, female   DOB: 1986/08/05, 27 y.o.   MRN: [id number] Patient ID: ODESSIA ASLESON, female   DOB: [birth year], 27 y.o.   MRN: [id number] MELLISSA CONLEY is a 27 y.o. F6O1308 at [redacted]w[redacted]d by LMP who is admitted for gestational hypertension and inadequate fetal growth.  Fetal presentation is unsure.  Length of Stay: 22 Days  Subjective:  Headache resolved Having some contractions this am No other complaints Patient reports the fetal movement as active.  Patient reports uterine contraction activity as BHC.  Patient reports vaginal bleeding as none.  Patient describes fluid per vagina as None.  Vitals: Blood pressure 143/97, pulse 77, temperature 98 F (36.7 C), temperature source Oral, resp. rate 18, height 5\' 5"  (1.651 m), weight 225 lb 3.2 oz (102.15 kg), last menstrual period 04/10/2012, SpO2 99.00%.  Physical Examination:  General appearance - alert, well appearing, and in no distress  Heart - normal rate and regular rhythm  Abdomen - soft, nontender, nondistended  Fundal Height: size equals dates  Cervical Exam: Not evaluated.  Extremities: 1+ pedal edema  Membranes:intact  Fetal Monitoring: Baseline: 135 bpm, Variability: Good {> 6 bpm), Accelerations: Reactive and Decelerations: Absent  Labs:  No results found for this or any previous visit (from the past 24 hour(s)).  Imaging Studies:  Medications: Scheduled  .  docusate sodium  100 mg  Oral  Daily   .  ferrous fumarate  1 tablet  Oral  BID   .  hydroxyprogesterone caproate  250 mg  Intramuscular  Q7 days   .  prenatal vitamin w/FE, FA  1 tablet  Oral  Q1200   .  sodium chloride  3 mL  Intravenous  Q12H   I have reviewed the patient's current medications.  ASSESSMENT:  [redacted]w[redacted]d, Estimated Date of Delivery: 01/15/13  Patient Active Problem List   Diagnosis   .  Gestational Hypertension, antepartum   .  SGA (small for gestational age), fetal, affecting care of mother, antepartum   PLAN:  Continue present  management   EURE,LUTHER H 12/22/2012 7:53 AM

## 2012-12-22 NOTE — Progress Notes (Signed)
Ur chart review completed.  

## 2012-12-23 NOTE — Progress Notes (Signed)
Hospital Day: 78  S: Preterm labor symptoms: none  O: Blood pressure 142/87, pulse 75, temperature 98 F (36.7 C), temperature source Oral, resp. rate 18, height 5\' 5"  (1.651 m), weight 102.15 kg (225 lb 3.2 oz), last menstrual period 04/10/2012, SpO2 99.00%.   FHT:wnl Toco: None NFA:OZHYQMVH: 1.5 Effacement (%): 50 Cervical Position: Posterior Station: Ballotable Presentation: Vertex Exam by:: Dr Lynetta Mare  A/P- 27 y.o. admitted with gestational hypertension Induce delivery at 37 weeks per MFM consultation. Discussed with Dr. Sherrie George  Present on Admission:  . Gestational Hypertension, antepartum . SGA (small for gestational age), fetal, affecting care of mother, antepartum  Pregnancy Complications: hypertension  Preterm labor management: no treatment necessary Dating:  [redacted]w[redacted]d ROD: induced vaginal planned

## 2012-12-24 ENCOUNTER — Encounter (HOSPITAL_COMMUNITY): Payer: Self-pay

## 2012-12-24 ENCOUNTER — Inpatient Hospital Stay (HOSPITAL_COMMUNITY): Admit: 2012-12-24 | Payer: Medicaid Other

## 2012-12-24 LAB — CBC
Hemoglobin: 13.3 g/dL (ref 12.0–15.0)
MCH: 30.4 pg (ref 26.0–34.0)
MCHC: 34.8 g/dL (ref 30.0–36.0)
MCV: 87.2 fL (ref 78.0–100.0)
RBC: 4.38 MIL/uL (ref 3.87–5.11)

## 2012-12-24 LAB — TYPE AND SCREEN

## 2012-12-24 MED ORDER — OXYTOCIN BOLUS FROM INFUSION
500.0000 mL | INTRAVENOUS | Status: DC
  Administered 2012-12-25: 500 mL via INTRAVENOUS

## 2012-12-24 MED ORDER — OXYCODONE-ACETAMINOPHEN 5-325 MG PO TABS
1.0000 | ORAL_TABLET | ORAL | Status: DC | PRN
  Administered 2012-12-25 – 2012-12-27 (×7): 1 via ORAL
  Filled 2012-12-24 (×7): qty 1

## 2012-12-24 MED ORDER — OXYTOCIN 40 UNITS IN LACTATED RINGERS INFUSION - SIMPLE MED
62.5000 mL/h | INTRAVENOUS | Status: DC
  Filled 2012-12-24: qty 1000

## 2012-12-24 MED ORDER — ZOLPIDEM TARTRATE 5 MG PO TABS
5.0000 mg | ORAL_TABLET | Freq: Every evening | ORAL | Status: DC | PRN
  Administered 2012-12-24: 5 mg via ORAL
  Filled 2012-12-24: qty 1

## 2012-12-24 MED ORDER — OXYTOCIN 40 UNITS IN LACTATED RINGERS INFUSION - SIMPLE MED: 1.0000 m[IU]/min | INTRAVENOUS | Status: DC

## 2012-12-24 MED ORDER — MISOPROSTOL 25 MCG QUARTER TABLET
25.0000 ug | ORAL_TABLET | Freq: Once | ORAL | Status: AC
  Administered 2012-12-24: 25 ug via VAGINAL
  Filled 2012-12-24: qty 0.25

## 2012-12-24 MED ORDER — TERBUTALINE SULFATE 1 MG/ML IJ SOLN: 0.2500 mg | Freq: Once | INTRAMUSCULAR | Status: AC | PRN

## 2012-12-24 MED ORDER — ACETAMINOPHEN 325 MG PO TABS: 650.0000 mg | ORAL_TABLET | ORAL | Status: DC | PRN

## 2012-12-24 MED ORDER — LACTATED RINGERS IV SOLN
500.0000 mL | INTRAVENOUS | Status: DC | PRN
  Administered 2012-12-25: 500 mL via INTRAVENOUS

## 2012-12-24 MED ORDER — LIDOCAINE HCL (PF) 1 % IJ SOLN
30.0000 mL | INTRAMUSCULAR | Status: DC | PRN
  Filled 2012-12-24 (×2): qty 30

## 2012-12-24 MED ORDER — ONDANSETRON HCL 4 MG/2ML IJ SOLN
4.0000 mg | Freq: Four times a day (QID) | INTRAMUSCULAR | Status: DC | PRN
  Administered 2012-12-25: 4 mg via INTRAVENOUS
  Filled 2012-12-24: qty 2

## 2012-12-24 MED ORDER — LACTATED RINGERS IV SOLN
INTRAVENOUS | Status: DC
  Administered 2012-12-24 – 2012-12-25 (×2): via INTRAVENOUS

## 2012-12-24 MED ORDER — CITRIC ACID-SODIUM CITRATE 334-500 MG/5ML PO SOLN: 30.0000 mL | ORAL | Status: DC | PRN

## 2012-12-24 MED ORDER — IBUPROFEN 600 MG PO TABS
600.0000 mg | ORAL_TABLET | Freq: Four times a day (QID) | ORAL | Status: DC | PRN
  Filled 2012-12-24 (×8): qty 1

## 2012-12-24 MED ORDER — BUTORPHANOL TARTRATE 1 MG/ML IJ SOLN
1.0000 mg | INTRAMUSCULAR | Status: DC | PRN
  Administered 2012-12-25 (×2): 1 mg via INTRAVENOUS
  Filled 2012-12-24 (×2): qty 1

## 2012-12-24 NOTE — Progress Notes (Signed)
Hospital Day: 75  S: Preterm labor symptoms: none  O: Blood pressure 158/108, pulse 81, temperature 98.6 F (37 C), temperature source Oral, resp. rate 20, height 5\' 5"  (1.651 m), weight 101.606 kg (224 lb), last menstrual period 04/10/2012, SpO2 99.00%.   FHT:wnl Toco: no complaints of contractions today BJY:NWGNFAOZ: 1.5 Effacement (%): 50 Cervical Position: Posterior Station: Ballotable Presentation: Vertex Exam by:: Dr Lynetta Mare  A/P- 27 y.o. admitted  Hypertension and pregnancy  Present on Admission:  . Gestational Hypertension, antepartum . SGA (small for gestational age), fetal, affecting care of mother, antepartum P: Induction on Thursday at 37 weeks  Pregnancy Complications: hypertension  Preterm labor management: no treatment necessary Dating:  [redacted]w[redacted]d ROD: spontaneous vaginal expected

## 2012-12-25 ENCOUNTER — Encounter (HOSPITAL_COMMUNITY): Payer: Self-pay | Admitting: Anesthesiology

## 2012-12-25 ENCOUNTER — Inpatient Hospital Stay (HOSPITAL_COMMUNITY): Payer: Medicaid Other | Admitting: Anesthesiology

## 2012-12-25 ENCOUNTER — Inpatient Hospital Stay (HOSPITAL_COMMUNITY): Admit: 2012-12-25 | Payer: Medicaid Other

## 2012-12-25 ENCOUNTER — Encounter (HOSPITAL_COMMUNITY): Payer: Self-pay

## 2012-12-25 MED ORDER — LACTATED RINGERS IV SOLN
500.0000 mL | Freq: Once | INTRAVENOUS | Status: AC
  Administered 2012-12-25: 500 mL via INTRAVENOUS

## 2012-12-25 MED ORDER — PHENYLEPHRINE 40 MCG/ML (10ML) SYRINGE FOR IV PUSH (FOR BLOOD PRESSURE SUPPORT)
80.0000 ug | PREFILLED_SYRINGE | INTRAVENOUS | Status: DC | PRN
  Filled 2012-12-25: qty 2

## 2012-12-25 MED ORDER — EPHEDRINE 5 MG/ML INJ
10.0000 mg | INTRAVENOUS | Status: DC | PRN
  Filled 2012-12-25: qty 2

## 2012-12-25 MED ORDER — FENTANYL 2.5 MCG/ML BUPIVACAINE 1/10 % EPIDURAL INFUSION (WH - ANES)
14.0000 mL/h | INTRAMUSCULAR | Status: DC | PRN
  Filled 2012-12-25: qty 125

## 2012-12-25 MED ORDER — SODIUM CHLORIDE 0.9 % IV SOLN: 250.0000 mL | INTRAVENOUS | Status: DC | PRN

## 2012-12-25 MED ORDER — SODIUM CHLORIDE 0.9 % IJ SOLN: 3.0000 mL | INTRAMUSCULAR | Status: DC | PRN

## 2012-12-25 MED ORDER — ONDANSETRON HCL 4 MG PO TABS: 4.0000 mg | ORAL_TABLET | ORAL | Status: DC | PRN

## 2012-12-25 MED ORDER — PHENYLEPHRINE 40 MCG/ML (10ML) SYRINGE FOR IV PUSH (FOR BLOOD PRESSURE SUPPORT)
80.0000 ug | PREFILLED_SYRINGE | INTRAVENOUS | Status: DC | PRN
  Filled 2012-12-25: qty 2
  Filled 2012-12-25: qty 5

## 2012-12-25 MED ORDER — LIDOCAINE HCL (PF) 1 % IJ SOLN
INTRAMUSCULAR | Status: DC | PRN
  Administered 2012-12-25 (×2): 4 mL

## 2012-12-25 MED ORDER — BENZOCAINE-MENTHOL 20-0.5 % EX AERO
1.0000 "application " | INHALATION_SPRAY | CUTANEOUS | Status: DC | PRN
  Administered 2012-12-25: 1 via TOPICAL
  Filled 2012-12-25 (×3): qty 56

## 2012-12-25 MED ORDER — IBUPROFEN 600 MG PO TABS
600.0000 mg | ORAL_TABLET | Freq: Four times a day (QID) | ORAL | Status: DC
  Administered 2012-12-25 – 2012-12-27 (×8): 600 mg via ORAL
  Filled 2012-12-25 (×3): qty 1

## 2012-12-25 MED ORDER — SODIUM CHLORIDE 0.9 % IJ SOLN
3.0000 mL | Freq: Two times a day (BID) | INTRAMUSCULAR | Status: DC
  Administered 2012-12-25 – 2012-12-26 (×2): 3 mL via INTRAVENOUS

## 2012-12-25 MED ORDER — SENNOSIDES-DOCUSATE SODIUM 8.6-50 MG PO TABS
2.0000 | ORAL_TABLET | Freq: Every day | ORAL | Status: DC
  Administered 2012-12-26: 2 via ORAL

## 2012-12-25 MED ORDER — TETANUS-DIPHTH-ACELL PERTUSSIS 5-2.5-18.5 LF-MCG/0.5 IM SUSP: 0.5000 mL | Freq: Once | INTRAMUSCULAR | Status: DC

## 2012-12-25 MED ORDER — DIPHENHYDRAMINE HCL 50 MG/ML IJ SOLN: 12.5000 mg | INTRAMUSCULAR | Status: DC | PRN

## 2012-12-25 MED ORDER — EPHEDRINE 5 MG/ML INJ
10.0000 mg | INTRAVENOUS | Status: DC | PRN
  Filled 2012-12-25: qty 2
  Filled 2012-12-25: qty 4

## 2012-12-25 MED ORDER — SIMETHICONE 80 MG PO CHEW: 80.0000 mg | CHEWABLE_TABLET | ORAL | Status: DC | PRN

## 2012-12-25 MED ORDER — DIPHENHYDRAMINE HCL 25 MG PO CAPS: 25.0000 mg | ORAL_CAPSULE | Freq: Four times a day (QID) | ORAL | Status: DC | PRN

## 2012-12-25 MED ORDER — PRENATAL MULTIVITAMIN CH
1.0000 | ORAL_TABLET | Freq: Every day | ORAL | Status: DC
  Administered 2012-12-25 – 2012-12-27 (×2): 1 via ORAL
  Filled 2012-12-25 (×3): qty 1

## 2012-12-25 MED ORDER — DIBUCAINE 1 % RE OINT
1.0000 "application " | TOPICAL_OINTMENT | RECTAL | Status: DC | PRN
  Filled 2012-12-25: qty 28

## 2012-12-25 MED ORDER — ONDANSETRON HCL 4 MG/2ML IJ SOLN: 4.0000 mg | INTRAMUSCULAR | Status: DC | PRN

## 2012-12-25 MED ORDER — ZOLPIDEM TARTRATE 5 MG PO TABS: 5.0000 mg | ORAL_TABLET | Freq: Every evening | ORAL | Status: DC | PRN

## 2012-12-25 MED ORDER — WITCH HAZEL-GLYCERIN EX PADS: 1.0000 "application " | MEDICATED_PAD | CUTANEOUS | Status: DC | PRN

## 2012-12-25 MED ORDER — OXYCODONE-ACETAMINOPHEN 5-325 MG PO TABS
1.0000 | ORAL_TABLET | ORAL | Status: DC | PRN
  Administered 2012-12-26 – 2012-12-27 (×2): 1 via ORAL
  Filled 2012-12-25 (×2): qty 1

## 2012-12-25 MED ORDER — FENTANYL 2.5 MCG/ML BUPIVACAINE 1/10 % EPIDURAL INFUSION (WH - ANES)
INTRAMUSCULAR | Status: DC | PRN
  Administered 2012-12-25: 14 mL/h via EPIDURAL

## 2012-12-25 MED ORDER — LANOLIN HYDROUS EX OINT: TOPICAL_OINTMENT | CUTANEOUS | Status: DC | PRN

## 2012-12-25 NOTE — Anesthesia Procedure Notes (Signed)
Epidural Patient location during procedure: OB Start time: 12/25/2012 2:55 AM  Staffing Anesthesiologist: Jessamyn Watterson A. Performed by: anesthesiologist   Preanesthetic Checklist Completed: patient identified, site marked, surgical consent, pre-op evaluation, timeout performed, IV checked, risks and benefits discussed and monitors and equipment checked  Epidural Patient position: sitting Prep: site prepped and draped and DuraPrep Patient monitoring: continuous pulse ox and blood pressure Approach: midline Injection technique: LOR air  Needle:  Needle type: Tuohy  Needle gauge: 17 G Needle length: 9 cm and 9 Needle insertion depth: 6 cm Catheter type: closed end flexible Catheter size: 19 Gauge Catheter at skin depth: 11 cm Test dose: negative and Other  Assessment Events: blood not aspirated, injection not painful, no injection resistance, negative IV test and no paresthesia  Additional Notes Patient identified. Risks and benefits discussed including failed block, incomplete  Pain control, post dural puncture headache, nerve damage, paralysis, blood pressure Changes, nausea, vomiting, reactions to medications-both toxic and allergic and post Partum back pain. All questions were answered. Patient expressed understanding and wished to proceed. Sterile technique was used throughout procedure. Epidural site was Dressed with sterile barrier dressing. No paresthesias, signs of intravascular injection Or signs of intrathecal spread were encountered.  Patient was more comfortable after the epidural was dosed. Please see RN's note for documentation of vital signs and FHR which are stable.

## 2012-12-25 NOTE — Progress Notes (Signed)
Pt refused lab stick.  Anesthesia updated on pt status.  Risks on PIH and current CBC prior to epidural removal explained to pt.  Pt verbalized understanding.  Catheter removed without difficulty.

## 2012-12-25 NOTE — Anesthesia Postprocedure Evaluation (Signed)
  Anesthesia Post-op Note  Patient: Karina [name]  Procedure(s) Performed: * No procedures listed *  Patient Location: PACU and Women's Unit  Anesthesia Type:Epidural  Level of Consciousness: awake, alert  and oriented  Airway and Oxygen Therapy: Patient Spontanous Breathing  Post-op Pain: mild  Post-op Assessment: Patient's Cardiovascular Status Stable, Respiratory Function Stable, No signs of Nausea or vomiting, Adequate PO intake, Pain level controlled, No headache, No backache, No residual numbness and No residual motor weakness  Post-op Vital Signs: stable  Complications: No apparent anesthesia complications

## 2012-12-25 NOTE — Progress Notes (Signed)
Lab requested to room to obtain stat CBC prior to epidural removal.

## 2012-12-25 NOTE — Progress Notes (Signed)
UR completed 

## 2012-12-25 NOTE — Anesthesia Preprocedure Evaluation (Addendum)
Anesthesia Evaluation  Patient identified by MRN, date of birth, ID band Patient awake    Reviewed: Allergy & Precautions, H&P , Patient's Chart, lab work & pertinent test results  Airway Mallampati: III TM Distance: >3 FB Neck ROM: Full    Dental no notable dental hx. (+) Teeth Intact   Pulmonary neg pulmonary ROS, former smoker,          Cardiovascular hypertension, negative cardio ROS  Rhythm:Regular Rate:Normal     Neuro/Psych PSYCHIATRIC DISORDERS Depression    GI/Hepatic negative GI ROS, Neg liver ROS,   Endo/Other  Morbid obesity  Renal/GU negative Renal ROS  negative genitourinary   Musculoskeletal negative musculoskeletal ROS (+)   Abdominal (+) + obese,   Peds  Hematology negative hematology ROS (+)   Anesthesia Other Findings   Reproductive/Obstetrics (+) Pregnancy                         Anesthesia Physical Anesthesia Plan  ASA: III  Anesthesia Plan: Epidural   Post-op Pain Management:    Induction:   Airway Management Planned: Natural Airway  Additional Equipment:   Intra-op Plan:   Post-operative Plan:   Informed Consent: I have reviewed the patients History and Physical, chart, labs and discussed the procedure including the risks, benefits and alternatives for the proposed anesthesia with the patient or authorized representative who has indicated his/her understanding and acceptance.   Dental advisory given  Plan Discussed with: Anesthesiologist  Anesthesia Plan Comments:         Anesthesia Quick Evaluation

## 2012-12-25 NOTE — Progress Notes (Signed)
Post Partum Day 0 Subjective: no complaints, up ad lib and tolerating PO  Objective: Blood pressure 135/90, pulse 65, temperature 97.8 F (36.6 C), temperature source Oral, resp. rate 18, height 5\' 5"  (1.651 m), weight 101.606 kg (224 lb), last menstrual period 04/10/2012, SpO2 100.00%, unknown if currently breastfeeding.  Physical Exam:  General: alert, cooperative and no distress Lochia: appropriate Uterine Fundus: firm Episiotomy, laceration : DVT Evaluation: No evidence of DVT seen on physical exam.   Recent Labs  12/24/12 2055  HGB 13.3  HCT 38.2    Assessment/Plan: Stable s/p vaginal delivery this morning.  BPs stable 150s over 90s.  No c/o headache, epigastric pain or other sx of PIH. P: Routine postpartum care. Check labs tomorrow.   LOS: 25 days   Terease Marcotte E 12/25/2012, 8:38 PM

## 2012-12-25 NOTE — Progress Notes (Signed)
Pitocin not started at 0500 12/25/12 due to regular contraction pattern & changing cervix dilation. Will continue to evaluate.

## 2012-12-25 NOTE — Op Note (Addendum)
Delivery Note At 6:17 AM a viable and healthy female was delivered via    I arrived at 0620 and the baby was delivered and the placenta had not attended per CNM and then I delivered the placenta and examined the patient (Presentation: ; LOA ).  APGAR:9 ,9 ; weight 4#13 Placenta status: delivered , .  Cord: Marland Kitchen  Cord pH: n/a  Anesthesia: Epidural  Episiotomy: none Lacerations: none Suture Repair: n/a Est. Blood Loss (mL): 300  Mom to postpartum.  Baby to nursery-stable. Considered observation in NICU swecondary to LBW  GBS unknown and no antibiotics able to be given  Will be able to stay with mom Cheryl Stabenow H. 12/25/2012, 6:34 AM

## 2012-12-26 LAB — CBC
MCH: 30.3 pg (ref 26.0–34.0)
MCV: 88.1 fL (ref 78.0–100.0)
Platelets: 246 10*3/uL (ref 150–400)
RBC: 3.79 MIL/uL — ABNORMAL LOW (ref 3.87–5.11)
RDW: 14 % (ref 11.5–15.5)

## 2012-12-26 LAB — COMPREHENSIVE METABOLIC PANEL
ALT: 7 U/L (ref 0–35)
AST: 15 U/L (ref 0–37)
Albumin: 2.5 g/dL — ABNORMAL LOW (ref 3.5–5.2)
CO2: 26 mEq/L (ref 19–32)
Calcium: 10 mg/dL (ref 8.4–10.5)
Creatinine, Ser: 0.77 mg/dL (ref 0.50–1.10)
GFR calc non Af Amer: >90 mL/min (ref >90)
Sodium: 138 mEq/L (ref 135–145)
Total Protein: 6 g/dL (ref 6.0–8.3)

## 2012-12-26 NOTE — Progress Notes (Signed)
Karina [name] reported that she is doing well and that the baby is doing well.  She is in good spirits and does not have any reported needs at this time.    [address] [place] Pager, [phone] 1:02 PM   12/24/12 2044  Clinical Encounter Type  Visited With Patient  Visit Type Follow-up  Advance Directives (For Healthcare)  Advance Directive Patient does not have advance directive;Patient would not like information  Pre-existing out of facility DNR order (yellow form or pink MOST form) No

## 2012-12-26 NOTE — Progress Notes (Addendum)
Post Partum Day 1 Subjective: no complaints, voiding and tolerating PO  Objective: Blood pressure 145/105, pulse 66, temperature 98.1 F (36.7 C), temperature source Oral, resp. rate 18, height 5\' 5"  (1.651 m), weight 101.606 kg (224 lb), last menstrual period 04/10/2012, SpO2 99.00%, unknown if currently breastfeeding.  Physical Exam:  General: alert, cooperative and no distress Lochia: appropriate Uterine Fundus: firm Episiotomy, laceration : no significant drainage DVT Evaluation: No evidence of DVT seen on physical exam.   Recent Labs  12/24/12 2055 12/26/12 0545  HGB 13.3 11.5*  HCT 38.2 33.4*    Assessment/Plan: Doing well.  Blood pressures are improving postpartum.  Very few at predelivery levels. No complaints of headache or other symptoms of PIH. Plan for discharge tomorrow   LOS: 26 days   GREENE,ELEANOR E 12/26/2012, 3:13 PM

## 2012-12-27 NOTE — Progress Notes (Signed)
Discharge instructions reviewed with patient.  Patient states understanding of home care, medications, activity, signs/symptoms to report to MD and return MD office visit.  No home equipment needed.  Patient ambulated for discharge in stable condition with staff without incident.  

## 2012-12-27 NOTE — Progress Notes (Signed)
Patient ID: Karina [name], female   DOB: Dec 21, 1985, 27 y.o.   MRN: [id number] `see dc summary

## 2012-12-27 NOTE — Discharge Summary (Signed)
Obstetric Discharge Summary Reason for Admission: gestatioinal hypertension and fetal growth restiction Prenatal Procedures: NST and ultrasound Intrapartum Procedures: spontaneous vaginal delivery and induced delivered after one cytotec Postpartum Procedures: none Complications-Operative and Postpartum: none Hemoglobin  Date Value Range Status  12/26/2012 11.5* 12.0 - 15.0 g/dL Final     HCT  Date Value Range Status  12/26/2012 33.4* 36.0 - 46.0 % Final    Physical Exam:  General: alert, cooperative and no distress Lochia: appropriate Uterine Fundus: firm Incision: na DVT Evaluation: No evidence of DVT seen on physical exam. Negative Homan's sign. No cords or calf tenderness.  Discharge Diagnoses: Term Pregnancy-delivered gestastional hypertension low birth weight infant s/p induced svd  Discharge Information: Date: 12/27/2012 Activity: unrestricted Diet: routine Medications: PNV, Ibuprofen and tylenol Condition: stable Instructions: refer to practice specific booklet Discharge to: home   Newborn Data: Live born female  Birth Weight: 4 lb 13.6 oz (2200 g) APGAR: 9, 9  Home with mother.  Jarissa Sheriff H. 12/27/2012, 7:17 AM

## 2013-02-14 ENCOUNTER — Emergency Department (HOSPITAL_COMMUNITY)
Admission: EM | Admit: 2013-02-14 | Discharge: 2013-02-14 | Disposition: A | Discharge status: Home / Self Care | Payer: Medicaid Other | Attending: Emergency Medicine | Admitting: Emergency Medicine

## 2013-02-14 DIAGNOSIS — I10 Essential (primary) hypertension: Secondary | ICD-10-CM | POA: Insufficient documentation

## 2013-02-14 DIAGNOSIS — Z8742 Personal history of other diseases of the female genital tract: Secondary | ICD-10-CM | POA: Insufficient documentation

## 2013-02-14 DIAGNOSIS — K089 Disorder of teeth and supporting structures, unspecified: Secondary | ICD-10-CM | POA: Insufficient documentation

## 2013-02-14 DIAGNOSIS — K0889 Other specified disorders of teeth and supporting structures: Secondary | ICD-10-CM

## 2013-02-14 DIAGNOSIS — Z87891 Personal history of nicotine dependence: Secondary | ICD-10-CM | POA: Insufficient documentation

## 2013-02-14 DIAGNOSIS — Z8659 Personal history of other mental and behavioral disorders: Secondary | ICD-10-CM | POA: Insufficient documentation

## 2013-02-14 DIAGNOSIS — Z8619 Personal history of other infectious and parasitic diseases: Secondary | ICD-10-CM | POA: Insufficient documentation

## 2013-02-14 MED ORDER — HYDROCODONE-ACETAMINOPHEN 5-325 MG PO TABS: 1.0000 | ORAL_TABLET | Freq: Four times a day (QID) | ORAL | Status: DC | PRN

## 2013-02-14 MED ORDER — HYDROCODONE-ACETAMINOPHEN 5-325 MG PO TABS
1.0000 | ORAL_TABLET | Freq: Once | ORAL | Status: AC
  Administered 2013-02-14: 1 via ORAL
  Filled 2013-02-14: qty 1

## 2013-02-14 NOTE — ED Provider Notes (Signed)
History    This chart was scribed for Glade Nurse, non-physician practitioner working with Raeford Razor, MD by Leone Payor, ED Scribe. This patient was seen in room WTR8/WTR8 and the patient's care was started at 1908.   CSN: [id number]  Arrival date & time 02/14/13  1908   First MD Initiated Contact with Patient 02/14/13 2034      Chief Complaint  Patient presents with  . Dental Pain     The history is provided by the patient. No language interpreter was used.    HPI Comments: Karina [name] is a 27 y.o. female with no pertinent past medical history who presents to the Emergency Department complaining of ongoing, 10/10 constant, gradually worsening lower right dental pain starting over 2 months ago. (Nurse's note states bilateral, but pt states right side). States she needs several root canals which she has not had. She has taken tylenol and goody powders with no relief. She was seen today by and prescribed amoxicillin by Dr. Joelene Millin who told her she needed the root canal which she cannot afford. Patient denies fever, night sweats, chills, difficulty swallowing or opening mouth, SOB, nuchal rigidity or decreased ROM of neck.     Past Medical History  Diagnosis Date  . Fibroids   . Trichomonas   . Pregnancy   . Depression     Postpartum Depression with 1st pregnancy  . History of chlamydia   . Hypertension     gestational  . Pregnancy induced hypertension     Past Surgical History  Procedure Laterality Date  . Dilation and curettage of uterus    . Cervical cerclage      Family History  Problem Relation Age of Onset  . Hypertension Mother   . Hypertension Father     History  Substance Use Topics  . Smoking status: Former Smoker    Quit date: 08/12/2012  . Smokeless tobacco: Never Used  . Alcohol Use: No    OB History   Grav Para Term Preterm Abortions TAB SAB Ect Mult Living   5 2 1 1 3  0 3 0 0 2      Review of Systems  HENT: Positive for dental  problem. Negative for sore throat, trouble swallowing, neck stiffness and voice change.   Eyes: Negative for photophobia and visual disturbance.  Respiratory: Negative for shortness of breath.   Cardiovascular: Negative for chest pain.  Gastrointestinal: Negative for nausea and vomiting.  Musculoskeletal: Negative for back pain.  Skin: Negative for rash.  Neurological: Negative for weakness, light-headedness and numbness.  Psychiatric/Behavioral: The patient is not nervous/anxious.     Allergies  Review of patient's allergies indicates no known allergies.  Home Medications   Current Outpatient Rx  Name  Route  Sig  Dispense  Refill  . acetaminophen (TYLENOL) 325 MG tablet   Oral   Take 650 mg by mouth daily as needed for pain.         Marland Kitchen amoxicillin (AMOXIL) 500 MG capsule   Oral   Take 500 mg by mouth 2 (two) times daily.           BP 189/122  Pulse 73  Temp(Src) 98.9 F (37.2 C) (Oral)  Resp 16  SpO2 100%  Physical Exam  Nursing note and vitals reviewed. Constitutional: She is oriented to person, place, and time. She appears well-developed and well-nourished.  HENT:  Head: Normocephalic and atraumatic. No trismus in the jaw.  Mouth/Throat: No dental abscesses. No tonsillar abscesses.  Oropharynx is clear. Very tender at tooth #32 which is cracked. Erythema and edema of the underlying gingiva. Multiple other teeth have grounds or are previously extracted. Neck is nontender and supple without adenopathy or JVD.  Neck: Normal range of motion. Neck supple.  Cardiovascular: Normal rate.   Pulmonary/Chest: Effort normal. No respiratory distress. She has no wheezes.  Abdominal: Soft. There is no tenderness.  Musculoskeletal: Normal range of motion.  Neurological: She is alert and oriented to person, place, and time. No cranial nerve deficit.  Skin: Skin is warm and dry.  Psychiatric: She has a normal mood and affect.    ED Course  Procedures (including critical  care time)  DIAGNOSTIC STUDIES: Oxygen Saturation is 100% on RA, normal by my interpretation.    COORDINATION OF CARE: 8:53 PM Discussed treatment plan with pt at bedside and pt agreed to plan.   Labs Reviewed - No data to display No results found.   1. Pain, dental       MDM  Patient with dental pain.  No gross abscess.  Exam unconcerning for Ludwig's angina or spread of infection.  Pt already being treated with amoxicillin. Will send home with small amount of pain meds. Pt has already seen dentist but could not afford tx. Provided our on-call dentist and resource material. Discussed options for treatment with pt who understands and is in agreement with discharge plan.  I personally performed the services described in this documentation, which was scribed in my presence. The recorded information has been reviewed and is accurate.    Glade Nurse, PA-C 02/15/13 0111

## 2013-02-14 NOTE — ED Notes (Signed)
Pt c/o bilateral lower dental pain. Pt states she does not have a dentist. Pt states she has taken Goody's and Tylenol with no relief. Pt with no acute distress. Pt ambulatory with steady gait to exam room. Pt states her sister drove her here.

## 2013-02-14 NOTE — ED Notes (Signed)
Pt states she has a hx of high BP and is not taking any meds for it. Pt states her BP was very high when she was pregnant.

## 2013-02-21 NOTE — ED Provider Notes (Signed)
Medical screening examination/treatment/procedure(s) were performed by non-physician practitioner and as supervising physician I was immediately available for consultation/collaboration.  Raeford Razor, MD 02/21/13 [phone]

## 2013-09-10 ENCOUNTER — Emergency Department (HOSPITAL_BASED_OUTPATIENT_CLINIC_OR_DEPARTMENT_OTHER)
Admission: EM | Admit: 2013-09-10 | Discharge: 2013-09-10 | Disposition: A | Discharge status: Home / Self Care | Payer: Medicaid Other | Attending: Emergency Medicine | Admitting: Emergency Medicine

## 2013-09-10 ENCOUNTER — Encounter (HOSPITAL_BASED_OUTPATIENT_CLINIC_OR_DEPARTMENT_OTHER): Payer: Self-pay | Admitting: Emergency Medicine

## 2013-09-10 ENCOUNTER — Emergency Department (HOSPITAL_BASED_OUTPATIENT_CLINIC_OR_DEPARTMENT_OTHER): Payer: Medicaid Other

## 2013-09-10 DIAGNOSIS — O210 Mild hyperemesis gravidarum: Secondary | ICD-10-CM | POA: Insufficient documentation

## 2013-09-10 DIAGNOSIS — Z792 Long term (current) use of antibiotics: Secondary | ICD-10-CM | POA: Insufficient documentation

## 2013-09-10 DIAGNOSIS — R109 Unspecified abdominal pain: Secondary | ICD-10-CM

## 2013-09-10 DIAGNOSIS — Z87891 Personal history of nicotine dependence: Secondary | ICD-10-CM | POA: Insufficient documentation

## 2013-09-10 DIAGNOSIS — O169 Unspecified maternal hypertension, unspecified trimester: Secondary | ICD-10-CM | POA: Insufficient documentation

## 2013-09-10 DIAGNOSIS — O26899 Other specified pregnancy related conditions, unspecified trimester: Secondary | ICD-10-CM

## 2013-09-10 DIAGNOSIS — O219 Vomiting of pregnancy, unspecified: Secondary | ICD-10-CM

## 2013-09-10 DIAGNOSIS — Z8619 Personal history of other infectious and parasitic diseases: Secondary | ICD-10-CM | POA: Insufficient documentation

## 2013-09-10 DIAGNOSIS — R1013 Epigastric pain: Secondary | ICD-10-CM | POA: Insufficient documentation

## 2013-09-10 DIAGNOSIS — Z8659 Personal history of other mental and behavioral disorders: Secondary | ICD-10-CM | POA: Insufficient documentation

## 2013-09-10 LAB — CBC WITH DIFFERENTIAL/PLATELET
BASOS ABS: 0 10*3/uL (ref 0.0–0.1)
BASOS PCT: 0 % (ref 0–1)
EOS ABS: 0.1 10*3/uL (ref 0.0–0.7)
EOS PCT: 1 % (ref 0–5)
HEMATOCRIT: 40.8 % (ref 36.0–46.0)
Hemoglobin: 14.1 g/dL (ref 12.0–15.0)
Lymphocytes Relative: 30 % (ref 12–46)
Lymphs Abs: 2 10*3/uL (ref 0.7–4.0)
MCH: 31.1 pg (ref 26.0–34.0)
MCHC: 34.6 g/dL (ref 30.0–36.0)
MCV: 90.1 fL (ref 78.0–100.0)
MONO ABS: 0.7 10*3/uL (ref 0.1–1.0)
Monocytes Relative: 11 % (ref 3–12)
Neutro Abs: 3.8 10*3/uL (ref 1.7–7.7)
Neutrophils Relative %: 58 % (ref 43–77)
PLATELETS: 405 10*3/uL — AB (ref 150–400)
RBC: 4.53 MIL/uL (ref 3.87–5.11)
RDW: 13.2 % (ref 11.5–15.5)
WBC: 6.6 10*3/uL (ref 4.0–10.5)

## 2013-09-10 LAB — WET PREP, GENITAL
Trich, Wet Prep: NONE SEEN
YEAST WET PREP: NONE SEEN

## 2013-09-10 LAB — URINALYSIS, ROUTINE W REFLEX MICROSCOPIC
BILIRUBIN URINE: NEGATIVE
Glucose, UA: NEGATIVE mg/dL
HGB URINE DIPSTICK: NEGATIVE
Ketones, ur: 15 mg/dL — AB
Nitrite: NEGATIVE
PROTEIN: NEGATIVE mg/dL
Specific Gravity, Urine: 1.015 (ref 1.005–1.030)
UROBILINOGEN UA: 0.2 mg/dL (ref 0.0–1.0)
pH: 7 (ref 5.0–8.0)

## 2013-09-10 LAB — URINE MICROSCOPIC-ADD ON

## 2013-09-10 LAB — PREGNANCY, URINE: PREG TEST UR: POSITIVE — AB

## 2013-09-10 LAB — ABO/RH: ABO/RH(D): AB POS

## 2013-09-10 LAB — HCG, QUANTITATIVE, PREGNANCY: HCG, BETA CHAIN, QUANT, S: 36003 m[IU]/mL — AB (ref <5)

## 2013-09-10 MED ORDER — ONDANSETRON HCL 4 MG PO TABS: 4.0000 mg | ORAL_TABLET | Freq: Four times a day (QID) | ORAL | Status: DC

## 2013-09-10 MED ORDER — ONDANSETRON 8 MG PO TBDP
8.0000 mg | ORAL_TABLET | Freq: Once | ORAL | Status: AC
  Administered 2013-09-10: 8 mg via ORAL
  Filled 2013-09-10: qty 1

## 2013-09-10 NOTE — ED Provider Notes (Signed)
CSN: [id number]     Arrival date & time 09/10/13  1504 History   First MD Initiated Contact with Patient 09/10/13 1523     Chief Complaint  Patient presents with  . Abdominal Pain  . Emesis   (Consider location/radiation/quality/duration/timing/severity/associated sxs/prior Treatment) Patient is a 28 y.o. female presenting with abdominal pain and vomiting. The history is provided by the patient.  Abdominal Pain Pain location:  Epigastric Pain quality: bloating and pressure   Pain radiates to:  Does not radiate Pain severity:  Moderate Onset quality:  Gradual Duration:  3 days Timing:  Constant Progression:  Worsening Chronicity:  New Relieved by:  None tried Exacerbated by: smelling food. Associated symptoms: nausea and vomiting   Emesis Associated symptoms: abdominal pain    Karina [name] is a 28 y.o. female who presents to the ED with nausea and epigastric pain x 3 days. She reports frequent urination. LMP 07/27/2013 and was normal. She has IUD for birth control. Current sex partner x 3 months. Hx of Chlamydia last month trichomonas. G3, P2, SAB x1. She reports white vaginal discharge. Patient has a history of HTN and has not taken her medication this week due to nausea.    Past Medical History  Diagnosis Date  . Fibroids   . Trichomonas   . Pregnancy   . Depression     Postpartum Depression with 1st pregnancy  . History of chlamydia   . Hypertension     gestational  . Pregnancy induced hypertension    Past Surgical History  Procedure Laterality Date  . Dilation and curettage of uterus    . Cervical cerclage     Family History  Problem Relation Age of Onset  . Hypertension Mother   . Hypertension Father    History  Substance Use Topics  . Smoking status: Former Smoker    Quit date: 08/12/2012  . Smokeless tobacco: Never Used  . Alcohol Use: No   OB History   Grav Para Term Preterm Abortions TAB SAB Ect Mult Living   5 2 1 1 3  0 3 0 0 2     Review of  Systems  Gastrointestinal: Positive for nausea, vomiting and abdominal pain.    Allergies  Review of patient's allergies indicates no known allergies.  Home Medications   Current Outpatient Rx  Name  Route  Sig  Dispense  Refill  . acetaminophen (TYLENOL) 325 MG tablet   Oral   Take 650 mg by mouth daily as needed for pain.         Marland Kitchen amoxicillin (AMOXIL) 500 MG capsule   Oral   Take 500 mg by mouth 2 (two) times daily.         Marland Kitchen HYDROcodone-acetaminophen (NORCO/VICODIN) 5-325 MG per tablet   Oral   Take 1 tablet by mouth every 6 (six) hours as needed for pain.   8 tablet   0    BP 148/93  Pulse 78  Temp(Src) 98.4 F (36.9 C) (Oral)  Resp 18  Ht 5\' 5"  (1.651 m)  Wt 210 lb (95.255 kg)  BMI 34.95 kg/m2  SpO2 100%  LMP 07/27/2013 Physical Exam  Nursing note and vitals reviewed. Constitutional: She is oriented to person, place, and time. She appears well-developed and well-nourished.  HENT:  Head: Normocephalic and atraumatic.  Eyes: EOM are normal.  Neck: Neck supple.  Cardiovascular: Normal rate and regular rhythm.   Pulmonary/Chest: Effort normal and breath sounds normal.  Abdominal: Soft. There is tenderness in  the right lower quadrant, suprapubic area and left lower quadrant. There is no rebound and no guarding.  Tenderness is mild  Genitourinary:  External genitalia without lesions. Frothy, bloody malodorous discharge vaginal vault. IUD string not visible, cervical os inflamed, yellow discharge from os. No CMT, mild adnexal tenderness bilateral. Uterus slightly enlarged.   Musculoskeletal: Normal range of motion.  Neurological: She is alert and oriented to person, place, and time. No cranial nerve deficit.  Skin: Skin is warm and dry.  Psychiatric: She has a normal mood and affect. Her behavior is normal.    Results for orders placed during the hospital encounter of 09/10/13 (from the past 24 hour(s))  URINALYSIS, ROUTINE W REFLEX MICROSCOPIC     Status:  Abnormal   Collection Time    09/10/13  3:26 PM      Result Value Range   Color, Urine YELLOW  YELLOW   APPearance CLOUDY (*) CLEAR   Specific Gravity, Urine 1.015  1.005 - 1.030   pH 7.0  5.0 - 8.0   Glucose, UA NEGATIVE  NEGATIVE mg/dL   Hgb urine dipstick NEGATIVE  NEGATIVE   Bilirubin Urine NEGATIVE  NEGATIVE   Ketones, ur 15 (*) NEGATIVE mg/dL   Protein, ur NEGATIVE  NEGATIVE mg/dL   Urobilinogen, UA 0.2  0.0 - 1.0 mg/dL   Nitrite NEGATIVE  NEGATIVE   Leukocytes, UA LARGE (*) NEGATIVE  PREGNANCY, URINE     Status: Abnormal   Collection Time    09/10/13  3:26 PM      Result Value Range   Preg Test, Ur POSITIVE (*) NEGATIVE  URINE MICROSCOPIC-ADD ON     Status: Abnormal   Collection Time    09/10/13  3:26 PM      Result Value Range   Squamous Epithelial / LPF FEW (*) RARE   WBC, UA 7-10  <3 WBC/hpf   RBC / HPF 0-2  <3 RBC/hpf   Bacteria, UA MANY (*) RARE  CBC WITH DIFFERENTIAL     Status: Abnormal   Collection Time    09/10/13  4:25 PM      Result Value Range   WBC 6.6  4.0 - 10.5 K/uL   RBC 4.53  3.87 - 5.11 MIL/uL   Hemoglobin 14.1  12.0 - 15.0 g/dL   HCT 40.8  36.0 - 46.0 %   MCV 90.1  78.0 - 100.0 fL   MCH 31.1  26.0 - 34.0 pg   MCHC 34.6  30.0 - 36.0 g/dL   RDW 13.2  11.5 - 15.5 %   Platelets 405 (*) 150 - 400 K/uL   Neutrophils Relative % 58  43 - 77 %   Neutro Abs 3.8  1.7 - 7.7 K/uL   Lymphocytes Relative 30  12 - 46 %   Lymphs Abs 2.0  0.7 - 4.0 K/uL   Monocytes Relative 11  3 - 12 %   Monocytes Absolute 0.7  0.1 - 1.0 K/uL   Eosinophils Relative 1  0 - 5 %   Eosinophils Absolute 0.1  0.0 - 0.7 K/uL   Basophils Relative 0  0 - 1 %   Basophils Absolute 0.0  0.0 - 0.1 K/uL  HCG, QUANTITATIVE, PREGNANCY     Status: Abnormal   Collection Time    09/10/13  4:25 PM      Result Value Range   hCG, Beta Chain, Quant, S 36003 (*) <5 mIU/mL  ABO/RH     Status: None   Collection  Time    09/10/13  4:25 PM      Result Value Range   ABO/RH(D) AB POS     No  rh immune globuloin       Value: NOT A RH IMMUNE GLOBULIN CANDIDATE, PT RH POSITIVE     Performed at [facility], [facility]     Status: Abnormal   Collection Time    09/10/13  4:38 PM      Result Value Range   Yeast Wet Prep HPF POC NONE SEEN  NONE SEEN   Trich, Wet Prep NONE SEEN  NONE SEEN   Clue Cells Wet Prep HPF POC MANY (*) NONE SEEN   WBC, Wet Prep HPF POC MODERATE (*) NONE SEEN    US Ob Comp Less 14 Wks  09/10/2013   CLINICAL DATA:  Pelvic pain. Gestational age by LMP of 6 weeks 3 days.  EXAM: OBSTETRIC <14 WK [place] AND TRANSVAGINAL OB US  TECHNIQUE: Both transabdominal and transvaginal ultrasound examinations were performed for complete evaluation of the gestation as well as the maternal uterus, adnexal regions, and pelvic cul-de-sac. Transvaginal technique was performed to assess early pregnancy.  COMPARISON:  None.  FINDINGS: Intrauterine gestational sac: Visualized/normal in shape.  Yolk sac:  Visualized  Embryo:  Visualized  Cardiac Activity: Visualized  Heart Rate:  117 bpm  CRL:   6  mm   6 w 2 d                  [place] EDC: 05/03/2014  Maternal uterus/adnexae: Both ovaries are normal in appearance. No adnexal mass or free fluid identified.  IMPRESSION: Single living IUP measuring 6 weeks 2 days, which is concordant with LMP.  No significant maternal uterine or adnexal abnormality identified.   Electronically Signed   By: Earle Gell M.D.   On: 09/10/2013 19:10   US Ob Transvaginal  09/10/2013   CLINICAL DATA:  Pelvic pain. Gestational age by LMP of 6 weeks 3 days.  EXAM: OBSTETRIC <14 WK [place] AND TRANSVAGINAL OB US  TECHNIQUE: Both transabdominal and transvaginal ultrasound examinations were performed for complete evaluation of the gestation as well as the maternal uterus, adnexal regions, and pelvic cul-de-sac. Transvaginal technique was performed to assess early pregnancy.  COMPARISON:  None.  FINDINGS: Intrauterine gestational sac: Visualized/normal in shape.  Yolk sac:   Visualized  Embryo:  Visualized  Cardiac Activity: Visualized  Heart Rate:  117 bpm  CRL:   6  mm   6 w 2 d                  [place] EDC: 05/03/2014  Maternal uterus/adnexae: Both ovaries are normal in appearance. No adnexal mass or free fluid identified.  IMPRESSION: Single living IUP measuring 6 weeks 2 days, which is concordant with LMP.  No significant maternal uterine or adnexal abnormality identified.   Electronically Signed   By: Earle Gell M.D.   On: 09/10/2013 19:10    ED Course: labs, exam, ultrasound  Procedures  Positive UTP, since patient has IUD will draw lab for BHCG to assure this is not a false pregnancy. Will ultrasound for verification of IUD and to assure this is not an ectopic pregnancy since patient has had STI's and multiple partners.  Discussed plan with patient and she agrees. MDM  28 y.o. female with nausea, vomiting and epigastric pain. Positive pregnancy test. Ultrasound show no IUD. Will treat patient's nausea and encouraged her to follow up with  her OB to start prenatal care. Patient is not sure if she wants to continue the pregnancy. She will make the decision with her friend. She will return as needed.  Discussed with the patient and all questioned fully answered.    Medication List    TAKE these medications       ondansetron 4 MG tablet  Commonly known as:  ZOFRAN  Take 1 tablet (4 mg total) by mouth every 6 (six) hours.      ASK your doctor about these medications       acetaminophen 325 MG tablet  Commonly known as:  TYLENOL  Take 650 mg by mouth daily as needed for pain.     amoxicillin 500 MG capsule  Commonly known as:  AMOXIL  Take 500 mg by mouth 2 (two) times daily.     HYDROcodone-acetaminophen 5-325 MG per tablet  Commonly known as:  NORCO/VICODIN  Take 1 tablet by mouth every 6 (six) hours as needed for pain.         Ashley [place], [place] 09/10/13 [phone]

## 2013-09-10 NOTE — ED Notes (Signed)
Abdominal pain in her upper mid abdomen and vomiting. Vaginal discharge. States her fever was 100.8 yesterday.

## 2013-09-11 LAB — URINE CULTURE

## 2013-09-11 LAB — GC/CHLAMYDIA PROBE AMP
CT Probe RNA: NEGATIVE
GC PROBE AMP APTIMA: NEGATIVE

## 2013-09-11 NOTE — ED Provider Notes (Signed)
Medical screening examination/treatment/procedure(s) were performed by non-physician practitioner and as supervising physician I was immediately available for consultation/collaboration.     Veryl Speak, MD 09/11/13 [phone]

## 2013-09-28 ENCOUNTER — Encounter (HOSPITAL_BASED_OUTPATIENT_CLINIC_OR_DEPARTMENT_OTHER): Payer: Self-pay | Admitting: Emergency Medicine

## 2013-09-28 ENCOUNTER — Emergency Department (HOSPITAL_BASED_OUTPATIENT_CLINIC_OR_DEPARTMENT_OTHER)
Admission: EM | Admit: 2013-09-28 | Discharge: 2013-09-28 | Disposition: A | Discharge status: Home / Self Care | Payer: Medicaid Other | Attending: Emergency Medicine | Admitting: Emergency Medicine

## 2013-09-28 DIAGNOSIS — R21 Rash and other nonspecific skin eruption: Secondary | ICD-10-CM | POA: Insufficient documentation

## 2013-09-28 DIAGNOSIS — R209 Unspecified disturbances of skin sensation: Secondary | ICD-10-CM | POA: Insufficient documentation

## 2013-09-28 DIAGNOSIS — O139 Gestational [pregnancy-induced] hypertension without significant proteinuria, unspecified trimester: Secondary | ICD-10-CM | POA: Insufficient documentation

## 2013-09-28 DIAGNOSIS — Z792 Long term (current) use of antibiotics: Secondary | ICD-10-CM | POA: Insufficient documentation

## 2013-09-28 DIAGNOSIS — O9989 Other specified diseases and conditions complicating pregnancy, childbirth and the puerperium: Secondary | ICD-10-CM | POA: Insufficient documentation

## 2013-09-28 DIAGNOSIS — B029 Zoster without complications: Secondary | ICD-10-CM | POA: Insufficient documentation

## 2013-09-28 DIAGNOSIS — Z8742 Personal history of other diseases of the female genital tract: Secondary | ICD-10-CM | POA: Insufficient documentation

## 2013-09-28 DIAGNOSIS — Z8659 Personal history of other mental and behavioral disorders: Secondary | ICD-10-CM | POA: Insufficient documentation

## 2013-09-28 DIAGNOSIS — Z87891 Personal history of nicotine dependence: Secondary | ICD-10-CM | POA: Insufficient documentation

## 2013-09-28 MED ORDER — ACYCLOVIR 400 MG PO TABS: 800.0000 mg | ORAL_TABLET | Freq: Every day | ORAL | Status: DC

## 2013-09-28 MED ORDER — HYDROCODONE-ACETAMINOPHEN 5-325 MG PO TABS: 1.0000 | ORAL_TABLET | Freq: Four times a day (QID) | ORAL | Status: DC | PRN

## 2013-09-28 NOTE — Discharge Instructions (Signed)
Shingles Shingles (herpes zoster) is an infection that is caused by the same virus that causes chickenpox (varicella). The infection causes a painful skin rash and fluid-filled blisters, which eventually break open, crust over, and heal. It may occur in any area of the body, but it usually affects only one side of the body or face. The pain of shingles usually lasts about 1 month. However, some people with shingles may develop long-term (chronic) pain in the affected area of the body. Shingles often occurs many years after the person had chickenpox. It is more common:  In people older than 50 years.  In people with weakened immune systems, such as those with HIV, AIDS, or cancer.  In people taking medicines that weaken the immune system, such as transplant medicines.  In people under great stress. CAUSES  Shingles is caused by the varicella zoster virus (VZV), which also causes chickenpox. After a person is infected with the virus, it can remain in the person's body for years in an inactive state (dormant). To cause shingles, the virus reactivates and breaks out as an infection in a nerve root. The virus can be spread from person to person (contagious) through contact with open blisters of the shingles rash. It will only spread to people who have not had chickenpox. When these people are exposed to the virus, they may develop chickenpox. They will not develop shingles. Once the blisters scab over, the person is no longer contagious and cannot spread the virus to others. SYMPTOMS  Shingles shows up in stages. The initial symptoms may be pain, itching, and tingling in an area of the skin. This pain is usually described as burning, stabbing, or throbbing.In a few days or weeks, a painful red rash will appear in the area where the pain, itching, and tingling were felt. The rash is usually on one side of the body in a band or belt-like pattern. Then, the rash usually turns into fluid-filled blisters. They  will scab over and dry up in approximately 2 3 weeks. Flu-like symptoms may also occur with the initial symptoms, the rash, or the blisters. These may include:  Fever.  Chills.  Headache.  Upset stomach. DIAGNOSIS  Your caregiver will perform a skin exam to diagnose shingles. Skin scrapings or fluid samples may also be taken from the blisters. This sample will be examined under a microscope or sent to a lab for further testing. TREATMENT  There is no specific cure for shingles. Your caregiver will likely prescribe medicines to help you manage the pain, recover faster, and avoid long-term problems. This may include antiviral drugs, anti-inflammatory drugs, and pain medicines. HOME CARE INSTRUCTIONS   Take a cool bath or apply cool compresses to the area of the rash or blisters as directed. This may help with the pain and itching.   Only take over-the-counter or prescription medicines as directed by your caregiver.   Rest as directed by your caregiver.  Keep your rash and blisters clean with mild soap and cool water or as directed by your caregiver.  Do not pick your blisters or scratch your rash. Apply an anti-itch cream or numbing creams to the affected area as directed by your caregiver.  Keep your shingles rash covered with a loose bandage (dressing).  Avoid skin contact with:  Babies.   Pregnant women.   Children with eczema.   Elderly people with transplants.   People with chronic illnesses, such as leukemia or AIDS.   Wear loose-fitting clothing to help ease   the pain of material rubbing against the rash.  Keep all follow-up appointments with your caregiver.If the area involved is on your face, you may receive a referral for follow-up to a specialist, such as an eye doctor (ophthalmologist) or an ear, nose, and throat (ENT) doctor. Keeping all follow-up appointments will help you avoid eye complications, chronic pain, or disability.  SEEK IMMEDIATE MEDICAL  CARE IF:   You have facial pain, pain around the eye area, or loss of feeling on one side of your face.  You have ear pain or ringing in your ear.  You have loss of taste.  Your pain is not relieved with prescribed medicines.   Your redness or swelling spreads.   You have more pain and swelling.  Your condition is worsening or has changed.   You have a feveror persistent symptoms for more than 2 3 days.  You have a fever and your symptoms suddenly get worse. MAKE SURE YOU:  Understand these instructions.  Will watch your condition.  Will get help right away if you are not doing well or get worse. Document Released: 08/13/2005 Document Revised: 05/07/2012 Document Reviewed: 03/27/2012 [employer] Patient Information 2014 [employer].  

## 2013-09-28 NOTE — ED Notes (Signed)
Woke yesterday with a itchy, blistery rash on her nose. She is [redacted] weeks pregnant.

## 2013-09-28 NOTE — ED Provider Notes (Signed)
CSN: [id number]     Arrival date & time 09/28/13  1842 History  This chart was scribed for Osvaldo Shipper, MD by Celesta Gentile, ED Scribe. The patient was seen in room MH05/MH05. Patient's care was started at 7:15 PM.  Chief Complaint  Patient presents with  . Rash   Patient is a 28 y.o. female presenting with rash. The history is provided by the patient. No language interpreter was used.  Rash Location:  Face Facial rash location:  Nose Quality: blistering, itchiness and redness   Severity:  Moderate Onset quality:  Sudden Duration:  1 day Timing:  Constant Progression:  Worsening Chronicity:  New Context: pregnancy   Context: not sick contacts   Relieved by:  Nothing Worsened by:  Nothing tried Ineffective treatments:  Moisturizers Associated symptoms: no abdominal pain, no diarrhea, no fever, no nausea, no shortness of breath, no sore throat, no URI and not vomiting    HPI Comments: BRIGIT DOKE is a 28 y.o. female who presents to the Emergency Department complaining of a worsening rash located on the tip of her nose that suddenly onset yesterday morning.  Pt states that she was sick with a stomach virus on Saturday, but she woke yesterday with the rash.  Pt states that the rash is itchy and tingling.  Pt denies otalgia, eye pain, and dysphagia.  She states she tried Vasoline without relief.  Pt reports that she did have the chicken pox when she was younger.  Pt is [redacted] weeks pregnant.  She states that she was seen here for her pregnancy.    Past Medical History  Diagnosis Date  . Fibroids   . Trichomonas   . Pregnancy   . Depression     Postpartum Depression with 1st pregnancy  . History of chlamydia   . Hypertension     gestational  . Pregnancy induced hypertension    Past Surgical History  Procedure Laterality Date  . Dilation and curettage of uterus    . Cervical cerclage     Family History  Problem Relation Age of Onset  . Hypertension Mother   .  Hypertension Father    History  Substance Use Topics  . Smoking status: Former Smoker    Quit date: 08/12/2012  . Smokeless tobacco: Never Used  . Alcohol Use: No   OB History   Grav Para Term Preterm Abortions TAB SAB Ect Mult Living   6 2 1 1 3  0 3 0 0 2     Review of Systems  Constitutional: Negative for fever and chills.  HENT: Negative for congestion, rhinorrhea and sore throat.   Respiratory: Negative for cough and shortness of breath.   Cardiovascular: Negative for chest pain.  Gastrointestinal: Negative for nausea, vomiting, abdominal pain and diarrhea.  Musculoskeletal: Negative for back pain.  Skin: Positive for rash. Negative for color change.  Neurological: Negative for syncope.  All other systems reviewed and are negative.    Allergies  Review of patient's allergies indicates no known allergies.  Home Medications   Current Outpatient Rx  Name  Route  Sig  Dispense  Refill  . BENAZEPRIL-HYDROCHLOROTHIAZIDE PO   Oral   Take by mouth.         Marland Kitchen acetaminophen (TYLENOL) 325 MG tablet   Oral   Take 650 mg by mouth daily as needed for pain.         Marland Kitchen amoxicillin (AMOXIL) 500 MG capsule   Oral   Take 500 mg  by mouth 2 (two) times daily.         Marland Kitchen HYDROcodone-acetaminophen (NORCO/VICODIN) 5-325 MG per tablet   Oral   Take 1 tablet by mouth every 6 (six) hours as needed for pain.   8 tablet   0   . ondansetron (ZOFRAN) 4 MG tablet   Oral   Take 1 tablet (4 mg total) by mouth every 6 (six) hours.   20 tablet   0    Triage Vitals: BP 134/100  Pulse 75  Temp(Src) 97.4 F (36.3 C) (Oral)  Resp 20  Wt 210 lb (95.255 kg)  SpO2 100%  LMP 07/27/2013 Physical Exam  Nursing note and vitals reviewed. Constitutional: She is oriented to person, place, and time. She appears well-developed and well-nourished. No distress.  HENT:  Head: Normocephalic and atraumatic.  Right Ear: External ear normal.  Left Ear: External ear normal.  Eyes: Conjunctivae  and EOM are normal. Right eye exhibits no discharge. Left eye exhibits no discharge.  Neck: Neck supple. No tracheal deviation present.  Cardiovascular: Normal rate.   Pulmonary/Chest: Effort normal. No respiratory distress.  Musculoskeletal: Normal range of motion.  Neurological: She is alert and oriented to person, place, and time.  Skin: Skin is warm and dry. Rash noted.  Erythematous vesicles on the tip of her nose.   Psychiatric: She has a normal mood and affect. Her behavior is normal.    ED Course  Procedures (including critical care time) DIAGNOSTIC STUDIES: Oxygen Saturation is 100% on RA, normal by my interpretation.    COORDINATION OF CARE: 7:25 PM-Patient informed of current plan of treatment and evaluation and agrees with plan.    Labs Review Labs Reviewed - No data to display Imaging Review No results found.  EKG Interpretation   None       MDM   1. Zoster    49F presents with lesions on tip of nose. Also having burning sensation over L nare. No eye pain, blurry vision. No tinnitus, ear pain. Is currently pregnant, but is not going to keep the baby.  AFVSS here. Vesicles on nose with mild erythema c/w zoster. No other facial involvement. Eye well, without pain, no need to stain. Ear well appearing, no vesicles in the ear canal or on the ear drum. No concern for Ramsay Hunt. Will treat with pain meds, acyclovir. Stable for discharge.   I personally performed the services described in this documentation, which was scribed in my presence. The recorded information has been reviewed and is accurate.     Osvaldo Shipper, MD 09/28/13 [phone]

## 2013-10-19 ENCOUNTER — Encounter (HOSPITAL_BASED_OUTPATIENT_CLINIC_OR_DEPARTMENT_OTHER): Payer: Self-pay | Admitting: Emergency Medicine

## 2013-10-19 ENCOUNTER — Emergency Department (HOSPITAL_BASED_OUTPATIENT_CLINIC_OR_DEPARTMENT_OTHER)
Admission: EM | Admit: 2013-10-19 | Discharge: 2013-10-19 | Disposition: A | Discharge status: Home / Self Care | Payer: Medicaid Other | Attending: Emergency Medicine | Admitting: Emergency Medicine

## 2013-10-19 ENCOUNTER — Emergency Department (HOSPITAL_BASED_OUTPATIENT_CLINIC_OR_DEPARTMENT_OTHER): Payer: Medicaid Other

## 2013-10-19 DIAGNOSIS — Z8742 Personal history of other diseases of the female genital tract: Secondary | ICD-10-CM | POA: Insufficient documentation

## 2013-10-19 DIAGNOSIS — O9989 Other specified diseases and conditions complicating pregnancy, childbirth and the puerperium: Secondary | ICD-10-CM | POA: Insufficient documentation

## 2013-10-19 DIAGNOSIS — R52 Pain, unspecified: Secondary | ICD-10-CM | POA: Insufficient documentation

## 2013-10-19 DIAGNOSIS — R000 Tachycardia, unspecified: Secondary | ICD-10-CM | POA: Insufficient documentation

## 2013-10-19 DIAGNOSIS — Z792 Long term (current) use of antibiotics: Secondary | ICD-10-CM | POA: Insufficient documentation

## 2013-10-19 DIAGNOSIS — Z8659 Personal history of other mental and behavioral disorders: Secondary | ICD-10-CM | POA: Insufficient documentation

## 2013-10-19 DIAGNOSIS — Z87891 Personal history of nicotine dependence: Secondary | ICD-10-CM | POA: Insufficient documentation

## 2013-10-19 DIAGNOSIS — Z79899 Other long term (current) drug therapy: Secondary | ICD-10-CM | POA: Insufficient documentation

## 2013-10-19 DIAGNOSIS — O169 Unspecified maternal hypertension, unspecified trimester: Secondary | ICD-10-CM | POA: Insufficient documentation

## 2013-10-19 DIAGNOSIS — M542 Cervicalgia: Secondary | ICD-10-CM | POA: Insufficient documentation

## 2013-10-19 DIAGNOSIS — J069 Acute upper respiratory infection, unspecified: Secondary | ICD-10-CM | POA: Insufficient documentation

## 2013-10-19 DIAGNOSIS — Z8619 Personal history of other infectious and parasitic diseases: Secondary | ICD-10-CM | POA: Insufficient documentation

## 2013-10-19 LAB — CBC WITH DIFFERENTIAL/PLATELET
BASOS PCT: 0 % (ref 0–1)
Basophils Absolute: 0 10*3/uL (ref 0.0–0.1)
EOS ABS: 0.1 10*3/uL (ref 0.0–0.7)
EOS PCT: 1 % (ref 0–5)
HCT: 42.1 % (ref 36.0–46.0)
HEMOGLOBIN: 14.6 g/dL (ref 12.0–15.0)
LYMPHS ABS: 1.7 10*3/uL (ref 0.7–4.0)
Lymphocytes Relative: 15 % (ref 12–46)
MCH: 32.2 pg (ref 26.0–34.0)
MCHC: 34.7 g/dL (ref 30.0–36.0)
MCV: 92.7 fL (ref 78.0–100.0)
MONOS PCT: 12 % (ref 3–12)
Monocytes Absolute: 1.3 10*3/uL — ABNORMAL HIGH (ref 0.1–1.0)
Neutro Abs: 8.1 10*3/uL — ABNORMAL HIGH (ref 1.7–7.7)
Neutrophils Relative %: 72 % (ref 43–77)
Platelets: 340 10*3/uL (ref 150–400)
RBC: 4.54 MIL/uL (ref 3.87–5.11)
RDW: 12.9 % (ref 11.5–15.5)
WBC: 11.2 10*3/uL — ABNORMAL HIGH (ref 4.0–10.5)

## 2013-10-19 LAB — PREGNANCY, URINE: Preg Test, Ur: POSITIVE — AB

## 2013-10-19 LAB — RAPID STREP SCREEN (MED CTR MEBANE ONLY): STREPTOCOCCUS, GROUP A SCREEN (DIRECT): NEGATIVE

## 2013-10-19 LAB — URINE MICROSCOPIC-ADD ON

## 2013-10-19 LAB — URINALYSIS, ROUTINE W REFLEX MICROSCOPIC
BILIRUBIN URINE: NEGATIVE
Glucose, UA: NEGATIVE mg/dL
Ketones, ur: 15 mg/dL — AB
Leukocytes, UA: NEGATIVE
NITRITE: NEGATIVE
Protein, ur: NEGATIVE mg/dL
SPECIFIC GRAVITY, URINE: 1.025 (ref 1.005–1.030)
Urobilinogen, UA: 0.2 mg/dL (ref 0.0–1.0)
pH: 7 (ref 5.0–8.0)

## 2013-10-19 LAB — COMPREHENSIVE METABOLIC PANEL
ALBUMIN: 3.9 g/dL (ref 3.5–5.2)
ALT: 6 U/L (ref 0–35)
AST: 15 U/L (ref 0–37)
Alkaline Phosphatase: 80 U/L (ref 39–117)
BILIRUBIN TOTAL: 0.3 mg/dL (ref 0.3–1.2)
BUN: 11 mg/dL (ref 6–23)
CALCIUM: 9.8 mg/dL (ref 8.4–10.5)
CHLORIDE: 102 meq/L (ref 96–112)
CO2: 26 meq/L (ref 19–32)
CREATININE: 1 mg/dL (ref 0.50–1.10)
GFR calc Af Amer: 90 mL/min — ABNORMAL LOW (ref >90)
GFR, EST NON AFRICAN AMERICAN: 78 mL/min — AB (ref >90)
Glucose, Bld: 97 mg/dL (ref 70–99)
Potassium: 3.8 mEq/L (ref 3.7–5.3)
SODIUM: 141 meq/L (ref 137–147)
Total Protein: 7.6 g/dL (ref 6.0–8.3)

## 2013-10-19 MED ORDER — SODIUM CHLORIDE 0.9 % IV BOLUS (SEPSIS)
1000.0000 mL | Freq: Once | INTRAVENOUS | Status: AC
  Administered 2013-10-19: 1000 mL via INTRAVENOUS

## 2013-10-19 MED ORDER — KETOROLAC TROMETHAMINE 30 MG/ML IJ SOLN
30.0000 mg | Freq: Once | INTRAMUSCULAR | Status: AC
  Administered 2013-10-19: 30 mg via INTRAVENOUS
  Filled 2013-10-19: qty 1

## 2013-10-19 MED ORDER — OSELTAMIVIR PHOSPHATE 75 MG PO CAPS
75.0000 mg | ORAL_CAPSULE | Freq: Once | ORAL | Status: AC
  Administered 2013-10-19: 75 mg via ORAL
  Filled 2013-10-19: qty 1

## 2013-10-19 MED ORDER — DM-GUAIFENESIN ER 30-600 MG PO TB12: 1.0000 | ORAL_TABLET | Freq: Two times a day (BID) | ORAL | Status: DC | PRN

## 2013-10-19 MED ORDER — OSELTAMIVIR PHOSPHATE 75 MG PO CAPS: 75.0000 mg | ORAL_CAPSULE | Freq: Every day | ORAL | Status: DC

## 2013-10-19 MED ORDER — ACETAMINOPHEN 160 MG/5ML PO SOLN
ORAL | Status: DC
Start: 2013-10-19 — End: 2013-10-20
  Filled 2013-10-19: qty 20.3

## 2013-10-19 MED ORDER — ACETAMINOPHEN 325 MG PO TABS: 650.0000 mg | ORAL_TABLET | Freq: Once | ORAL | Status: DC

## 2013-10-19 MED ORDER — ACETAMINOPHEN 160 MG/5ML PO SOLN
650.0000 mg | Freq: Once | ORAL | Status: AC
  Administered 2013-10-19: 650 mg via ORAL

## 2013-10-19 NOTE — ED Provider Notes (Signed)
CSN: [id number]     Arrival date & time 10/19/13  1733 History   This chart was scribed for Carmin Muskrat, MD by Adriana Reams, ED Scribe. This patient was seen in room MH11/MH11 and the patient's care was started at [place].  First MD Initiated Contact with Patient 10/19/13 1940     Chief Complaint  Patient presents with  . Fever  . Generalized Body Aches  . Neck Pain     The history is provided by the patient. No language interpreter was used.   HPI Comments: Karina [name] is a 28 y.o. female with hx of HTN, who presents to the Emergency Department complaining of 3 days of gradual onset, gradually worsening rhinorrhea and HA with sore throat, body aches and chills that began today. She has tried Theraflu with no relief. She denies cough, fever (before arriving to the ED) or any other symptoms. She states she was diagnosed with shingles 3 weeks ago but she discontinued the medication she was prescribed (acylovir), but recently restarted it and finished it. She has been exposed to sick individuals. She denies tobacco use and endorses social alcohol use. She is not currently breastfeeding.    Past Medical History  Diagnosis Date  . Fibroids   . Trichomonas   . Pregnancy   . Depression     Postpartum Depression with 1st pregnancy  . History of chlamydia   . Hypertension     gestational  . Pregnancy induced hypertension    Past Surgical History  Procedure Laterality Date  . Dilation and curettage of uterus    . Cervical cerclage     Family History  Problem Relation Age of Onset  . Hypertension Mother   . Hypertension Father    History  Substance Use Topics  . Smoking status: Former Smoker    Quit date: 08/12/2012  . Smokeless tobacco: Never Used  . Alcohol Use: No   OB History   Grav Para Term Preterm Abortions TAB SAB Ect Mult Living   6 2 1 1 3  0 3 0 0 2     Review of Systems  Constitutional:       Per HPI, otherwise negative  HENT:       Per HPI, otherwise  negative  Respiratory:       Per HPI, otherwise negative  Cardiovascular:       Per HPI, otherwise negative  Gastrointestinal: Negative for vomiting.  Endocrine:       Negative aside from HPI  Genitourinary:       Neg aside from HPI   Musculoskeletal:       Per HPI, otherwise negative  Skin: Negative.   Neurological: Negative for syncope.    Allergies  Review of patient's allergies indicates no known allergies.  Home Medications   Current Outpatient Rx  Name  Route  Sig  Dispense  Refill  . acetaminophen (TYLENOL) 325 MG tablet   Oral   Take 650 mg by mouth daily as needed for pain.         Marland Kitchen acyclovir (ZOVIRAX) 400 MG tablet   Oral   Take 2 tablets (800 mg total) by mouth 5 (five) times daily.   70 tablet   0   . amoxicillin (AMOXIL) 500 MG capsule   Oral   Take 500 mg by mouth 2 (two) times daily.         Marland Kitchen BENAZEPRIL-HYDROCHLOROTHIAZIDE PO   Oral   Take by mouth.         Marland Kitchen  HYDROcodone-acetaminophen (NORCO/VICODIN) 5-325 MG per tablet   Oral   Take 1 tablet by mouth every 6 (six) hours as needed for pain.   8 tablet   0   . HYDROcodone-acetaminophen (NORCO/VICODIN) 5-325 MG per tablet   Oral   Take 1 tablet by mouth every 6 (six) hours as needed for moderate pain.   20 tablet   0   . ondansetron (ZOFRAN) 4 MG tablet   Oral   Take 1 tablet (4 mg total) by mouth every 6 (six) hours.   20 tablet   0    BP 154/99  Pulse 89  Temp(Src) 102.4 F (39.1 C) (Oral)  Resp 18  Ht 5\' 5"  (1.651 m)  Wt 217 lb (98.431 kg)  BMI 36.11 kg/m2  SpO2 100%  LMP 10/19/2013  Breastfeeding? Unknown  Physical Exam  Nursing note and vitals reviewed. Constitutional: She is oriented to person, place, and time. She appears well-developed and well-nourished. No distress.  HENT:  Head: Normocephalic and atraumatic.  Mouth/Throat: Uvula is midline.  Mild tonsillar erythema and edema. Palpable submental lymph nodes.   Eyes: Conjunctivae and EOM are normal.   Cardiovascular: Regular rhythm.  Tachycardia present.   Pulmonary/Chest: Effort normal and breath sounds normal. No stridor. No respiratory distress.  Abdominal: She exhibits no distension.  Musculoskeletal: She exhibits no edema.  Neurological: She is alert and oriented to person, place, and time. No cranial nerve deficit.  Skin: Skin is warm and dry.  Psychiatric: She has a normal mood and affect.    ED Course  Procedures (including critical care time) COORDINATION OF CARE: 8:06 PM Discussed treatment plan which includes CXR, IV fluids, pregnancy test, rapid strep screen, Tylenol, Toradol injection, comprehensive metabolic panel, CBC with differential, urinalysis, and strep culture with pt at bedside and pt agreed to plan.   9:05 PM Rechecked pt. Discussed results. Pt states that she had an abortion performed last week.    10:09 PM Patient appears better, states that she feels better.  Return precautions, follow up instructions discussed with the patient. In particular, she will have empiric Tamiflu started pending outstanding results.  Labs Review Labs Reviewed  COMPREHENSIVE METABOLIC PANEL - Abnormal; Notable for the following:    GFR calc non Af Amer 78 (*)    GFR calc Af Amer 90 (*)    All other components within normal limits  CBC WITH DIFFERENTIAL - Abnormal; Notable for the following:    WBC 11.2 (*)    Neutro Abs 8.1 (*)    Monocytes Absolute 1.3 (*)    All other components within normal limits  URINALYSIS, ROUTINE W REFLEX MICROSCOPIC - Abnormal; Notable for the following:    Hgb urine dipstick LARGE (*)    Ketones, ur 15 (*)    All other components within normal limits  PREGNANCY, URINE - Abnormal; Notable for the following:    Preg Test, Ur POSITIVE (*)    All other components within normal limits  URINE MICROSCOPIC-ADD ON - Abnormal; Notable for the following:    Crystals CA OXALATE CRYSTALS (*)    All other components within normal limits  RAPID STREP  SCREEN  CULTURE, GROUP A STREP  INFLUENZA PANEL BY PCR (TYPE A & B, H1N1)      Imaging Review Dg Chest 2 View  10/19/2013   CLINICAL DATA:  Fever, sore throat  EXAM: CHEST  2 VIEW  COMPARISON:  08/25/2010  FINDINGS: Cardiomediastinal silhouette is stable. No acute infiltrate or pleural effusion. No  pulmonary edema. Bony thorax is stable.  IMPRESSION: No active cardiopulmonary disease.   Electronically Signed   By: Lahoma Crocker M.D.   On: 10/19/2013 21:06      MDM    I personally performed the services described in this documentation, which was scribed in my presence. The recorded information has been reviewed and is accurate.   Patient presents with several days of generalized complaints, including fever, sinus drainage.  On exam patient is awake, alert, in no distress.  She is oriented x3, neurologically intact, with a soft, non-peritoneal abdomen.  Patient's labs are essentially notable for positive pregnancy test.  Subsequently the patient reveals that she had abortion performed in the past 2 weeks.  Patient has a soft abdomen, there is low concern for ongoing peritonitis. With her description of both skin and pain, and generalized discomfort with mild URI like symptoms, she was treated empirically for influenza, provided decongestants as well.  Carmin Muskrat, MD 10/19/13 2210

## 2013-10-19 NOTE — Discharge Instructions (Signed)
As discussed, it is important that you follow up as soon as possible with your physician for continued management of your condition.  You have outstanding lab work, and will be called if the results are abnormal.  If you develop any new, or concerning changes in your condition, please return to the emergency department immediately.

## 2013-10-19 NOTE — ED Notes (Signed)
Pt c/o fever, neck pain, sore throat, generalized body aches x 3 days.

## 2013-10-20 LAB — INFLUENZA PANEL BY PCR (TYPE A & B)
H1N1 flu by pcr: NOT DETECTED
INFLAPCR: NEGATIVE
Influenza B By PCR: NEGATIVE

## 2013-10-21 LAB — CULTURE, GROUP A STREP

## 2014-06-28 ENCOUNTER — Encounter (HOSPITAL_BASED_OUTPATIENT_CLINIC_OR_DEPARTMENT_OTHER): Payer: Self-pay | Admitting: Emergency Medicine

## 2014-08-27 ENCOUNTER — Inpatient Hospital Stay (HOSPITAL_COMMUNITY)
Admission: AD | Admit: 2014-08-27 | Discharge: 2014-08-27 | Disposition: A | Discharge status: Home / Self Care | Payer: Medicaid Other | Source: Ambulatory Visit | Attending: Obstetrics and Gynecology | Admitting: Obstetrics and Gynecology

## 2014-08-27 ENCOUNTER — Inpatient Hospital Stay (HOSPITAL_COMMUNITY): Payer: Medicaid Other

## 2014-08-27 DIAGNOSIS — R0602 Shortness of breath: Secondary | ICD-10-CM | POA: Diagnosis present

## 2014-08-27 DIAGNOSIS — I10 Essential (primary) hypertension: Secondary | ICD-10-CM | POA: Diagnosis not present

## 2014-08-27 DIAGNOSIS — A5901 Trichomonal vulvovaginitis: Secondary | ICD-10-CM

## 2014-08-27 DIAGNOSIS — R0789 Other chest pain: Secondary | ICD-10-CM | POA: Diagnosis not present

## 2014-08-27 DIAGNOSIS — Z87891 Personal history of nicotine dependence: Secondary | ICD-10-CM | POA: Insufficient documentation

## 2014-08-27 LAB — CBC
HCT: 38.7 % (ref 36.0–46.0)
HEMOGLOBIN: 14.1 g/dL (ref 12.0–15.0)
MCH: 33.1 pg (ref 26.0–34.0)
MCHC: 36.4 g/dL — AB (ref 30.0–36.0)
MCV: 90.8 fL (ref 78.0–100.0)
Platelets: 402 10*3/uL — ABNORMAL HIGH (ref 150–400)
RBC: 4.26 MIL/uL (ref 3.87–5.11)
RDW: 12.1 % (ref 11.5–15.5)
WBC: 6.7 10*3/uL (ref 4.0–10.5)

## 2014-08-27 LAB — WET PREP, GENITAL: Yeast Wet Prep HPF POC: NONE SEEN

## 2014-08-27 LAB — URINALYSIS, ROUTINE W REFLEX MICROSCOPIC
Bilirubin Urine: NEGATIVE
GLUCOSE, UA: NEGATIVE mg/dL
HGB URINE DIPSTICK: NEGATIVE
Ketones, ur: NEGATIVE mg/dL
Nitrite: NEGATIVE
PROTEIN: NEGATIVE mg/dL
Specific Gravity, Urine: 1.015 (ref 1.005–1.030)
Urobilinogen, UA: 0.2 mg/dL (ref 0.0–1.0)
pH: 7.5 (ref 5.0–8.0)

## 2014-08-27 LAB — URINE MICROSCOPIC-ADD ON

## 2014-08-27 LAB — POCT PREGNANCY, URINE: PREG TEST UR: NEGATIVE

## 2014-08-27 MED ORDER — METRONIDAZOLE 500 MG PO TABS
2000.0000 mg | ORAL_TABLET | Freq: Once | ORAL | Status: AC
  Administered 2014-08-27: 2000 mg via ORAL
  Filled 2014-08-27: qty 4

## 2014-08-27 MED ORDER — LABETALOL HCL 100 MG PO TABS
200.0000 mg | ORAL_TABLET | Freq: Once | ORAL | Status: AC
  Administered 2014-08-27: 200 mg via ORAL
  Filled 2014-08-27: qty 2

## 2014-08-27 MED ORDER — LISINOPRIL-HYDROCHLOROTHIAZIDE 20-25 MG PO TABS: 1.0000 | ORAL_TABLET | Freq: Every day | ORAL | Status: DC

## 2014-08-27 NOTE — Discharge Instructions (Signed)
Chest Wall Pain Chest wall pain is pain in or around the bones and muscles of your chest. It may take up to 6 weeks to get better. It may take longer if you must stay physically active in your work and activities.  CAUSES  Chest wall pain may happen on its own. However, it may be caused by:  A viral illness like the flu.  Injury.  Coughing.  Exercise.  Arthritis.  Fibromyalgia.  Shingles. HOME CARE INSTRUCTIONS   Avoid overtiring physical activity. Try not to strain or perform activities that cause pain. This includes any activities using your chest or your abdominal and side muscles, especially if heavy weights are used.  Put ice on the sore area.  Put ice in a plastic bag.  Place a towel between your skin and the bag.  Leave the ice on for 15-20 minutes per hour while awake for the first 2 days.  Only take over-the-counter or prescription medicines for pain, discomfort, or fever as directed by your caregiver. SEEK IMMEDIATE MEDICAL CARE IF:   Your pain increases, or you are very uncomfortable.  You have a fever.  Your chest pain becomes worse.  You have new, unexplained symptoms.  You have nausea or vomiting.  You feel sweaty or lightheaded.  You have a cough with phlegm (sputum), or you cough up blood. MAKE SURE YOU:   Understand these instructions.  Will watch your condition.  Will get help right away if you are not doing well or get worse. Document Released: 08/13/2005 Document Revised: 11/05/2011 Document Reviewed: 04/09/2011 [facility] Patient Information 2015 [place], [place]. This information is not intended to replace advice given to you by your health care provider. Make sure you discuss any questions you have with your health care provider. Trichomoniasis Trichomoniasis is an infection caused by an organism called Trichomonas. The infection can affect both women and men. In women, the outer female genitalia and the vagina are affected. In men, the  penis is mainly affected, but the prostate and other reproductive organs can also be involved. Trichomoniasis is a sexually transmitted infection (STI) and is most often passed to another person through sexual contact.  RISK FACTORS  Having unprotected sexual intercourse.  Having sexual intercourse with an infected partner. SIGNS AND SYMPTOMS  Symptoms of trichomoniasis in women include:  Abnormal gray-green frothy vaginal discharge.  Itching and irritation of the vagina.  Itching and irritation of the area outside the vagina. Symptoms of trichomoniasis in men include:   Penile discharge with or without pain.  Pain during urination. This results from inflammation of the urethra. DIAGNOSIS  Trichomoniasis may be found during a Pap test or physical exam. Your health care provider may use one of the following methods to help diagnose this infection:  Examining vaginal discharge under a microscope. For men, urethral discharge would be examined.  Testing the pH of the vagina with a test tape.  Using a vaginal swab test that checks for the Trichomonas organism. A test is available that provides results within a few minutes.  Doing a culture test for the organism. This is not usually needed. TREATMENT   You may be given medicine to fight the infection. Women should inform their health care provider if they could be or are pregnant. Some medicines used to treat the infection should not be taken during pregnancy.  Your health care provider may recommend over-the-counter medicines or creams to decrease itching or irritation.  Your sexual partner will need to be treated if  infected. HOME CARE INSTRUCTIONS   Take medicines only as directed by your health care provider.  Take over-the-counter medicine for itching or irritation as directed by your health care provider.  Do not have sexual intercourse while you have the infection.  Women should not douche or wear tampons while they have  the infection.  Discuss your infection with your partner. Your partner may have gotten the infection from you, or you may have gotten it from your partner.  Have your sex partner get examined and treated if necessary.  Practice safe, informed, and protected sex.  See your health care provider for other STI testing. SEEK MEDICAL CARE IF:   You still have symptoms after you finish your medicine.  You develop abdominal pain.  You have pain when you urinate.  You have bleeding after sexual intercourse.  You develop a rash.  Your medicine makes you sick or makes you throw up (vomit). MAKE SURE YOU:  Understand these instructions.  Will watch your condition.  Will get help right away if you are not doing well or get worse. Document Released: 02/06/2001 Document Revised: 12/28/2013 Document Reviewed: 05/25/2013 [facility] Patient Information 2015 [place], [place]. This information is not intended to replace advice given to you by your health care provider. Make sure you discuss any questions you have with your health care provider.  Hypertension Hypertension, commonly called high blood pressure, is when the force of blood pumping through your arteries is too strong. Your arteries are the blood vessels that carry blood from your heart throughout your body. A blood pressure reading consists of a higher number over a lower number, such as 110/72. The higher number (systolic) is the pressure inside your arteries when your heart pumps. The lower number (diastolic) is the pressure inside your arteries when your heart relaxes. Ideally you want your blood pressure below 120/80. Hypertension forces your heart to work harder to pump blood. Your arteries may become narrow or stiff. Having hypertension puts you at risk for heart disease, stroke, and other problems.  RISK FACTORS Some risk factors for high blood pressure are controllable. Others are not.  Risk factors you cannot control include:    Race. You may be at higher risk if you are African American.  Age. Risk increases with age.  Gender. Men are at higher risk than women before age 75 years. After age 60, women are at higher risk than men. Risk factors you can control include:  Not getting enough exercise or physical activity.  Being overweight.  Getting too much fat, sugar, calories, or salt in your diet.  Drinking too much alcohol. SIGNS AND SYMPTOMS Hypertension does not usually cause signs or symptoms. Extremely high blood pressure (hypertensive crisis) may cause headache, anxiety, shortness of breath, and nosebleed. DIAGNOSIS  To check if you have hypertension, your health care provider will measure your blood pressure while you are seated, with your arm held at the level of your heart. It should be measured at least twice using the same arm. Certain conditions can cause a difference in blood pressure between your right and left arms. A blood pressure reading that is higher than normal on one occasion does not mean that you need treatment. If one blood pressure reading is high, ask your health care provider about having it checked again. TREATMENT  Treating high blood pressure includes making lifestyle changes and possibly taking medicine. Living a healthy lifestyle can help lower high blood pressure. You may need to change some of your  habits. Lifestyle changes may include:  Following the DASH diet. This diet is high in fruits, vegetables, and whole grains. It is low in salt, red meat, and added sugars.  Getting at least 2 hours of brisk physical activity every week.  Losing weight if necessary.  Not smoking.  Limiting alcoholic beverages.  Learning ways to reduce stress. If lifestyle changes are not enough to get your blood pressure under control, your health care provider may prescribe medicine. You may need to take more than one. Work closely with your health care provider to understand the risks and  benefits. HOME CARE INSTRUCTIONS  Have your blood pressure rechecked as directed by your health care provider.   Take medicines only as directed by your health care provider. Follow the directions carefully. Blood pressure medicines must be taken as prescribed. The medicine does not work as well when you skip doses. Skipping doses also puts you at risk for problems.   Do not smoke.   Monitor your blood pressure at home as directed by your health care provider. SEEK MEDICAL CARE IF:   You think you are having a reaction to medicines taken.  You have recurrent headaches or feel dizzy.  You have swelling in your ankles.  You have trouble with your vision. SEEK IMMEDIATE MEDICAL CARE IF:  You develop a severe headache or confusion.  You have unusual weakness, numbness, or feel faint.  You have severe chest or abdominal pain.  You vomit repeatedly.  You have trouble breathing. MAKE SURE YOU:   Understand these instructions.  Will watch your condition.  Will get help right away if you are not doing well or get worse. Document Released: 08/13/2005 Document Revised: 12/28/2013 Document Reviewed: 06/05/2013 [facility] Patient Information 2015 [place], [place]. This information is not intended to replace advice given to you by your health care provider. Make sure you discuss any questions you have with your health care provider.

## 2014-08-27 NOTE — MAU Provider Note (Signed)
Chief Complaint: Shortness of Breath; Vaginal Discharge; and Chest Pain   First Provider Initiated Contact with Patient 08/27/14 1811     SUBJECTIVE HPI: Karina [name] is a 29 y.o. [id number] who presents to maternity admissions reporting pain with deep inspiration, shortness of breath because of her pain, and increased vaginal discharge with odor.  Painful breathing started today and vaginal discharge off and on x 1 month. She had URI 3 weeks ago, and her kids have had cough/runny nose this week. She has not had coughing for at least 1 week herself.  She denies vaginal bleeding, vaginal itching/burning, urinary symptoms, h/a, dizziness, n/v, or fever/chills.     Past Medical History  Diagnosis Date  . Fibroids   . Trichomonas   . Pregnancy   . Depression     Postpartum Depression with 1st pregnancy  . History of chlamydia   . Hypertension     gestational  . Pregnancy induced hypertension    Past Surgical History  Procedure Laterality Date  . Dilation and curettage of uterus    . Cervical cerclage     History   Social History  . Marital Status: Single    Spouse Name: N/A    Number of Children: N/A  . Years of Education: N/A   Occupational History  . Not on file.   Social History Main Topics  . Smoking status: Former Smoker    Quit date: 08/12/2012  . Smokeless tobacco: Never Used  . Alcohol Use: No  . Drug Use: No  . Sexual Activity: No     Comment: stopped tobacco with positive pregnancy test   Other Topics Concern  . Not on file   Social History Narrative   No current facility-administered medications on file prior to encounter.   Current Outpatient Prescriptions on File Prior to Encounter  Medication Sig Dispense Refill  . acetaminophen (TYLENOL) 325 MG tablet Take 650 mg by mouth daily as needed for pain.    Marland Kitchen acyclovir (ZOVIRAX) 400 MG tablet Take 2 tablets (800 mg total) by mouth 5 (five) times daily. (Patient not taking: Reported on 08/27/2014) 70 tablet 0   . dextromethorphan-guaiFENesin (MUCINEX DM) 30-600 MG per 12 hr tablet Take 1 tablet by mouth 2 (two) times daily as needed for cough. (Patient not taking: Reported on 08/27/2014) 20 tablet 0  . HYDROcodone-acetaminophen (NORCO/VICODIN) 5-325 MG per tablet Take 1 tablet by mouth every 6 (six) hours as needed for pain. 8 tablet 0  . HYDROcodone-acetaminophen (NORCO/VICODIN) 5-325 MG per tablet Take 1 tablet by mouth every 6 (six) hours as needed for moderate pain. (Patient not taking: Reported on 08/27/2014) 20 tablet 0  . ondansetron (ZOFRAN) 4 MG tablet Take 1 tablet (4 mg total) by mouth every 6 (six) hours. (Patient not taking: Reported on 08/27/2014) 20 tablet 0  . oseltamivir (TAMIFLU) 75 MG capsule Take 1 capsule (75 mg total) by mouth daily. (Patient not taking: Reported on 08/27/2014) 10 capsule 0   No Known Allergies  ROS: Pertinent items in HPI  OBJECTIVE Blood pressure 155/112, pulse 78, temperature 98.5 F (36.9 C), temperature source Oral, resp. rate 20, height 5\' 5"  (1.651 m), weight 101.515 kg (223 lb 12.8 oz), last menstrual period 08/16/2014, SpO2 100 %, not currently breastfeeding. GENERAL: Well-developed, well-nourished female in no acute distress.  HEENT: Normocephalic HEART: normal rate, heart sounds, regular rhythm RESP: normal effort, lung sounds clear and equal bilaterally CHEST: reproducible pain of right chest wall, right upper side ABDOMEN: Soft, non-tender  EXTREMITIES: Nontender, no edema NEURO: Alert and oriented Pelvic exam: Cervix pink, visually closed, without lesion, moderate amount frothy yellow discharge, vaginal walls and external genitalia normal Bimanual exam: Cervix 0/long/high, firm, anterior, neg CMT, uterus nontender, nonenlarged, adnexa without tenderness, enlargement, or mass  LAB RESULTS Results for orders placed or performed during the hospital encounter of 08/27/14 (from the past 24 hour(s))  Urinalysis, Routine w reflex microscopic     Status:  Abnormal   Collection Time: 08/27/14  5:30 PM  Result Value Ref Range   Color, Urine YELLOW YELLOW   APPearance CLOUDY (A) CLEAR   Specific Gravity, Urine 1.015 1.005 - 1.030   pH 7.5 5.0 - 8.0   Glucose, UA NEGATIVE NEGATIVE mg/dL   Hgb urine dipstick NEGATIVE NEGATIVE   Bilirubin Urine NEGATIVE NEGATIVE   Ketones, ur NEGATIVE NEGATIVE mg/dL   Protein, ur NEGATIVE NEGATIVE mg/dL   Urobilinogen, UA 0.2 0.0 - 1.0 mg/dL   Nitrite NEGATIVE NEGATIVE   Leukocytes, UA TRACE (A) NEGATIVE  Urine microscopic-add on     Status: None   Collection Time: 08/27/14  5:30 PM  Result Value Ref Range   Squamous Epithelial / LPF RARE RARE   WBC, UA 0-2 <3 WBC/hpf   RBC / HPF 0-2 <3 RBC/hpf   Bacteria, UA RARE RARE   Urine-Other AMORPHOUS URATES/PHOSPHATES   Pregnancy, urine POC     Status: None   Collection Time: 08/27/14  5:54 PM  Result Value Ref Range   Preg Test, Ur NEGATIVE NEGATIVE  Wet prep, genital     Status: Abnormal   Collection Time: 08/27/14  6:12 PM  Result Value Ref Range   Yeast Wet Prep HPF POC NONE SEEN NONE SEEN   Trich, Wet Prep FEW (A) NONE SEEN   Clue Cells Wet Prep HPF POC FEW (A) NONE SEEN   WBC, Wet Prep HPF POC FEW (A) NONE SEEN  CBC     Status: Abnormal   Collection Time: 08/27/14  6:19 PM  Result Value Ref Range   WBC 6.7 4.0 - 10.5 K/uL   RBC 4.26 3.87 - 5.11 MIL/uL   Hemoglobin 14.1 12.0 - 15.0 g/dL   HCT 38.7 36.0 - 46.0 %   MCV 90.8 78.0 - 100.0 fL   MCH 33.1 26.0 - 34.0 pg   MCHC 36.4 (H) 30.0 - 36.0 g/dL   RDW 12.1 11.5 - 15.5 %   Platelets 402 (H) 150 - 400 K/uL    IMAGING Dg Chest 2 View  08/27/2014   CLINICAL DATA:  Shortness of breath.  EXAM: CHEST  2 VIEW  COMPARISON:  October 19, 2013.  FINDINGS: The heart size and mediastinal contours are within normal limits. Both lungs are clear. No pneumothorax or pleural effusion is noted. The visualized skeletal structures are unremarkable.  IMPRESSION: No acute cardiopulmonary abnormality seen.    Electronically Signed   By: Sabino Dick M.D.   On: 08/27/2014 19:46     ASSESSMENT 1. Vaginal trichomoniasis   2. Shortness of breath   3. Right-sided chest wall pain   4. Benign essential hypertension     PLAN Consult Dr Glo Herring Labetalol 200 mg PO in MAU Flagyl 2 g PO in MAU.  Rx for partner written for treatment of trichomonas.  NKDA per pt.  Discharge home Restart HCTZ 25/Lisinopril 20 mg daily F/U with primary care.  Pt given info on finding primary care provider. Return to MAU or ED as needed for emergencies  Medication List    STOP taking these medications        acyclovir 400 MG tablet  Commonly known as:  ZOVIRAX     dextromethorphan-guaiFENesin 30-600 MG per 12 hr tablet  Commonly known as:  MUCINEX DM     HYDROcodone-acetaminophen 5-325 MG per tablet  Commonly known as:  NORCO/VICODIN     ondansetron 4 MG tablet  Commonly known as:  ZOFRAN     oseltamivir 75 MG capsule  Commonly known as:  TAMIFLU      TAKE these medications        acetaminophen 325 MG tablet  Commonly known as:  TYLENOL  Take 650 mg by mouth daily as needed for pain.     lisinopril-hydrochlorothiazide 20-25 MG per tablet  Commonly known as:  PRINZIDE,ZESTORETIC  Take 1 tablet by mouth daily.       Follow-up Information    Please follow up.   Why:  With primary care provider as soon as possible      Follow up with [facility].   Why:  As needed for emergencies   Contact information:   [address] [id number] [place] Maud McDonough Certified Nurse-Midwife 08/27/2014  8:06 PM

## 2014-08-27 NOTE — MAU Note (Signed)
Pt states she used to take two meds for Hypertension.   One being HCTZ, the can't remember the name of the other med.  Pt states her doctor took her off the medications about 3 months ago because her BP was under control.

## 2014-08-27 NOTE — MAU Note (Signed)
Patient states she has had pain for about 2 weeks in the upper left chest that radiates to the right mid area of her side with breathing and touch. Denies cough, sore throat or fever. Has a slight vaginal discharge. Has nausea off and on, not today, no vomiting or diarrhea.

## 2014-08-28 ENCOUNTER — Encounter (HOSPITAL_COMMUNITY): Payer: Self-pay | Admitting: Emergency Medicine

## 2014-08-28 ENCOUNTER — Emergency Department (HOSPITAL_COMMUNITY)
Admission: EM | Admit: 2014-08-28 | Discharge: 2014-08-28 | Disposition: A | Discharge status: Home / Self Care | Payer: Medicaid Other | Attending: Emergency Medicine | Admitting: Emergency Medicine

## 2014-08-28 ENCOUNTER — Emergency Department (HOSPITAL_COMMUNITY): Payer: Medicaid Other

## 2014-08-28 DIAGNOSIS — Z79899 Other long term (current) drug therapy: Secondary | ICD-10-CM | POA: Diagnosis not present

## 2014-08-28 DIAGNOSIS — R52 Pain, unspecified: Secondary | ICD-10-CM

## 2014-08-28 DIAGNOSIS — F329 Major depressive disorder, single episode, unspecified: Secondary | ICD-10-CM | POA: Diagnosis not present

## 2014-08-28 DIAGNOSIS — IMO0002 Reserved for concepts with insufficient information to code with codable children: Secondary | ICD-10-CM

## 2014-08-28 DIAGNOSIS — R1011 Right upper quadrant pain: Secondary | ICD-10-CM | POA: Diagnosis not present

## 2014-08-28 DIAGNOSIS — A7481 Chlamydial peritonitis: Secondary | ICD-10-CM | POA: Insufficient documentation

## 2014-08-28 DIAGNOSIS — Z87891 Personal history of nicotine dependence: Secondary | ICD-10-CM | POA: Diagnosis not present

## 2014-08-28 DIAGNOSIS — Z86018 Personal history of other benign neoplasm: Secondary | ICD-10-CM | POA: Insufficient documentation

## 2014-08-28 DIAGNOSIS — R109 Unspecified abdominal pain: Secondary | ICD-10-CM | POA: Diagnosis present

## 2014-08-28 LAB — COMPREHENSIVE METABOLIC PANEL
ALT: 9 U/L (ref 0–35)
ANION GAP: 9 (ref 5–15)
AST: 20 U/L (ref 0–37)
Albumin: 4.6 g/dL (ref 3.5–5.2)
Alkaline Phosphatase: 106 U/L (ref 39–117)
BUN: 11 mg/dL (ref 6–23)
CO2: 26 mmol/L (ref 19–32)
CREATININE: 0.86 mg/dL (ref 0.50–1.10)
Calcium: 9.8 mg/dL (ref 8.4–10.5)
Chloride: 102 mEq/L (ref 96–112)
GFR calc Af Amer: >90 mL/min (ref >90)
GFR calc non Af Amer: >90 mL/min (ref >90)
Glucose, Bld: 89 mg/dL (ref 70–99)
POTASSIUM: 3.7 mmol/L (ref 3.5–5.1)
Sodium: 137 mmol/L (ref 135–145)
TOTAL PROTEIN: 8.8 g/dL — AB (ref 6.0–8.3)
Total Bilirubin: 0.6 mg/dL (ref 0.3–1.2)

## 2014-08-28 LAB — CBC WITH DIFFERENTIAL/PLATELET
Basophils Absolute: 0 10*3/uL (ref 0.0–0.1)
Basophils Relative: 0 % (ref 0–1)
Eosinophils Absolute: 0.1 10*3/uL (ref 0.0–0.7)
Eosinophils Relative: 1 % (ref 0–5)
HCT: 45.1 % (ref 36.0–46.0)
Hemoglobin: 15.3 g/dL — ABNORMAL HIGH (ref 12.0–15.0)
LYMPHS ABS: 1.5 10*3/uL (ref 0.7–4.0)
Lymphocytes Relative: 20 % (ref 12–46)
MCH: 31.4 pg (ref 26.0–34.0)
MCHC: 33.9 g/dL (ref 30.0–36.0)
MCV: 92.4 fL (ref 78.0–100.0)
MONOS PCT: 9 % (ref 3–12)
Monocytes Absolute: 0.7 10*3/uL (ref 0.1–1.0)
NEUTROS ABS: 5.4 10*3/uL (ref 1.7–7.7)
NEUTROS PCT: 70 % (ref 43–77)
PLATELETS: 484 10*3/uL — AB (ref 150–400)
RBC: 4.88 MIL/uL (ref 3.87–5.11)
RDW: 12.2 % (ref 11.5–15.5)
WBC: 7.7 10*3/uL (ref 4.0–10.5)

## 2014-08-28 LAB — LIPASE, BLOOD: LIPASE: 30 U/L (ref 11–59)

## 2014-08-28 MED ORDER — DOXYCYCLINE HYCLATE 100 MG PO CAPS: 100.0000 mg | ORAL_CAPSULE | Freq: Two times a day (BID) | ORAL | Status: DC

## 2014-08-28 MED ORDER — IBUPROFEN 800 MG PO TABS
800.0000 mg | ORAL_TABLET | Freq: Once | ORAL | Status: DC
  Filled 2014-08-28: qty 1

## 2014-08-28 MED ORDER — CEFTRIAXONE SODIUM 250 MG IJ SOLR
250.0000 mg | Freq: Once | INTRAMUSCULAR | Status: AC
  Administered 2014-08-28: 250 mg via INTRAMUSCULAR
  Filled 2014-08-28: qty 250

## 2014-08-28 MED ORDER — TRAMADOL HCL 50 MG PO TABS: 50.0000 mg | ORAL_TABLET | Freq: Four times a day (QID) | ORAL | Status: DC | PRN

## 2014-08-28 MED ORDER — LIDOCAINE HCL 1 % IJ SOLN
INTRAMUSCULAR | Status: AC
  Administered 2014-08-28: 0.9 mL
  Filled 2014-08-28: qty 20

## 2014-08-28 MED ORDER — IBUPROFEN 800 MG PO TABS: 800.0000 mg | ORAL_TABLET | Freq: Three times a day (TID) | ORAL | Status: DC

## 2014-08-28 MED ORDER — METRONIDAZOLE 500 MG PO TABS: 500.0000 mg | ORAL_TABLET | Freq: Two times a day (BID) | ORAL | Status: DC

## 2014-08-28 MED ORDER — TRAMADOL HCL 50 MG PO TABS
100.0000 mg | ORAL_TABLET | Freq: Once | ORAL | Status: AC
  Administered 2014-08-28: 100 mg via ORAL
  Filled 2014-08-28: qty 2

## 2014-08-28 NOTE — Discharge Instructions (Signed)
Abdominal Pain, Women °Abdominal (stomach, pelvic, or belly) pain can be caused by many things. It is important to tell your doctor: °· The location of the pain. °· Does it come and go or is it present all the time? °· Are there things that start the pain (eating certain foods, exercise)? °· Are there other symptoms associated with the pain (fever, nausea, vomiting, diarrhea)? °All of this is helpful to know when trying to find the cause of the pain. °CAUSES  °· Stomach: virus or bacteria infection, or ulcer. °· Intestine: appendicitis (inflamed appendix), regional ileitis (Crohn's disease), ulcerative colitis (inflamed colon), irritable bowel syndrome, diverticulitis (inflamed diverticulum of the colon), or cancer of the stomach or intestine. °· Gallbladder disease or stones in the gallbladder. °· Kidney disease, kidney stones, or infection. °· Pancreas infection or cancer. °· Fibromyalgia (pain disorder). °· Diseases of the female organs: °· Uterus: fibroid (non-cancerous) tumors or infection. °· Fallopian tubes: infection or tubal pregnancy. °· Ovary: cysts or tumors. °· Pelvic adhesions (scar tissue). °· Endometriosis (uterus lining tissue growing in the pelvis and on the pelvic organs). °· Pelvic congestion syndrome (female organs filling up with blood just before the menstrual period). °· Pain with the menstrual period. °· Pain with ovulation (producing an egg). °· Pain with an IUD (intrauterine device, birth control) in the uterus. °· Cancer of the female organs. °· Functional pain (pain not caused by a disease, may improve without treatment). °· Psychological pain. °· Depression. °DIAGNOSIS  °Your doctor will decide the seriousness of your pain by doing an examination. °· Blood tests. °· X-rays. °· Ultrasound. °· CT scan (computed tomography, special type of X-ray). °· MRI (magnetic resonance imaging). °· Cultures, for infection. °· Barium enema (dye inserted in the large intestine, to better view it with  X-rays). °· Colonoscopy (looking in intestine with a lighted tube). °· Laparoscopy (minor surgery, looking in abdomen with a lighted tube). °· Major abdominal exploratory surgery (looking in abdomen with a large incision). °TREATMENT  °The treatment will depend on the cause of the pain.  °· Many cases can be observed and treated at home. °· Over-the-counter medicines recommended by your caregiver. °· Prescription medicine. °· Antibiotics, for infection. °· Birth control pills, for painful periods or for ovulation pain. °· Hormone treatment, for endometriosis. °· Nerve blocking injections. °· Physical therapy. °· Antidepressants. °· Counseling with a psychologist or psychiatrist. °· Minor or major surgery. °HOME CARE INSTRUCTIONS  °· Do not take laxatives, unless directed by your caregiver. °· Take over-the-counter pain medicine only if ordered by your caregiver. Do not take aspirin because it can cause an upset stomach or bleeding. °· Try a clear liquid diet (broth or water) as ordered by your caregiver. Slowly move to a bland diet, as tolerated, if the pain is related to the stomach or intestine. °· Have a thermometer and take your temperature several times a day, and record it. °· Bed rest and sleep, if it helps the pain. °· Avoid sexual intercourse, if it causes pain. °· Avoid stressful situations. °· Keep your follow-up appointments and tests, as your caregiver orders. °· If the pain does not go away with medicine or surgery, you may try: °· Acupuncture. °· Relaxation exercises (yoga, meditation). °· Group therapy. °· Counseling. °SEEK MEDICAL CARE IF:  °· You notice certain foods cause stomach pain. °· Your home care treatment is not helping your pain. °· You need stronger pain medicine. °· You want your IUD removed. °· You feel faint or   lightheaded.  You develop nausea and vomiting.  You develop a rash.  You are having side effects or an allergy to your medicine. SEEK IMMEDIATE MEDICAL CARE IF:   Your  pain does not go away or gets worse.  You have a fever.  Your pain is felt only in portions of the abdomen. The right side could possibly be appendicitis. The left lower portion of the abdomen could be colitis or diverticulitis.  You are passing blood in your stools (bright red or black tarry stools, with or without vomiting).  You have blood in your urine.  You develop chills, with or without a fever.  You pass out. MAKE SURE YOU:   Understand these instructions.  Will watch your condition.  Will get help right away if you are not doing well or get worse. Document Released: 06/10/2007 Document Revised: 12/28/2013 Document Reviewed: 06/30/2009 [facility] Patient Information 2015 [place], [place]. This information is not intended to replace advice given to you by your health care provider. Make sure you discuss any questions you have with your health care provider. Pelvic Inflammatory Disease Pelvic inflammatory disease (PID) refers to an infection in some or all of the female organs. The infection can be in the uterus, ovaries, fallopian tubes, or the surrounding tissues in the pelvis. PID can cause abdominal or pelvic pain that comes on suddenly (acute pelvic pain). PID is a serious infection because it can lead to lasting (chronic) pelvic pain or the inability to have children (infertile).  CAUSES  The infection is often caused by the normal bacteria found in the vaginal tissues. PID may also be caused by an infection that is spread during sexual contact. PID can also occur following:   The birth of a baby.   A miscarriage.   An abortion.   Major pelvic surgery.   The use of an intrauterine device (IUD).   A sexual assault.  RISK FACTORS Certain factors can put a person at higher risk for PID, such as:  Being younger than 25 years.  Being sexually active at [place] age.  Usingnonbarrier contraception.  Havingmultiple sexual partners.  Having sex with someone  who has symptoms of a genital infection.  Using oral contraception. Other times, certain behaviors can increase the possibility of getting PID, such as:  Having sex during your period.  Using a vaginal douche.  Having an intrauterine device (IUD) in place. SYMPTOMS   Abdominal or pelvic pain.   Fever.   Chills.   Abnormal vaginal discharge.  Abnormal uterine bleeding.   Unusual pain shortly after finishing your period. DIAGNOSIS  Your caregiver will choose some of the following methods to make a diagnosis, such as:   Performinga physical exam and history. A pelvic exam typically reveals a very tender uterus and surrounding pelvis.   Ordering laboratory tests including a pregnancy test, blood tests, and urine test.  Orderingcultures of the vagina and cervix to check for a sexually transmitted infection (STI).  Performing an ultrasound.   Performing a laparoscopic procedure to look inside the pelvis.  TREATMENT   Antibiotic medicines may be prescribed and taken by mouth.   Sexual partners may be treated when the infection is caused by a sexually transmitted disease (STD).   Hospitalization may be needed to give antibiotics intravenously.  Surgery may be needed, but this is rare. It may take weeks until you are completely well. If you are diagnosed with PID, you should also be checked for human immunodeficiency virus (HIV). HOME CARE  INSTRUCTIONS   If given, take your antibiotics as directed. Finish the medicine even if you start to feel better.   Only take over-the-counter or prescription medicines for pain, discomfort, or fever as directed by your caregiver.   Do not have sexual intercourse until treatment is completed or as directed by your caregiver. If PID is confirmed, your recent sexual partner(s) will need treatment.   Keep your follow-up appointments. SEEK MEDICAL CARE IF:   You have increased or abnormal vaginal discharge.   You need  prescription medicine for your pain.   You vomit.   You cannot take your medicines.   Your partner has an STD.  SEEK IMMEDIATE MEDICAL CARE IF:   You have a fever.   You have increased abdominal or pelvic pain.   You have chills.   You have pain when you urinate.   You are not better after 72 hours following treatment.  MAKE SURE YOU:   Understand these instructions.  Will watch your condition.  Will get help right away if you are not doing well or get worse. Document Released: 08/13/2005 Document Revised: 12/08/2012 Document Reviewed: 08/09/2011 [facility] Patient Information 2015 [place], [place]. This information is not intended to replace advice given to you by your health care provider. Make sure you discuss any questions you have with your health care provider.  Emergency Department Resource Guide 1) Find a Doctor and Pay Out of Pocket Although you won't have to find out who is covered by your insurance plan, it is a good idea to ask around and get recommendations. You will then need to call the office and see if the doctor you have chosen will accept you as a new patient and what types of options they offer for patients who are self-pay. Some doctors offer discounts or will set up payment plans for their patients who do not have insurance, but you will need to ask so you aren't surprised when you get to your appointment.  2) Contact Your Local Health Department Not all health departments have doctors that can see patients for sick visits, but many do, so it is worth a call to see if yours does. If you don't know where your local health department is, you can check in your phone book. The CDC also has a tool to help you locate your state's health department, and many state websites also have listings of all of their local health departments.  3) Find a [facility] If your illness is not likely to be very severe or complicated, you may want to try a walk in clinic.  These are popping up all over the country in pharmacies, drugstores, and shopping centers. They're usually staffed by nurse practitioners or physician assistants that have been trained to treat common illnesses and complaints. They're usually fairly quick and inexpensive. However, if you have serious medical issues or chronic medical problems, these are probably not your best option.  No Primary Care Doctor: - Call Health Connect at  [phone] - they can help you locate a primary care doctor that  accepts your insurance, provides certain services, etc. - Physician Referral Service- [phone]  Chronic Pain Problems: Organization         Address  Phone   Notes  [facility]  [phone] Patients need to be referred by their primary care doctor.   Medication Assistance: Organization         Address  Phone   Notes  [facility] Medication  Assistance Program [address] [place], [place] [address] [phone] --Must be a resident of [facility] -- Must have NO insurance coverage whatsoever (no Medicaid/ Medicare, etc.) -- The pt. MUST have a primary care doctor that directs their care regularly and follows them in the community   MedAssist  [phone]   [employer]  [phone]    Agencies that provide inexpensive medical care: Organization         Address  Phone   Notes  [place]  [phone]   [facility]    [phone]   [facility] [place], [place] [address] [phone]   [place] [address], [place] [phone]   Planned Parenthood    [phone]   [facility]    [phone]   [facility] and [address], [place] Phone:  [phone], Fax:  [phone] Hours of Operation:  9 am - 6 pm, M-F.  Also accepts Medicaid/Medicare and self-pay.  [facility] for  [place], [address], [place] Phone: [phone], Fax: [phone]. Hours of Operation:  8:30 am - 5:30 pm, M-F.  Also accepts Medicaid and self-pay.  [facility] [place] [address], [place] Phone: [phone]   [place], [place], [place] [phone], Ext. [phone] Mondays & Thursdays: 7-9 AM.  First 15 patients are seen on a first come, first serve basis.    [place] Providers:  Organization         Address  Phone   Notes  [facility] [place] [address], [place] [phone] Also accepts self-pay patients.  [facility] [address], [place]  [phone]   [place], [address], [place] [phone]   [facility] [address] [place] [phone]   Lucianne Lei [address], [place]   [phone] Only accepts [place] Access [place] patients after they have their name applied to their card.   Self-Pay (no insurance) in [facility]:  Organization         Address  Phone   Notes  Sickle Cell Patients, [facility] [place] [phone]   [facility] [place] [phone]   [facility] [place]  [place], [place], [place] [phone]   [facility]/Dr. Osei-Bonsu  [address] Maria Stein or [address], [place] [phone] Phone number for both [place] and [place] locations is the same.  Urgent Medical and [facility] [address], [place] [phone]   [facility] [address], [place] or [address] [phone]   [facility] [address], [place] [phone], phone; [phone], fax Sees patients 1st  and 3rd Saturday of every month.  Must not qualify for public or private insurance (i.e. Medicaid, Medicare, [place] Health Choice, Veterans' Benefits)  Household income should be no more than 200% of the poverty level The clinic cannot treat you if you are pregnant or think you are pregnant  Sexually transmitted diseases are not treated at  the clinic.    Dental Care: Organization         Address  Phone  Notes  [facility] Department of [facility] [place] [phone] Accepts children up to age 76 who are enrolled in [place] or [place]; pregnant women with a Medicaid card; and children who have applied for Medicaid or [place] Health Choice, but were declined, whose parents can pay a reduced fee at time of service.  [facility] Department of [facility]  [address], [place] [phone] Accepts children up to age 73 who are enrolled in [place] or [place]; pregnant women with a Medicaid card; and children who have applied for Medicaid or [place] Health Choice, but were declined, whose parents can pay a reduced fee at time of service.  [place] Adult Dental Access PROGRAM  [place] [phone] Patients are seen by appointment only. Walk-ins are not accepted. [place] will see patients 96 years of age and older. Monday - Tuesday (8am-5pm) Most Wednesdays (8:30-5pm) $30 per visit, cash only  [facility] Adult Dental Access PROGRAM  [address], [facility] [phone] Patients are seen by appointment only. Walk-ins are not accepted. [place] will see patients 11 years of age and older. One Wednesday Evening (Monthly: Volunteer Based).  $30 per visit, cash only  [place]  [phone] for adults; Children under age 50, call Graduate Pediatric Dentistry at [phone]. Children aged 15-14, please call [phone] to request a pediatric  application.  Dental services are provided in all areas of dental care including fillings, crowns and bridges, complete and partial dentures, implants, gum treatment, root canals, and extractions. Preventive care is also provided. Treatment is provided to both adults and children. Patients are selected via a lottery and there is often a waiting list.   [facility] [address], [place]  [phone] [email or web address]   [facility] [address] [place], [place] [phone], Ext. [phone] Second and Fourth Thursday of each month, opens at 6:30 AM; Clinic ends at 9 AM.  Patients are seen on a first-come first-served basis, and a limited number are seen during each clinic.   [facility]  [address] Hillard Danker [place], [place] [phone]   Eligibility Requirements You must have lived in [place], [place], or [place] counties for at least the last three months.   You cannot be eligible for state or federal sponsored [employer], including [employer], [place], or [employer].   You generally cannot be eligible for healthcare insurance through your employer.    How to apply: Eligibility screenings are held every Tuesday and Wednesday afternoon from 1:00 pm until 4:00 pm. You do not need an appointment for the interview!  [facility] [address], [place], [place]   [place]  [place] Department  [place]  [phone]    Behavioral Health Resources in the Community: Intensive Outpatient Programs Organization         Address  Phone  Notes  [place] [facility]. [address], [place], [place] [phone]   [facility] Outpatient [address] [place], [place]   ADS: Alcohol & Drug Svcs [address] [place], [place]   [place]  [address],  [place], [place] or [phone]   Substance Abuse Resources Organization         Address  Phone  Notes  Alcohol and Drug Services  [phone]   [place]  [phone]   [facility]   Chinita Pester  [phone]   Residential & Outpatient Substance Abuse Program  [phone]   Psychological Services Organization         Address  Phone  Notes  [facility]  [place]  [phone]   [place] [address] [place] or [phone]    Mobile Crisis Teams Organization         Address  Phone  Notes  [facility], Mobile Crisis Care Unit  [phone]   [facility]  [address] [place], [place]   [place] [address], [place] [phone]    Self-Help/Support Groups Organization         Address  Phone             Notes  [place]. of [place] - variety of support groups  [place] Call for more information  Narcotics Anonymous (NA), [facility] [address], [employer]   2 meetings at this location   Special educational needs teacher         Address  Phone  Notes  [facility]AP Residential Treatment [place],    [place]  [phone]   [facility]  [address] [place] [address], [place], [place]   [place], [place] [phone] Admissions: 8am-3pm M-F  Incentives Substance [place] [address],    [place], [place] [phone]   [facility] [address] [place], [place], [place]   [facility] [address],  [place], [place]   [facility] - Intensive Outpatient [address] Kristeen [address], [place], [place]   [facility] [place].,    [place], [place] [phone] or [phone]   [facility] Services (RTS) [address] [place], [place] Accepts Medicaid  [facility] [place] [address],  [place] [phone] Substance Abuse/Addiction Treatment   [facility] Organization         Address  Phone  Notes  [facility]  [phone]   Domenic Schwab, PhD [address] Arlis Porta [place], [place]   [phone] or [phone]   [place] [facility] [place], [place] [phone]   [facility] [address], [place], [place] [phone] Insurance/Medicaid/sponsorship through [facility] and Families [address] [place]                                    [place], [place] [phone] [place] [address], [place] [phone]    Dr. Adele Schilder  [phone]   [facility] [place] Dept. 1) [address] [place] 2) [place] 3)  [place] [address], [place] [phone]  [phone]   [place] [phone] or [phone] (After Hours)

## 2014-08-28 NOTE — ED Provider Notes (Signed)
CSN: [id number]     Arrival date & time 08/28/14  1834 History   First MD Initiated Contact with Patient 08/28/14 1950     Chief Complaint  Patient presents with  . Abdominal Pain     (Consider location/radiation/quality/duration/timing/severity/associated sxs/prior Treatment) HPI The patient prevents presents with right upper quadrant pain. This has been going on for 2 weeks duration. She reports that it hurts when she moves. She reports taking a deep breath hurts. She has not had any cough or shortness of breath in association with this. She was the pain is just beneath her right rib and slightly in her abdomen. She is not having any nausea or vomiting. She has been having some lower abdominal pain. She reports it is cramping as suprapubic in nature. She does have a prior history of Trichomonas diagnosed yesterday. He does not have any lower extremity swelling or pain. Past Medical History  Diagnosis Date  . Fibroids   . Trichomonas   . Pregnancy   . Depression     Postpartum Depression with 1st pregnancy  . History of chlamydia   . Hypertension     gestational  . Pregnancy induced hypertension    Past Surgical History  Procedure Laterality Date  . Dilation and curettage of uterus    . Cervical cerclage     Family History  Problem Relation Age of Onset  . Hypertension Mother   . Hypertension Father    History  Substance Use Topics  . Smoking status: Former Smoker    Quit date: 08/12/2012  . Smokeless tobacco: Never Used  . Alcohol Use: No   OB History    Gravida Para Term Preterm AB TAB SAB Ectopic Multiple Living   6 2 1 1 3  0 3 0 0 2     Review of Systems  10 Systems reviewed and are negative for acute change except as noted in the HPI.   Allergies  Review of patient's allergies indicates no known allergies.  Home Medications   Prior to Admission medications   Medication Sig Start Date End Date Taking? Authorizing Provider  acetaminophen (TYLENOL) 325 MG  tablet Take 650 mg by mouth daily as needed for pain.   Yes Historical Provider, MD  HYDROcodone-acetaminophen (NORCO/VICODIN) 5-325 MG per tablet Take 1 tablet by mouth every 6 (six) hours as needed for moderate pain.   Yes Historical Provider, MD  doxycycline (VIBRAMYCIN) 100 MG capsule Take 1 capsule (100 mg total) by mouth 2 (two) times daily. One po bid x 7 days 08/28/14   Charlesetta Shanks, MD  ibuprofen (ADVIL,MOTRIN) 800 MG tablet Take 1 tablet (800 mg total) by mouth 3 (three) times daily. 08/28/14   Charlesetta Shanks, MD  lisinopril-hydrochlorothiazide (PRINZIDE,ZESTORETIC) 20-25 MG per tablet Take 1 tablet by mouth daily. Patient not taking: Reported on 08/28/2014 08/27/14   Lattie Haw A Leftwich-Kirby, CNM  metroNIDAZOLE (FLAGYL) 500 MG tablet Take 1 tablet (500 mg total) by mouth 2 (two) times daily. 08/28/14   Charlesetta Shanks, MD  traMADol (ULTRAM) 50 MG tablet Take 1 tablet (50 mg total) by mouth every 6 (six) hours as needed. 08/28/14   Charlesetta Shanks, MD   BP 170/89 mmHg  Pulse 80  Temp(Src) 98.8 F (37.1 C) (Oral)  Resp 19  SpO2 100%  LMP 08/16/2014 Physical Exam  Constitutional: She is oriented to person, place, and time. She appears well-developed and well-nourished.  The patient is well appearance. She is sitting up and holding her right side. She is  rocking back and forth in complaining of pain in it. She has no respiratory distress or color is good and she is otherwise well appearance.  HENT:  Head: Normocephalic and atraumatic.  Eyes: EOM are normal. Pupils are equal, round, and reactive to light.  Neck: Neck supple.  Cardiovascular: Normal rate, regular rhythm, normal heart sounds and intact distal pulses.   Pulmonary/Chest: Effort normal and breath sounds normal. She exhibits no tenderness.  Abdominal: Soft. Bowel sounds are normal. She exhibits no distension and no mass. There is tenderness. There is no rebound and no guarding.  Patient is reproducibly tender in the right upper quadrant.  Palpation of the hepatic margin is very uncomfortable for the patient and reproduces her pain. Patient also endorses significant discomfort to palpation of the suprapubic and adnexal region. There is no guarding or mass present.  Musculoskeletal: Normal range of motion. She exhibits no edema or tenderness.  Neurological: She is alert and oriented to person, place, and time. She has normal strength. Coordination normal. GCS eye subscore is 4. GCS verbal subscore is 5. GCS motor subscore is 6.  Skin: Skin is warm, dry and intact.  Psychiatric: She has a normal mood and affect.    ED Course  Procedures (including critical care time) Labs Review Labs Reviewed  COMPREHENSIVE METABOLIC PANEL - Abnormal; Notable for the following:    Total Protein 8.8 (*)    All other components within normal limits  CBC WITH DIFFERENTIAL - Abnormal; Notable for the following:    Hemoglobin 15.3 (*)    Platelets 484 (*)    All other components within normal limits  LIPASE, BLOOD    Imaging Review US Abdomen Limited  08/28/2014   CLINICAL DATA:  Right upper quadrant abdomen pain  EXAM: US ABDOMEN LIMITED - RIGHT UPPER QUADRANT  COMPARISON:  None.  FINDINGS: Gallbladder:  No gallstones or wall thickening visualized. No sonographic Murphy sign noted.  Common bile duct:  Diameter: 2.7 mm  Liver:  No focal lesion identified. Within normal limits in parenchymal echogenicity.  IMPRESSION: Normal gallbladder.   Electronically Signed   By: Abelardo Diesel M.D.   On: 08/28/2014 21:32     EKG Interpretation None      MDM   Final diagnoses:  Pain  Right upper quadrant pain  Fitzhugh-Curtis syndrome   The patient's presentation pain was very suggestive of biliary colic or hepatic origin. At this point right upper quadrant ultrasound is normal and LFTs are normal. The patient has tested positive for Trichomonas. Pelvic examination was done yesterday at [facility]. At this time GC and chlamydia are not returned. The patient  does have moderate suprapubic tenderness to palpation. With patient's duration of symptoms and right upper quadrant pain I did consider possible PID with Fitzhugh Curtis syndrome. The patient has no respiratory distress her oxygenation is at 100% she has had an negative chest x-ray. Her lower extremities are soft and nontender. At this time there is no indication this would be secondary to PE. I especially find this to be the case given the degree of reproducibility with palpation within the abdomen. The patient is advised to follow-up for reassessment and will be treated for pain and suspected infectious source.     Charlesetta Shanks, MD 08/29/14 2208

## 2014-08-28 NOTE — ED Notes (Addendum)
Pt from home c/o right sided abdominal pain x 2 weeks. Denies diarrhea but reports occasional nausea. She reports it hurts taking deep breaths. [facility] in [facility]

## 2014-08-28 NOTE — ED Notes (Signed)
MD at bedside. 

## 2014-08-29 LAB — HIV ANTIBODY (ROUTINE TESTING W REFLEX): HIV 1&2 Ab, 4th Generation: NONREACTIVE

## 2014-09-01 LAB — GC/CHLAMYDIA PROBE AMP
CT Probe RNA: POSITIVE — AB
GC PROBE AMP APTIMA: NEGATIVE

## 2014-09-04 ENCOUNTER — Inpatient Hospital Stay (HOSPITAL_COMMUNITY): Payer: Medicaid Other

## 2014-09-04 ENCOUNTER — Inpatient Hospital Stay (HOSPITAL_COMMUNITY)
Admission: AD | Admit: 2014-09-04 | Discharge: 2014-09-04 | Disposition: A | Discharge status: Home / Self Care | Payer: Medicaid Other | Source: Ambulatory Visit | Attending: Obstetrics and Gynecology | Admitting: Obstetrics and Gynecology

## 2014-09-04 ENCOUNTER — Encounter (HOSPITAL_COMMUNITY): Payer: Self-pay | Admitting: *Deleted

## 2014-09-04 DIAGNOSIS — A5602 Chlamydial vulvovaginitis: Secondary | ICD-10-CM | POA: Diagnosis not present

## 2014-09-04 DIAGNOSIS — Z87891 Personal history of nicotine dependence: Secondary | ICD-10-CM | POA: Insufficient documentation

## 2014-09-04 DIAGNOSIS — R109 Unspecified abdominal pain: Secondary | ICD-10-CM | POA: Diagnosis not present

## 2014-09-04 DIAGNOSIS — N949 Unspecified condition associated with female genital organs and menstrual cycle: Secondary | ICD-10-CM | POA: Diagnosis present

## 2014-09-04 DIAGNOSIS — I10 Essential (primary) hypertension: Secondary | ICD-10-CM | POA: Insufficient documentation

## 2014-09-04 DIAGNOSIS — A5901 Trichomonal vulvovaginitis: Secondary | ICD-10-CM | POA: Diagnosis not present

## 2014-09-04 DIAGNOSIS — R102 Pelvic and perineal pain: Secondary | ICD-10-CM

## 2014-09-04 DIAGNOSIS — A749 Chlamydial infection, unspecified: Secondary | ICD-10-CM

## 2014-09-04 LAB — URINE MICROSCOPIC-ADD ON

## 2014-09-04 LAB — URINALYSIS, ROUTINE W REFLEX MICROSCOPIC
BILIRUBIN URINE: NEGATIVE
GLUCOSE, UA: NEGATIVE mg/dL
KETONES UR: NEGATIVE mg/dL
Nitrite: NEGATIVE
PH: 5.5 (ref 5.0–8.0)
PROTEIN: NEGATIVE mg/dL
Specific Gravity, Urine: 1.025 (ref 1.005–1.030)
Urobilinogen, UA: 0.2 mg/dL (ref 0.0–1.0)

## 2014-09-04 LAB — POCT PREGNANCY, URINE: Preg Test, Ur: NEGATIVE

## 2014-09-04 MED ORDER — KETOROLAC TROMETHAMINE 60 MG/2ML IM SOLN: 60.0000 mg | Freq: Once | INTRAMUSCULAR | Status: DC

## 2014-09-04 MED ORDER — LABETALOL HCL 100 MG PO TABS
200.0000 mg | ORAL_TABLET | Freq: Once | ORAL | Status: AC
  Administered 2014-09-04: 200 mg via ORAL
  Filled 2014-09-04: qty 2

## 2014-09-04 NOTE — [facility] Provider Note (Signed)
History     CSN: [id number]  Arrival date and time: 09/04/14 0255   First Provider Initiated Contact with Patient 09/04/14 0403      Chief Complaint  Patient presents with  . Abdominal Pain   HPI   Ms.Karina [name] is a 29 y.o. female [phone] who presents with recurring pelvic pain. She was seen in [facility] a week ago and was diagnosed with trichomonas. She was treated with flagyl. She did not feel any better the following day so she went to [facility]. She was then told that her chlamydia swab was positive and she was treated for PID. She has taken several antibiotics over the past week and is now experiencing pelvic pain and nausea. She has continued to take the doxycycline.   Patient has a history of chronic HTN and has not been taking her HCTZ due to her stomach pain.   OB History    Gravida Para Term Preterm AB TAB SAB Ectopic Multiple Living   6 2 1 1 3  0 3 0 0 2      Past Medical History  Diagnosis Date  . Fibroids   . Trichomonas   . Pregnancy   . Depression     Postpartum Depression with 1st pregnancy  . History of chlamydia   . Hypertension     gestational  . Pregnancy induced hypertension     Past Surgical History  Procedure Laterality Date  . Dilation and curettage of uterus    . Cervical cerclage      Family History  Problem Relation Age of Onset  . Hypertension Mother   . Hypertension Father     History  Substance Use Topics  . Smoking status: Former Smoker    Quit date: 08/12/2012  . Smokeless tobacco: Never Used  . Alcohol Use: No    Allergies: No Known Allergies  Prescriptions prior to admission  Medication Sig Dispense Refill Last Dose  . acetaminophen (TYLENOL) 325 MG tablet Take 650 mg by mouth daily as needed for pain.   08/27/2014 at Unknown time  . doxycycline (VIBRAMYCIN) 100 MG capsule Take 1 capsule (100 mg total) by mouth 2 (two) times daily. One po bid x 7 days 28 capsule 0   . HYDROcodone-acetaminophen (NORCO/VICODIN) 5-325 MG  per tablet Take 1 tablet by mouth every 6 (six) hours as needed for moderate pain.   08/28/2014 at Unknown time  . ibuprofen (ADVIL,MOTRIN) 800 MG tablet Take 1 tablet (800 mg total) by mouth 3 (three) times daily. 21 tablet 0   . lisinopril-hydrochlorothiazide (PRINZIDE,ZESTORETIC) 20-25 MG per tablet Take 1 tablet by mouth daily. (Patient not taking: Reported on 08/28/2014) 30 tablet 5   . metroNIDAZOLE (FLAGYL) 500 MG tablet Take 1 tablet (500 mg total) by mouth 2 (two) times daily. 28 tablet 0   . traMADol (ULTRAM) 50 MG tablet Take 1 tablet (50 mg total) by mouth every 6 (six) hours as needed. 20 tablet 0    Results for orders placed or performed during the hospital encounter of 09/04/14 (from the past 48 hour(s))  Urinalysis, Routine w reflex microscopic     Status: Abnormal   Collection Time: 09/04/14  3:20 AM  Result Value Ref Range   Color, Urine YELLOW YELLOW   APPearance CLEAR CLEAR   Specific Gravity, Urine 1.025 1.005 - 1.030   pH 5.5 5.0 - 8.0   Glucose, UA NEGATIVE NEGATIVE mg/dL   Hgb urine dipstick LARGE (A) NEGATIVE   Bilirubin Urine NEGATIVE  NEGATIVE   Ketones, ur NEGATIVE NEGATIVE mg/dL   Protein, ur NEGATIVE NEGATIVE mg/dL   Urobilinogen, UA 0.2 0.0 - 1.0 mg/dL   Nitrite NEGATIVE NEGATIVE   Leukocytes, UA SMALL (A) NEGATIVE  Urine microscopic-add on     Status: Abnormal   Collection Time: 09/04/14  3:20 AM  Result Value Ref Range   Squamous Epithelial / LPF RARE RARE   WBC, UA 3-6 <3 WBC/hpf   RBC / HPF 21-50 <3 RBC/hpf   Bacteria, UA FEW (A) RARE  Pregnancy, urine POC     Status: None   Collection Time: 09/04/14  3:41 AM  Result Value Ref Range   Preg Test, Ur NEGATIVE NEGATIVE    Comment:        THE SENSITIVITY OF THIS METHODOLOGY IS >24 mIU/mL    Dg Chest 2 View  08/27/2014   CLINICAL DATA:  Shortness of breath.  EXAM: CHEST  2 VIEW  COMPARISON:  October 19, 2013.  FINDINGS: The heart size and mediastinal contours are within normal limits. Both lungs are  clear. No pneumothorax or pleural effusion is noted. The visualized skeletal structures are unremarkable.  IMPRESSION: No acute cardiopulmonary abnormality seen.   Electronically Signed   By: Sabino Dick M.D.   On: 08/27/2014 19:46   US Transvaginal Non-ob  09/04/2014   CLINICAL DATA:  Pelvic pain for 3 weeks. History of fibroids. Chlamydia infection. Painful urination.  EXAM: TRANSABDOMINAL AND TRANSVAGINAL ULTRASOUND OF PELVIS  TECHNIQUE: Both transabdominal and transvaginal ultrasound examinations of the pelvis were performed. Transabdominal technique was performed for global imaging of the pelvis including uterus, ovaries, adnexal regions, and pelvic cul-de-sac. It was necessary to proceed with endovaginal exam following the transabdominal exam to visualize the uterus and ovaries.  COMPARISON:  09/10/2013  FINDINGS: Uterus  Measurements: 8.7 x 4.9 x 6.2 cm, anteverted. No fibroids or other mass visualized.  Endometrium  Thickness: 12 mm.  No focal abnormality visualized.  Right ovary  Measurements: 4.2 x 3.1 x 2.8 cm. Normal appearance/no adnexal mass.  Left ovary  Measurements: 4.1 x 3.8 x 3.1 cm. Normal appearance/no adnexal mass.  Other findings  No free fluid.  IMPRESSION: Normal ultrasound appearance of the uterus and ovaries.   Electronically Signed   By: Lucienne Capers M.D.   On: 09/04/2014 04:57   US Pelvis Complete  09/04/2014   CLINICAL DATA:  Pelvic pain for 3 weeks. History of fibroids. Chlamydia infection. Painful urination.  EXAM: TRANSABDOMINAL AND TRANSVAGINAL ULTRASOUND OF PELVIS  TECHNIQUE: Both transabdominal and transvaginal ultrasound examinations of the pelvis were performed. Transabdominal technique was performed for global imaging of the pelvis including uterus, ovaries, adnexal regions, and pelvic cul-de-sac. It was necessary to proceed with endovaginal exam following the transabdominal exam to visualize the uterus and ovaries.  COMPARISON:  09/10/2013  FINDINGS: Uterus   Measurements: 8.7 x 4.9 x 6.2 cm, anteverted. No fibroids or other mass visualized.  Endometrium  Thickness: 12 mm.  No focal abnormality visualized.  Right ovary  Measurements: 4.2 x 3.1 x 2.8 cm. Normal appearance/no adnexal mass.  Left ovary  Measurements: 4.1 x 3.8 x 3.1 cm. Normal appearance/no adnexal mass.  Other findings  No free fluid.  IMPRESSION: Normal ultrasound appearance of the uterus and ovaries.   Electronically Signed   By: Lucienne Capers M.D.   On: 09/04/2014 04:57   US Abdomen Limited  08/28/2014   CLINICAL DATA:  Right upper quadrant abdomen pain  EXAM: US ABDOMEN LIMITED - RIGHT UPPER  QUADRANT  COMPARISON:  None.  FINDINGS: Gallbladder:  No gallstones or wall thickening visualized. No sonographic Murphy sign noted.  Common bile duct:  Diameter: 2.7 mm  Liver:  No focal lesion identified. Within normal limits in parenchymal echogenicity.  IMPRESSION: Normal gallbladder.   Electronically Signed   By: Abelardo Diesel M.D.   On: 08/28/2014 21:32    Review of Systems  Constitutional: Positive for chills. Negative for fever.  Eyes: Negative for blurred vision.  Respiratory: Negative for shortness of breath.   Cardiovascular: Negative for chest pain.  Gastrointestinal: Positive for nausea and abdominal pain (lower, middle stomach pain ). Negative for vomiting.  Genitourinary: Negative for dysuria, urgency, frequency and hematuria.       + vaginal pressure  Neurological: Negative for headaches.   Physical Exam   Blood pressure 142/90, pulse 94, temperature 98.6 F (37 C), resp. rate 20, height 5\' 5"  (1.651 m), weight 100.789 kg (222 lb 3.2 oz), last menstrual period 08/16/2014, SpO2 100 %, not currently breastfeeding.  Physical Exam  Constitutional: She is oriented to person, place, and time. She appears well-developed and well-nourished. No distress.  HENT:  Head: Normocephalic.  Eyes: Pupils are equal, round, and reactive to light.  Neck: Neck supple.  Cardiovascular: Normal  rate and normal heart sounds.   Respiratory: Effort normal and breath sounds normal. No respiratory distress.  GI: Soft. Normal appearance. There is tenderness in the suprapubic area. There is no rigidity, no rebound, no guarding and no CVA tenderness.  Musculoskeletal: Normal range of motion.  Neurological: She is alert and oriented to person, place, and time. She has normal strength. No sensory deficit. GCS eye subscore is 4. GCS verbal subscore is 5. GCS motor subscore is 6.  Skin: Skin is warm. She is not diaphoretic.  Psychiatric: Her behavior is normal.    [facility] Course  Procedures  None  MDM UA Urine culture Pelvic US; normal  Negative fevers Labetalol 200 mg Po  Assessment and Plan   A: Abdominal pain likely related to chlamydia/trichomonas/ PID and multiple antibiotics   P: Discharge home in stable condition  Stop flagyl  Continue doxycycline Urine culture pending  Discussed at length the importance of the patient taking her BP medication. Probiotics as directed If any chest pain, SOB, HA patient is instructed to go to [facility] or [facility] ED   Darrelyn Hillock Azan Maneri, NP 09/04/2014 5:16 AM

## 2014-09-04 NOTE — Progress Notes (Signed)
Noni Saupe NP in earlier to discuss test result and d/c plan. Written and verbal d/c instructions given and understanding voiced

## 2014-09-04 NOTE — MAU Note (Signed)
Was seen here 08/27/14 and told had trich. Then got call and told had Chlamydia. Taking doxycycline and continues to have lower abd pain. Took Pepto Bismal thinking maybe had a lot of gas. Has had several BMs today but on urinated twice. Is hard to urinate and feel like I have to go but don't.

## 2014-09-05 LAB — URINE CULTURE: SPECIAL REQUESTS: NORMAL

## 2014-10-23 ENCOUNTER — Emergency Department (HOSPITAL_COMMUNITY): Payer: Medicaid Other

## 2014-10-23 ENCOUNTER — Encounter (HOSPITAL_COMMUNITY): Payer: Self-pay | Admitting: Emergency Medicine

## 2014-10-23 ENCOUNTER — Emergency Department (HOSPITAL_COMMUNITY)
Admission: EM | Admit: 2014-10-23 | Discharge: 2014-10-23 | Disposition: A | Discharge status: Home / Self Care | Payer: Medicaid Other | Attending: Emergency Medicine | Admitting: Emergency Medicine

## 2014-10-23 DIAGNOSIS — Z8659 Personal history of other mental and behavioral disorders: Secondary | ICD-10-CM | POA: Insufficient documentation

## 2014-10-23 DIAGNOSIS — R1032 Left lower quadrant pain: Secondary | ICD-10-CM | POA: Diagnosis present

## 2014-10-23 DIAGNOSIS — Z87891 Personal history of nicotine dependence: Secondary | ICD-10-CM | POA: Diagnosis not present

## 2014-10-23 DIAGNOSIS — N730 Acute parametritis and pelvic cellulitis: Secondary | ICD-10-CM | POA: Diagnosis not present

## 2014-10-23 DIAGNOSIS — Z8619 Personal history of other infectious and parasitic diseases: Secondary | ICD-10-CM | POA: Insufficient documentation

## 2014-10-23 DIAGNOSIS — Z3202 Encounter for pregnancy test, result negative: Secondary | ICD-10-CM | POA: Insufficient documentation

## 2014-10-23 DIAGNOSIS — Z79899 Other long term (current) drug therapy: Secondary | ICD-10-CM | POA: Insufficient documentation

## 2014-10-23 DIAGNOSIS — R102 Pelvic and perineal pain: Secondary | ICD-10-CM

## 2014-10-23 DIAGNOSIS — Z792 Long term (current) use of antibiotics: Secondary | ICD-10-CM | POA: Diagnosis not present

## 2014-10-23 LAB — COMPREHENSIVE METABOLIC PANEL
ALBUMIN: 3.9 g/dL (ref 3.5–5.2)
ALT: 9 U/L (ref 0–35)
ANION GAP: 4 — AB (ref 5–15)
AST: 14 U/L (ref 0–37)
Alkaline Phosphatase: 75 U/L (ref 39–117)
BILIRUBIN TOTAL: 0.3 mg/dL (ref 0.3–1.2)
BUN: 11 mg/dL (ref 6–23)
CHLORIDE: 107 mmol/L (ref 96–112)
CO2: 27 mmol/L (ref 19–32)
Calcium: 9 mg/dL (ref 8.4–10.5)
Creatinine, Ser: 0.89 mg/dL (ref 0.50–1.10)
GFR, EST NON AFRICAN AMERICAN: 89 mL/min — AB (ref >90)
Glucose, Bld: 104 mg/dL — ABNORMAL HIGH (ref 70–99)
Potassium: 3.8 mmol/L (ref 3.5–5.1)
Sodium: 138 mmol/L (ref 135–145)
Total Protein: 7.2 g/dL (ref 6.0–8.3)

## 2014-10-23 LAB — LIPASE, BLOOD: Lipase: 33 U/L (ref 11–59)

## 2014-10-23 LAB — URINALYSIS, ROUTINE W REFLEX MICROSCOPIC
Bilirubin Urine: NEGATIVE
Glucose, UA: NEGATIVE mg/dL
Hgb urine dipstick: NEGATIVE
KETONES UR: NEGATIVE mg/dL
Nitrite: NEGATIVE
Protein, ur: NEGATIVE mg/dL
SPECIFIC GRAVITY, URINE: 1.019 (ref 1.005–1.030)
UROBILINOGEN UA: 0.2 mg/dL (ref 0.0–1.0)
pH: 7.5 (ref 5.0–8.0)

## 2014-10-23 LAB — CBC WITH DIFFERENTIAL/PLATELET
Basophils Absolute: 0 10*3/uL (ref 0.0–0.1)
Basophils Relative: 0 % (ref 0–1)
EOS ABS: 0.1 10*3/uL (ref 0.0–0.7)
Eosinophils Relative: 1 % (ref 0–5)
HCT: 41.1 % (ref 36.0–46.0)
Hemoglobin: 14 g/dL (ref 12.0–15.0)
Lymphocytes Relative: 21 % (ref 12–46)
Lymphs Abs: 1.3 10*3/uL (ref 0.7–4.0)
MCH: 31 pg (ref 26.0–34.0)
MCHC: 34.1 g/dL (ref 30.0–36.0)
MCV: 91.1 fL (ref 78.0–100.0)
MONO ABS: 0.4 10*3/uL (ref 0.1–1.0)
Monocytes Relative: 7 % (ref 3–12)
NEUTROS PCT: 71 % (ref 43–77)
Neutro Abs: 4.3 10*3/uL (ref 1.7–7.7)
Platelets: 411 10*3/uL — ABNORMAL HIGH (ref 150–400)
RBC: 4.51 MIL/uL (ref 3.87–5.11)
RDW: 12.7 % (ref 11.5–15.5)
WBC: 6 10*3/uL (ref 4.0–10.5)

## 2014-10-23 LAB — WET PREP, GENITAL: Yeast Wet Prep HPF POC: NONE SEEN

## 2014-10-23 LAB — URINE MICROSCOPIC-ADD ON

## 2014-10-23 LAB — POC URINE PREG, ED: Preg Test, Ur: NEGATIVE

## 2014-10-23 MED ORDER — ONDANSETRON HCL 4 MG PO TABS: 4.0000 mg | ORAL_TABLET | Freq: Four times a day (QID) | ORAL | Status: DC

## 2014-10-23 MED ORDER — METRONIDAZOLE 500 MG PO TABS
2000.0000 mg | ORAL_TABLET | Freq: Once | ORAL | Status: AC
  Administered 2014-10-23: 2000 mg via ORAL
  Filled 2014-10-23: qty 4

## 2014-10-23 MED ORDER — CEFTRIAXONE SODIUM 250 MG IJ SOLR
250.0000 mg | Freq: Once | INTRAMUSCULAR | Status: AC
  Administered 2014-10-23: 250 mg via INTRAMUSCULAR
  Filled 2014-10-23: qty 250

## 2014-10-23 MED ORDER — DOXYCYCLINE HYCLATE 100 MG PO TABS
100.0000 mg | ORAL_TABLET | Freq: Once | ORAL | Status: AC
  Administered 2014-10-23: 100 mg via ORAL
  Filled 2014-10-23: qty 1

## 2014-10-23 MED ORDER — LIDOCAINE HCL (PF) 1 % IJ SOLN
INTRAMUSCULAR | Status: AC
Start: 2014-10-23 — End: 2014-10-23
  Administered 2014-10-23: 5 mL
  Filled 2014-10-23: qty 5

## 2014-10-23 MED ORDER — DOXYCYCLINE HYCLATE 100 MG PO CAPS: 100.0000 mg | ORAL_CAPSULE | Freq: Two times a day (BID) | ORAL | Status: DC

## 2014-10-23 MED ORDER — AZITHROMYCIN 250 MG PO TABS
500.0000 mg | ORAL_TABLET | Freq: Once | ORAL | Status: AC
  Administered 2014-10-23: 500 mg via ORAL
  Filled 2014-10-23: qty 2

## 2014-10-23 NOTE — ED Notes (Signed)
Pt c/o NV and lt sided abd pain since this morning.

## 2014-10-23 NOTE — ED Notes (Signed)
ED PA at bedside

## 2014-10-23 NOTE — ED Notes (Signed)
US at bedside

## 2014-10-23 NOTE — ED Provider Notes (Signed)
CSN: [id number]     Arrival date & time 10/23/14  1452 History   First MD Initiated Contact with Patient 10/23/14 1641     Chief Complaint  Patient presents with  . Abdominal Pain    HPI   85 YOF with recent history of STI present with left lower abdominal pain. Pt states that this morning she felt her usual self until noon when she started to experience left lower abdominal fulness and pain. She describes it as consistent, "pressure " like, worse with movement or palpation, no radiation of symptoms. Shortly after the pain started she felt nauseous and vomitted x 2; no blood noted. She notes the pain reached a peak a few hours in and has slowly been getting better. Her last oral intake was around noon.   On Jan 1st she was diagnosed with chlamydia and trichomoniasis; she took doxycycline and metronidazole for a few days but discontinued therapy once her symptoms improved.  She reports some "tingling" in the suprapubic area when she urinates buts denies frequency, discharge, blood, or clarity of urine; denies back pain.   Pt denies fever, chills, headache, chest, or respiratory complaints. Last menstrual cycle was one week prior and was normal. D&C and cervical cerclage only reported procedures.   Also notes a history of HTN but no longer takes medication reporting "she does not like pills"   Past Medical History  Diagnosis Date  . Fibroids   . Trichomonas   . Pregnancy   . Depression     Postpartum Depression with 1st pregnancy  . History of chlamydia   . Hypertension     gestational  . Pregnancy induced hypertension    Past Surgical History  Procedure Laterality Date  . Dilation and curettage of uterus    . Cervical cerclage     Family History  Problem Relation Age of Onset  . Hypertension Mother   . Hypertension Father    History  Substance Use Topics  . Smoking status: Former Smoker    Quit date: 08/12/2012  . Smokeless tobacco: Never Used  . Alcohol Use: No   OB  History    Gravida Para Term Preterm AB TAB SAB Ectopic Multiple Living   6 2 1 1 3  0 3 0 0 2     Review of Systems  All other systems reviewed and are negative.   Allergies  Review of patient's allergies indicates no known allergies.  Home Medications   Prior to Admission medications   Medication Sig Start Date End Date Taking? Authorizing Provider  lisinopril-hydrochlorothiazide (PRINZIDE,ZESTORETIC) 20-25 MG per tablet Take 1 tablet by mouth daily. 08/27/14  Yes Lisa A Leftwich-Kirby, CNM  doxycycline (VIBRAMYCIN) 100 MG capsule Take 1 capsule (100 mg total) by mouth 2 (two) times daily. One po bid x 7 days Patient not taking: Reported on 10/23/2014 08/28/14   Charlesetta Shanks, MD  HYDROcodone-acetaminophen (NORCO/VICODIN) 5-325 MG per tablet Take 1 tablet by mouth every 6 (six) hours as needed for moderate pain.    Historical Provider, MD  ibuprofen (ADVIL,MOTRIN) 800 MG tablet Take 1 tablet (800 mg total) by mouth 3 (three) times daily. Patient not taking: Reported on 10/23/2014 08/28/14   Charlesetta Shanks, MD  traMADol (ULTRAM) 50 MG tablet Take 1 tablet (50 mg total) by mouth every 6 (six) hours as needed. Patient not taking: Reported on 10/23/2014 08/28/14   Charlesetta Shanks, MD   BP 179/122 mmHg  Pulse 71  Temp(Src) 98.6 F (37 C) (Oral)  Resp  20  SpO2 100% Physical Exam  Constitutional: She is oriented to person, place, and time. She appears well-developed and well-nourished.  HENT:  Head: Normocephalic and atraumatic.  Eyes: Pupils are equal, round, and reactive to light.  Neck: Normal range of motion. Neck supple. No JVD present. No tracheal deviation present. No thyromegaly present.  Cardiovascular: Normal rate, regular rhythm, normal heart sounds and intact distal pulses.  Exam reveals no gallop and no friction rub.   No murmur heard. Pulmonary/Chest: Effort normal and breath sounds normal. No stridor. No respiratory distress. She has no wheezes. She has no rales. She exhibits no  tenderness.  Abdominal: Soft. Bowel sounds are normal. She exhibits no distension and no mass. There is no rebound and no guarding.  Pain with deep palpation of left lower quadrant, no mass noted  Genitourinary: Uterus normal.  Moderate amount of white discharge found in vaginal vault; no blood. Bi-manual resulted in cervical motion tenderness.   Musculoskeletal: Normal range of motion.  Lymphadenopathy:    She has no cervical adenopathy.  Neurological: She is alert and oriented to person, place, and time. Coordination normal.  Skin: Skin is warm and dry.  Psychiatric: She has a normal mood and affect. Her behavior is normal. Judgment and thought content normal.  Nursing note and vitals reviewed.   ED Course  Procedures (including critical care time) Labs Review Labs Reviewed  CBC WITH DIFFERENTIAL/PLATELET - Abnormal; Notable for the following:    Platelets 411 (*)    All other components within normal limits  COMPREHENSIVE METABOLIC PANEL - Abnormal; Notable for the following:    Glucose, Bld 104 (*)    GFR calc non Af Amer 89 (*)    Anion gap 4 (*)    All other components within normal limits  LIPASE, BLOOD  URINALYSIS, ROUTINE W REFLEX MICROSCOPIC  RPR  HIV ANTIBODY (ROUTINE TESTING)  POC URINE PREG, ED  GC/CHLAMYDIA PROBE AMP ([place])    Imaging Review US Transvaginal Non-ob  10/23/2014   CLINICAL DATA:  Pelvic pain.  EXAM: TRANSABDOMINAL AND TRANSVAGINAL ULTRASOUND OF PELVIS  DOPPLER ULTRASOUND OF OVARIES  TECHNIQUE: Both transabdominal and transvaginal ultrasound examinations of the pelvis were performed. Transabdominal technique was performed for global imaging of the pelvis including uterus, ovaries, adnexal regions, and pelvic cul-de-sac.  It was necessary to proceed with endovaginal exam following the transabdominal exam to visualize the ovaries. Color and duplex Doppler ultrasound was utilized to evaluate blood flow to the ovaries.  COMPARISON:  Multiple OB gyn  ultrasounds  FINDINGS: Uterus  Measurements: Normal in size and homogeneous echotexture measuring 8.8 x 5.7 x 6.7 cm. No fibroids or other mass visualized.  Endometrium  Thickness: Normal in thickness for premenopausal female at 8.3 mm. No focal abnormality visualized.  Right ovary  Measurements: Normal in size of small follicles measuring 3.6 x 2.2 x 2.8 cm. Normal appearance/no adnexal mass.  Left ovary  Measurements: Normal size a small follicles measuring 3.4 x 2.1 x 3.1 cm per. Normal appearance/no adnexal mass.  Pulsed Doppler evaluation of both ovaries demonstrates normal low-resistance arterial and venous waveforms.  Other findings  No free fluid. Adjacent to the left ovary there is an anechoic tubular structure which measures upwards a 6 cm x 3 cm. No internal echoes.  IMPRESSION: 1. Normal uterus and endometrium. 2. Normal ovaries with normal color Doppler and spectral flow. 3. A tubular fluid collection adjacent to the left adnexa. 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 Less likely a peritoneal inclusion cyst.  Nonemergent MRI could differentiate.   Electronically Signed   By: Suzy Bouchard M.D.   On: 10/23/2014 20:55   US Pelvis Complete  10/23/2014   CLINICAL DATA:  Pelvic pain.  EXAM: TRANSABDOMINAL AND TRANSVAGINAL ULTRASOUND OF PELVIS  DOPPLER ULTRASOUND OF OVARIES  TECHNIQUE: Both transabdominal and transvaginal ultrasound examinations of the pelvis were performed. Transabdominal technique was performed for global imaging of the pelvis including uterus, ovaries, adnexal regions, and pelvic cul-de-sac.  It was necessary to proceed with endovaginal exam following the transabdominal exam to visualize the ovaries. Color and duplex Doppler ultrasound was utilized to evaluate blood flow to the ovaries.  COMPARISON:  Multiple OB gyn ultrasounds  FINDINGS: Uterus  Measurements: Normal in size and homogeneous echotexture measuring 8.8 x 5.7 x 6.7 cm. No fibroids or other mass visualized.  Endometrium   Thickness: Normal in thickness for premenopausal female at 8.3 mm. No focal abnormality visualized.  Right ovary  Measurements: Normal in size of small follicles measuring 3.6 x 2.2 x 2.8 cm. Normal appearance/no adnexal mass.  Left ovary  Measurements: Normal size a small follicles measuring 3.4 x 2.1 x 3.1 cm per. Normal appearance/no adnexal mass.  Pulsed Doppler evaluation of both ovaries demonstrates normal low-resistance arterial and venous waveforms.  Other findings  No free fluid. Adjacent to the left ovary there is an anechoic tubular structure which measures upwards a 6 cm x 3 cm. No internal echoes.  IMPRESSION: 1. Normal uterus and endometrium. 2. Normal ovaries with normal color Doppler and spectral flow. 3. A tubular fluid collection adjacent to the left adnexa. 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 Less likely a peritoneal inclusion cyst. Nonemergent MRI could differentiate.   Electronically Signed   By: Suzy Bouchard M.D.   On: 10/23/2014 20:55   [place] Art/ven Flow Abd Pelv Doppler  10/23/2014   CLINICAL DATA:  Pelvic pain.  EXAM: TRANSABDOMINAL AND TRANSVAGINAL ULTRASOUND OF PELVIS  DOPPLER ULTRASOUND OF OVARIES  TECHNIQUE: Both transabdominal and transvaginal ultrasound examinations of the pelvis were performed. Transabdominal technique was performed for global imaging of the pelvis including uterus, ovaries, adnexal regions, and pelvic cul-de-sac.  It was necessary to proceed with endovaginal exam following the transabdominal exam to visualize the ovaries. Color and duplex Doppler ultrasound was utilized to evaluate blood flow to the ovaries.  COMPARISON:  Multiple OB gyn ultrasounds  FINDINGS: Uterus  Measurements: Normal in size and homogeneous echotexture measuring 8.8 x 5.7 x 6.7 cm. No fibroids or other mass visualized.  Endometrium  Thickness: Normal in thickness for premenopausal female at 8.3 mm. No focal abnormality visualized.  Right ovary  Measurements: Normal in size of small  follicles measuring 3.6 x 2.2 x 2.8 cm. Normal appearance/no adnexal mass.  Left ovary  Measurements: Normal size a small follicles measuring 3.4 x 2.1 x 3.1 cm per. Normal appearance/no adnexal mass.  Pulsed Doppler evaluation of both ovaries demonstrates normal low-resistance arterial and venous waveforms.  Other findings  No free fluid. Adjacent to the left ovary there is an anechoic tubular structure which measures upwards a 6 cm x 3 cm. No internal echoes.  IMPRESSION: 1. Normal uterus and endometrium. 2. Normal ovaries with normal color Doppler and spectral flow. 3. A tubular fluid collection adjacent to the left adnexa. 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 Less likely a peritoneal inclusion cyst. Nonemergent MRI could differentiate.   Electronically Signed   By: Suzy Bouchard M.D.   On: 10/23/2014 20:55     EKG Interpretation None  MDM   Final diagnoses:  PID (acute pelvic inflammatory disease)   Pt's past medical history and exam findings highly suggest the diagnosis of PID. Wet prep positive for Trich with WBC's. Urine negative for infection, CBC normal, CMP normal, and lipase normal. US pelvis with normal findings with the exception of possible hydrosalpinx. Pt was able to eat crackers and drink juice throughout her stay but did had one bout of N/V later into her stay. She was treated with azithromycin, doxycyline, metronidazole, and Rocephin. Discharged with Doxycycline and Zofran. Pt was offered admittance for nausea control but denied stating she has children at home that cannot be left alone overnight. A detailed discussion including the importance of proper medical management, follow-up, and signs of worsening infection. She understand the potential and severe complications if she did not follow our recommendations. HTN management will be resumed per patient. She was given contact information for Women's health follow-up.    Stevie Kern Taeya Theall, PA-C 10/23/14 [place], MD 10/25/14 [phone]

## 2014-10-24 ENCOUNTER — Encounter (HOSPITAL_COMMUNITY): Payer: Self-pay | Admitting: Emergency Medicine

## 2014-10-24 ENCOUNTER — Inpatient Hospital Stay (HOSPITAL_COMMUNITY)
Admission: AD | Admit: 2014-10-24 | Discharge: 2014-10-27 | DRG: 988 | Disposition: A | Discharge status: Home / Self Care | Payer: Medicaid Other | Source: Ambulatory Visit | Attending: Obstetrics and Gynecology | Admitting: Obstetrics and Gynecology

## 2014-10-24 ENCOUNTER — Emergency Department (HOSPITAL_COMMUNITY): Payer: Medicaid Other

## 2014-10-24 DIAGNOSIS — N200 Calculus of kidney: Secondary | ICD-10-CM

## 2014-10-24 DIAGNOSIS — N730 Acute parametritis and pelvic cellulitis: Secondary | ICD-10-CM

## 2014-10-24 DIAGNOSIS — N132 Hydronephrosis with renal and ureteral calculous obstruction: Secondary | ICD-10-CM | POA: Diagnosis present

## 2014-10-24 DIAGNOSIS — N739 Female pelvic inflammatory disease, unspecified: Secondary | ICD-10-CM | POA: Diagnosis present

## 2014-10-24 DIAGNOSIS — R1110 Vomiting, unspecified: Secondary | ICD-10-CM

## 2014-10-24 DIAGNOSIS — N7011 Chronic salpingitis: Secondary | ICD-10-CM | POA: Diagnosis present

## 2014-10-24 DIAGNOSIS — A599 Trichomoniasis, unspecified: Secondary | ICD-10-CM | POA: Diagnosis present

## 2014-10-24 DIAGNOSIS — N23 Unspecified renal colic: Secondary | ICD-10-CM

## 2014-10-24 DIAGNOSIS — N201 Calculus of ureter: Secondary | ICD-10-CM

## 2014-10-24 DIAGNOSIS — Z79899 Other long term (current) drug therapy: Secondary | ICD-10-CM

## 2014-10-24 DIAGNOSIS — Z87891 Personal history of nicotine dependence: Secondary | ICD-10-CM

## 2014-10-24 DIAGNOSIS — R1030 Lower abdominal pain, unspecified: Secondary | ICD-10-CM

## 2014-10-24 LAB — CBC WITH DIFFERENTIAL/PLATELET
BASOS ABS: 0 10*3/uL (ref 0.0–0.1)
Basophils Relative: 0 % (ref 0–1)
EOS ABS: 0.1 10*3/uL (ref 0.0–0.7)
Eosinophils Relative: 1 % (ref 0–5)
HCT: 42.7 % (ref 36.0–46.0)
Hemoglobin: 14.4 g/dL (ref 12.0–15.0)
LYMPHS PCT: 33 % (ref 12–46)
Lymphs Abs: 2.2 10*3/uL (ref 0.7–4.0)
MCH: 30.8 pg (ref 26.0–34.0)
MCHC: 33.7 g/dL (ref 30.0–36.0)
MCV: 91.2 fL (ref 78.0–100.0)
MONO ABS: 0.8 10*3/uL (ref 0.1–1.0)
MONOS PCT: 12 % (ref 3–12)
NEUTROS PCT: 54 % (ref 43–77)
Neutro Abs: 3.7 10*3/uL (ref 1.7–7.7)
Platelets: 429 10*3/uL — ABNORMAL HIGH (ref 150–400)
RBC: 4.68 MIL/uL (ref 3.87–5.11)
RDW: 12.9 % (ref 11.5–15.5)
WBC: 6.8 10*3/uL (ref 4.0–10.5)

## 2014-10-24 LAB — URINALYSIS, ROUTINE W REFLEX MICROSCOPIC
Bilirubin Urine: NEGATIVE
Glucose, UA: NEGATIVE mg/dL
Ketones, ur: NEGATIVE mg/dL
NITRITE: NEGATIVE
Protein, ur: NEGATIVE mg/dL
Specific Gravity, Urine: 1.017 (ref 1.005–1.030)
Urobilinogen, UA: 0.2 mg/dL (ref 0.0–1.0)
pH: 6.5 (ref 5.0–8.0)

## 2014-10-24 LAB — COMPREHENSIVE METABOLIC PANEL
ALBUMIN: 4.5 g/dL (ref 3.5–5.2)
ALK PHOS: 83 U/L (ref 39–117)
ALT: 10 U/L (ref 0–35)
AST: 17 U/L (ref 0–37)
Anion gap: 9 (ref 5–15)
BUN: 15 mg/dL (ref 6–23)
CHLORIDE: 102 mmol/L (ref 96–112)
CO2: 28 mmol/L (ref 19–32)
CREATININE: 1.09 mg/dL (ref 0.50–1.10)
Calcium: 9.9 mg/dL (ref 8.4–10.5)
GFR calc Af Amer: 80 mL/min — ABNORMAL LOW (ref >90)
GFR calc non Af Amer: 69 mL/min — ABNORMAL LOW (ref >90)
GLUCOSE: 98 mg/dL (ref 70–99)
POTASSIUM: 3 mmol/L — AB (ref 3.5–5.1)
SODIUM: 139 mmol/L (ref 135–145)
Total Bilirubin: 0.7 mg/dL (ref 0.3–1.2)
Total Protein: 8.1 g/dL (ref 6.0–8.3)

## 2014-10-24 LAB — LIPASE, BLOOD: Lipase: 28 U/L (ref 11–59)

## 2014-10-24 LAB — RPR: RPR Ser Ql: NONREACTIVE

## 2014-10-24 LAB — URINE MICROSCOPIC-ADD ON

## 2014-10-24 LAB — HIV ANTIBODY (ROUTINE TESTING W REFLEX): HIV Screen 4th Generation wRfx: NONREACTIVE

## 2014-10-24 MED ORDER — DOXYCYCLINE HYCLATE 100 MG IV SOLR
100.0000 mg | Freq: Once | INTRAVENOUS | Status: AC
  Administered 2014-10-24: 100 mg via INTRAVENOUS
  Filled 2014-10-24: qty 100

## 2014-10-24 MED ORDER — HYDROMORPHONE HCL 1 MG/ML IJ SOLN
1.0000 mg | Freq: Once | INTRAMUSCULAR | Status: AC
  Administered 2014-10-24: 1 mg via INTRAVENOUS
  Filled 2014-10-24: qty 1

## 2014-10-24 MED ORDER — KETOROLAC TROMETHAMINE 30 MG/ML IJ SOLN
30.0000 mg | Freq: Once | INTRAMUSCULAR | Status: AC
  Administered 2014-10-24: 30 mg via INTRAVENOUS
  Filled 2014-10-24: qty 1

## 2014-10-24 MED ORDER — PROMETHAZINE HCL 25 MG/ML IJ SOLN
25.0000 mg | Freq: Once | INTRAMUSCULAR | Status: AC
  Administered 2014-10-24: 25 mg via INTRAVENOUS
  Filled 2014-10-24: qty 1

## 2014-10-24 MED ORDER — SODIUM CHLORIDE 0.9 % IV BOLUS (SEPSIS)
1000.0000 mL | Freq: Once | INTRAVENOUS | Status: AC
  Administered 2014-10-24: 1000 mL via INTRAVENOUS

## 2014-10-24 MED ORDER — ONDANSETRON HCL 4 MG/2ML IJ SOLN
4.0000 mg | Freq: Once | INTRAMUSCULAR | Status: AC
  Administered 2014-10-24: 4 mg via INTRAVENOUS
  Filled 2014-10-24: qty 2

## 2014-10-24 MED ORDER — POTASSIUM CHLORIDE 10 MEQ/100ML IV SOLN
10.0000 meq | INTRAVENOUS | Status: AC
  Administered 2014-10-24 (×3): 10 meq via INTRAVENOUS
  Filled 2014-10-24 (×3): qty 100

## 2014-10-24 MED ORDER — IOHEXOL 300 MG/ML  SOLN
50.0000 mL | Freq: Once | INTRAMUSCULAR | Status: AC | PRN
  Administered 2014-10-24: 25 mL via ORAL

## 2014-10-24 MED ORDER — DEXTROSE 5 % IV SOLN
2.0000 g | Freq: Once | INTRAVENOUS | Status: AC
  Administered 2014-10-24: 2 g via INTRAVENOUS
  Filled 2014-10-24: qty 2

## 2014-10-24 MED ORDER — IOHEXOL 300 MG/ML  SOLN
100.0000 mL | Freq: Once | INTRAMUSCULAR | Status: AC | PRN
  Administered 2014-10-24: 100 mL via INTRAVENOUS

## 2014-10-24 NOTE — ED Notes (Signed)
Pt is asking for water but was given oral contrast.

## 2014-10-24 NOTE — ED Notes (Addendum)
Pt from home c/o left sided abdominal pain. Was seen yesterday and DX with PID. She reports vomiting up doxycycline that was prescribed. Pt tearful in triage.

## 2014-10-24 NOTE — ED Notes (Signed)
Bed: EZ74 Expected date:  Expected time:  Means of arrival:  Comments: Hold for [place] in lobby

## 2014-10-24 NOTE — ED Notes (Signed)
Notified provider of increased pain and nausea. Pt requesting medication.

## 2014-10-24 NOTE — ED Provider Notes (Signed)
CSN: [id number]     Arrival date & time 10/24/14  1802 History   First MD Initiated Contact with Patient 10/24/14 1837     Chief Complaint  Patient presents with  . Abdominal Pain  . Emesis     (Consider location/radiation/quality/duration/timing/severity/associated sxs/prior Treatment) Patient is a 29 y.o. female presenting with abdominal pain and vomiting. The history is provided by the patient and medical records. No language interpreter was used.  Abdominal Pain Associated symptoms: nausea and vomiting   Associated symptoms: no chest pain, no constipation, no cough, no diarrhea, no dysuria, no fatigue, no fever, no hematuria and no shortness of breath   Emesis Associated symptoms: abdominal pain   Associated symptoms: no diarrhea      Karina [name] is a 29 y.o. female  with a hx of fibroids, multiple STDs, gestational hypertension, postpartum depression presents to the Emergency Department complaining of gradual, persistent, progressively worsening lower abdominal pain with persistent emesis onset yesterday. Patient reports recent history of STI which was only partially treated. She reports that in January she was diagnosed with chlamydia and trichomonas. She was prescribed doxycycline and metronidazole which she took for only several days, stopping once her symptoms had improved.  Patient reports she was evaluated here in the emergency department yesterday and offered admission however she declined. Patient reports that she has had persistent emesis and she left the department and continued worsening left lower quadrant and left-sided abdominal pain. She denies hematemesis. She denies known fever, chills, headache, chest pain, shortness of breath, weakness, dizziness, syncope.  LMP: One week ago.  Past Medical History  Diagnosis Date  . Fibroids   . Trichomonas   . Pregnancy   . Depression     Postpartum Depression with 1st pregnancy  . History of chlamydia   . Hypertension    gestational  . Pregnancy induced hypertension    Past Surgical History  Procedure Laterality Date  . Dilation and curettage of uterus    . Cervical cerclage     Family History  Problem Relation Age of Onset  . Hypertension Mother   . Hypertension Father    History  Substance Use Topics  . Smoking status: Former Smoker    Quit date: 08/12/2012  . Smokeless tobacco: Never Used  . Alcohol Use: No   OB History    Gravida Para Term Preterm AB TAB SAB Ectopic Multiple Living   6 2 1 1 3  0 3 0 0 2     Review of Systems  Constitutional: Negative for fever, diaphoresis, appetite change, fatigue and unexpected weight change.  HENT: Negative for mouth sores and trouble swallowing.   Respiratory: Negative for cough, chest tightness, shortness of breath, wheezing and stridor.   Cardiovascular: Negative for chest pain and palpitations.  Gastrointestinal: Positive for nausea, vomiting and abdominal pain. Negative for diarrhea, constipation, blood in stool, abdominal distention and rectal pain.  Genitourinary: Negative for dysuria, urgency, frequency, hematuria, flank pain and difficulty urinating.  Musculoskeletal: Negative for back pain, neck pain and neck stiffness.  Skin: Negative for rash.  Neurological: Negative for weakness.  Hematological: Negative for adenopathy.  Psychiatric/Behavioral: Negative for confusion.  All other systems reviewed and are negative.     Allergies  Review of patient's allergies indicates no known allergies.  Home Medications   Prior to Admission medications   Medication Sig Start Date End Date Taking? Authorizing Provider  doxycycline (VIBRAMYCIN) 100 MG capsule Take 1 capsule (100 mg total) by mouth 2 (two)  times daily. 10/23/14  Yes Stevie Kern Hedges, PA-C  HYDROcodone-acetaminophen (NORCO/VICODIN) 5-325 MG per tablet Take 1 tablet by mouth every 6 (six) hours as needed for moderate pain.   Yes Historical Provider, MD  ibuprofen (ADVIL,MOTRIN) 800  MG tablet Take 1 tablet (800 mg total) by mouth 3 (three) times daily. 08/28/14  Yes Charlesetta Shanks, MD  lisinopril-hydrochlorothiazide (PRINZIDE,ZESTORETIC) 20-25 MG per tablet Take 1 tablet by mouth daily. 08/27/14  Yes Lisa A Leftwich-Kirby, CNM  ondansetron (ZOFRAN) 4 MG tablet Take 1 tablet (4 mg total) by mouth every 6 (six) hours. 10/23/14  Yes Stevie Kern Hedges, PA-C  traMADol (ULTRAM) 50 MG tablet Take 1 tablet (50 mg total) by mouth every 6 (six) hours as needed. Patient not taking: Reported on 10/23/2014 08/28/14   Charlesetta Shanks, MD   BP 158/106 mmHg  Pulse 64  Temp(Src) 98.7 F (37.1 C) (Oral)  Resp 20  SpO2 98%  LMP 10/12/2014 Physical Exam  Constitutional: She appears well-developed and well-nourished. She appears distressed.  Awake, alert, nontoxic appearance Patient uncomfortable, rolling on the bed  HENT:  Head: Normocephalic and atraumatic.  Mouth/Throat: Oropharynx is clear and moist. No oropharyngeal exudate.  Eyes: Conjunctivae are normal. No scleral icterus.  Neck: Normal range of motion. Neck supple.  Cardiovascular: Normal rate, regular rhythm, normal heart sounds and intact distal pulses.   No murmur heard. tachycardia  Pulmonary/Chest: Effort normal and breath sounds normal. No respiratory distress. She has no wheezes.  Equal chest expansion  Abdominal: Soft. Bowel sounds are normal. She exhibits no distension and no mass. There is tenderness in the suprapubic area, left upper quadrant and left lower quadrant. There is no rebound, no guarding and no CVA tenderness. Hernia confirmed negative in the right inguinal area and confirmed negative in the left inguinal area.    Genitourinary: Uterus normal. No labial fusion. There is no rash, tenderness or lesion on the right labia. There is no rash, tenderness or lesion on the left labia. Uterus is not deviated, not enlarged, not fixed and not tender. Cervix exhibits motion tenderness. Cervix exhibits no discharge and no  friability. Right adnexum displays no mass, no tenderness and no fullness. Left adnexum displays tenderness. Left adnexum displays no mass and no fullness. No erythema, tenderness or bleeding in the vagina. No foreign body around the vagina. No signs of injury around the vagina. Vaginal discharge (moderate, thick) found.  Musculoskeletal: Normal range of motion. She exhibits no edema.  Lymphadenopathy:       Right: No inguinal adenopathy present.       Left: No inguinal adenopathy present.  Neurological: She is alert. She exhibits normal muscle tone. Coordination normal.  Speech is clear and goal oriented Moves extremities without ataxia  Skin: Skin is warm and dry. No rash noted. She is not diaphoretic. No erythema.  Psychiatric: She has a normal mood and affect.  Nursing note and vitals reviewed.   ED Course  Procedures (including critical care time) Labs Review Labs Reviewed  CBC WITH DIFFERENTIAL/PLATELET - Abnormal; Notable for the following:    Platelets 429 (*)    All other components within normal limits  COMPREHENSIVE METABOLIC PANEL - Abnormal; Notable for the following:    Potassium 3.0 (*)    GFR calc non Af Amer 69 (*)    GFR calc Af Amer 80 (*)    All other components within normal limits  URINALYSIS, ROUTINE W REFLEX MICROSCOPIC - Abnormal; Notable for the following:    Color, Urine  AMBER (*)    APPearance CLOUDY (*)    Hgb urine dipstick LARGE (*)    Leukocytes, UA SMALL (*)    All other components within normal limits  URINE MICROSCOPIC-ADD ON - Abnormal; Notable for the following:    Bacteria, UA FEW (*)    All other components within normal limits  URINE CULTURE  LIPASE, BLOOD    Imaging Review US Transvaginal Non-ob  10/23/2014   CLINICAL DATA:  Pelvic pain.  EXAM: TRANSABDOMINAL AND TRANSVAGINAL ULTRASOUND OF PELVIS  DOPPLER ULTRASOUND OF OVARIES  TECHNIQUE: Both transabdominal and transvaginal ultrasound examinations of the pelvis were performed.  Transabdominal technique was performed for global imaging of the pelvis including uterus, ovaries, adnexal regions, and pelvic cul-de-sac.  It was necessary to proceed with endovaginal exam following the transabdominal exam to visualize the ovaries. Color and duplex Doppler ultrasound was utilized to evaluate blood flow to the ovaries.  COMPARISON:  Multiple OB gyn ultrasounds  FINDINGS: Uterus  Measurements: Normal in size and homogeneous echotexture measuring 8.8 x 5.7 x 6.7 cm. No fibroids or other mass visualized.  Endometrium  Thickness: Normal in thickness for premenopausal female at 8.3 mm. No focal abnormality visualized.  Right ovary  Measurements: Normal in size of small follicles measuring 3.6 x 2.2 x 2.8 cm. Normal appearance/no adnexal mass.  Left ovary  Measurements: Normal size a small follicles measuring 3.4 x 2.1 x 3.1 cm per. Normal appearance/no adnexal mass.  Pulsed Doppler evaluation of both ovaries demonstrates normal low-resistance arterial and venous waveforms.  Other findings  No free fluid. Adjacent to the left ovary there is an anechoic tubular structure which measures upwards a 6 cm x 3 cm. No internal echoes.  IMPRESSION: 1. Normal uterus and endometrium. 2. Normal ovaries with normal color Doppler and spectral flow. 3. A tubular fluid collection adjacent to the left adnexa. 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 Less likely a peritoneal inclusion cyst. Nonemergent MRI could differentiate.   Electronically Signed   By: Suzy Bouchard M.D.   On: 10/23/2014 20:55   US Pelvis Complete  10/23/2014   CLINICAL DATA:  Pelvic pain.  EXAM: TRANSABDOMINAL AND TRANSVAGINAL ULTRASOUND OF PELVIS  DOPPLER ULTRASOUND OF OVARIES  TECHNIQUE: Both transabdominal and transvaginal ultrasound examinations of the pelvis were performed. Transabdominal technique was performed for global imaging of the pelvis including uterus, ovaries, adnexal regions, and pelvic cul-de-sac.  It was necessary to proceed with  endovaginal exam following the transabdominal exam to visualize the ovaries. Color and duplex Doppler ultrasound was utilized to evaluate blood flow to the ovaries.  COMPARISON:  Multiple OB gyn ultrasounds  FINDINGS: Uterus  Measurements: Normal in size and homogeneous echotexture measuring 8.8 x 5.7 x 6.7 cm. No fibroids or other mass visualized.  Endometrium  Thickness: Normal in thickness for premenopausal female at 8.3 mm. No focal abnormality visualized.  Right ovary  Measurements: Normal in size of small follicles measuring 3.6 x 2.2 x 2.8 cm. Normal appearance/no adnexal mass.  Left ovary  Measurements: Normal size a small follicles measuring 3.4 x 2.1 x 3.1 cm per. Normal appearance/no adnexal mass.  Pulsed Doppler evaluation of both ovaries demonstrates normal low-resistance arterial and venous waveforms.  Other findings  No free fluid. Adjacent to the left ovary there is an anechoic tubular structure which measures upwards a 6 cm x 3 cm. No internal echoes.  IMPRESSION: 1. Normal uterus and endometrium. 2. Normal ovaries with normal color Doppler and spectral flow. 3. A tubular fluid collection adjacent to  the left adnexa. 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 Less likely a peritoneal inclusion cyst. Nonemergent MRI could differentiate.   Electronically Signed   By: Suzy Bouchard M.D.   On: 10/23/2014 20:55   Ct Abdomen Pelvis W Contrast  10/24/2014   CLINICAL DATA:  Left-sided abdominal pain for 2 days. Recent diagnosis of pelvic inflammatory disease.  EXAM: CT ABDOMEN AND PELVIS WITH CONTRAST  TECHNIQUE: Multidetector CT imaging of the abdomen and pelvis was performed using the standard protocol following bolus administration of intravenous contrast.  CONTRAST:  5mL OMNIPAQUE IOHEXOL 300 MG/ML SOLN, 157mL OMNIPAQUE IOHEXOL 300 MG/ML SOLN  COMPARISON:  None.  FINDINGS: BODY WALL: Unremarkable.  LOWER CHEST: Unremarkable.  ABDOMEN/PELVIS:  Liver: No focal abnormality.  Biliary: No evidence of  biliary obstruction or stone.  Pancreas: Unremarkable.  Spleen: Unremarkable.  Adrenals: Unremarkable.  Kidneys and ureters: 5 mm stone at the left ureteral vesicular junction with mild hydroureter and borderline hydronephrosis. There is marked urothelial thickening throughout the left urinary collecting system, with delayed left renal enhancement. Additional punctate stone present in the left lower pole. No right-sided hydronephrosis.  Bladder: Unremarkable.  Reproductive: Fluid collection around the left ovary is again noted, unchanged since pelvic sonography 1 day prior. The adjacent ovary has a normal appearance.  Bowel: No obstruction. Normal appendix.  Retroperitoneum: No mass or adenopathy.  Peritoneum: No ascites or pneumoperitoneum.  Vascular: No acute abnormality.  OSSEOUS: No acute abnormalities.  IMPRESSION: 1. 5 mm stone at the left UVJ with prominent ureteritis and mild obstructive change. 2. Left adnexal cystic collection, reference pelvic sonography from yesterday.   Electronically Signed   By: Monte Fantasia M.D.   On: 10/24/2014 21:59   [place] Art/ven Flow Abd Pelv Doppler  10/23/2014   CLINICAL DATA:  Pelvic pain.  EXAM: TRANSABDOMINAL AND TRANSVAGINAL ULTRASOUND OF PELVIS  DOPPLER ULTRASOUND OF OVARIES  TECHNIQUE: Both transabdominal and transvaginal ultrasound examinations of the pelvis were performed. Transabdominal technique was performed for global imaging of the pelvis including uterus, ovaries, adnexal regions, and pelvic cul-de-sac.  It was necessary to proceed with endovaginal exam following the transabdominal exam to visualize the ovaries. Color and duplex Doppler ultrasound was utilized to evaluate blood flow to the ovaries.  COMPARISON:  Multiple OB gyn ultrasounds  FINDINGS: Uterus  Measurements: Normal in size and homogeneous echotexture measuring 8.8 x 5.7 x 6.7 cm. No fibroids or other mass visualized.  Endometrium  Thickness: Normal in thickness for premenopausal female at 8.3 mm.  No focal abnormality visualized.  Right ovary  Measurements: Normal in size of small follicles measuring 3.6 x 2.2 x 2.8 cm. Normal appearance/no adnexal mass.  Left ovary  Measurements: Normal size a small follicles measuring 3.4 x 2.1 x 3.1 cm per. Normal appearance/no adnexal mass.  Pulsed Doppler evaluation of both ovaries demonstrates normal low-resistance arterial and venous waveforms.  Other findings  No free fluid. Adjacent to the left ovary there is an anechoic tubular structure which measures upwards a 6 cm x 3 cm. No internal echoes.  IMPRESSION: 1. Normal uterus and endometrium. 2. Normal ovaries with normal color Doppler and spectral flow. 3. A tubular fluid collection adjacent to the left adnexa. 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 Less likely a peritoneal inclusion cyst. Nonemergent MRI could differentiate.   Electronically Signed   By: Suzy Bouchard M.D.   On: 10/23/2014 20:55     EKG Interpretation None      MDM   Final diagnoses:  Lower abdominal pain  PID (acute pelvic  inflammatory disease)  Left ureteral stone  Renal colic on left side  Intractable vomiting with nausea, vomiting of unspecified type   Karina [name] presents with dx of PID yesterday. Patient with trial of by mouth control at home however she's had persistent vomiting.  Will recheck labs, obtain CT scan to rule out TOA give pain control and reassess. Discussed with patient my concern about progressing PID and the need for admission.  Patient is without leukocytosis, she is afebrile and non-tachycardic.  Patient did not have infected urine yesterday. UA pending.    Tender 5 PM CT scan with 5 mm stone at the left UVJ with mild hydroureter and borderline hydronephrosis. Pelvic exam repeated with persistent cervical motion and left adnexal tenderness. This is the same location as her hydrosalpinx seen on ultrasound yesterday. Yesterday patient had a wet prep with positive for trichomonas and many white blood  cells. Patient continues to have abdominal and left flank pain with persistent nausea and vomiting. It is unclear at this time whether or not patient's pain and intractable vomiting are secondary to PID or renal colic.  Regardless, patient is unable to tolerate by mouth antibiotics to treat her PID. Will admit to faculty practice.  UA was without evidence of UTI to indicate infected stone.  11:45PM Pt discussed with Dr. Silas [place] who will admit for PID treatment.  Will also consult with urology.  12:33 AM Pt discussed with Dr. Tresa Moore of urology who will assess the patient in the morning at [facility]    BP 158/106 mmHg  Pulse 64  Temp(Src) 98.7 F (37.1 C) (Oral)  Resp 20  SpO2 98%  LMP 10/12/2014   Jarrett Soho Raequan Vanschaick, PA-C 10/25/14 [id number]  Dorie Rank, MD 10/25/14 [phone]

## 2014-10-25 ENCOUNTER — Inpatient Hospital Stay (HOSPITAL_COMMUNITY): Payer: Medicaid Other | Admitting: Anesthesiology

## 2014-10-25 ENCOUNTER — Encounter (HOSPITAL_COMMUNITY): Payer: Self-pay | Admitting: Certified Registered"

## 2014-10-25 ENCOUNTER — Encounter (HOSPITAL_COMMUNITY)
Admission: AD | Disposition: A | Discharge status: Home / Self Care | Payer: Self-pay | Source: Ambulatory Visit | Attending: Obstetrics and Gynecology

## 2014-10-25 DIAGNOSIS — N132 Hydronephrosis with renal and ureteral calculous obstruction: Secondary | ICD-10-CM | POA: Diagnosis present

## 2014-10-25 DIAGNOSIS — Z87891 Personal history of nicotine dependence: Secondary | ICD-10-CM | POA: Diagnosis not present

## 2014-10-25 DIAGNOSIS — A5901 Trichomonal vulvovaginitis: Secondary | ICD-10-CM

## 2014-10-25 DIAGNOSIS — N739 Female pelvic inflammatory disease, unspecified: Secondary | ICD-10-CM | POA: Diagnosis present

## 2014-10-25 DIAGNOSIS — Z79899 Other long term (current) drug therapy: Secondary | ICD-10-CM | POA: Diagnosis not present

## 2014-10-25 DIAGNOSIS — A599 Trichomoniasis, unspecified: Secondary | ICD-10-CM | POA: Diagnosis present

## 2014-10-25 DIAGNOSIS — N200 Calculus of kidney: Secondary | ICD-10-CM

## 2014-10-25 DIAGNOSIS — N7011 Chronic salpingitis: Secondary | ICD-10-CM | POA: Diagnosis present

## 2014-10-25 LAB — CBC WITH DIFFERENTIAL/PLATELET
Basophils Absolute: 0 10*3/uL (ref 0.0–0.1)
Basophils Relative: 0 % (ref 0–1)
EOS PCT: 1 % (ref 0–5)
Eosinophils Absolute: 0.1 10*3/uL (ref 0.0–0.7)
HEMATOCRIT: 39.2 % (ref 36.0–46.0)
Hemoglobin: 13.5 g/dL (ref 12.0–15.0)
LYMPHS ABS: 2.6 10*3/uL (ref 0.7–4.0)
Lymphocytes Relative: 36 % (ref 12–46)
MCH: 31.5 pg (ref 26.0–34.0)
MCHC: 34.4 g/dL (ref 30.0–36.0)
MCV: 91.4 fL (ref 78.0–100.0)
MONO ABS: 0.8 10*3/uL (ref 0.1–1.0)
Monocytes Relative: 12 % (ref 3–12)
Neutro Abs: 3.6 10*3/uL (ref 1.7–7.7)
Neutrophils Relative %: 51 % (ref 43–77)
Platelets: 344 10*3/uL (ref 150–400)
RBC: 4.29 MIL/uL (ref 3.87–5.11)
RDW: 13 % (ref 11.5–15.5)
WBC: 7.1 10*3/uL (ref 4.0–10.5)

## 2014-10-25 LAB — CREATININE, SERUM
Creatinine, Ser: 0.91 mg/dL (ref 0.50–1.10)
GFR calc Af Amer: >90 mL/min (ref >90)
GFR calc non Af Amer: 86 mL/min — ABNORMAL LOW (ref >90)

## 2014-10-25 LAB — GC/CHLAMYDIA PROBE AMP (~~LOC~~) NOT AT ARMC
Chlamydia: NEGATIVE
Neisseria Gonorrhea: NEGATIVE

## 2014-10-25 SURGERY — CYSTOSCOPY/URETEROSCOPY/HOLMIUM LASER/STENT PLACEMENT
Anesthesia: General | Site: Ureter | Laterality: Left

## 2014-10-25 MED ORDER — LABETALOL HCL 5 MG/ML IV SOLN
5.0000 mg | Freq: Once | INTRAVENOUS | Status: DC
Start: 2014-10-25 — End: 2014-10-25

## 2014-10-25 MED ORDER — LABETALOL HCL 5 MG/ML IV SOLN
5.0000 mg | INTRAVENOUS | Status: DC | PRN
  Administered 2014-10-25: 5 mg via INTRAVENOUS

## 2014-10-25 MED ORDER — LACTATED RINGERS IV SOLN
INTRAVENOUS | Status: DC | PRN
  Administered 2014-10-25: 18:00:00 via INTRAVENOUS

## 2014-10-25 MED ORDER — LABETALOL HCL 5 MG/ML IV SOLN: 10.0000 mg | INTRAVENOUS | Status: DC | PRN

## 2014-10-25 MED ORDER — LIDOCAINE HCL (CARDIAC) 20 MG/ML IV SOLN
INTRAVENOUS | Status: DC | PRN
  Administered 2014-10-25: 40 mg via INTRAVENOUS

## 2014-10-25 MED ORDER — 0.9 % SODIUM CHLORIDE (POUR BTL) OPTIME
TOPICAL | Status: DC | PRN
  Administered 2014-10-25: 1000 mL

## 2014-10-25 MED ORDER — DEXAMETHASONE SODIUM PHOSPHATE 10 MG/ML IJ SOLN
INTRAMUSCULAR | Status: DC | PRN
  Administered 2014-10-25: 10 mg via INTRAVENOUS

## 2014-10-25 MED ORDER — PROPOFOL 10 MG/ML IV BOLUS
INTRAVENOUS | Status: DC | PRN
  Administered 2014-10-25: 200 mg via INTRAVENOUS

## 2014-10-25 MED ORDER — SUCCINYLCHOLINE CHLORIDE 20 MG/ML IJ SOLN
INTRAMUSCULAR | Status: DC | PRN
  Administered 2014-10-25: 100 mg via INTRAVENOUS

## 2014-10-25 MED ORDER — DOXYCYCLINE HYCLATE 100 MG IV SOLR
100.0000 mg | Freq: Two times a day (BID) | INTRAVENOUS | Status: DC
  Administered 2014-10-25 (×2): 100 mg via INTRAVENOUS
  Filled 2014-10-25 (×3): qty 100

## 2014-10-25 MED ORDER — DEXTROSE 5 % IV SOLN
2.0000 g | Freq: Four times a day (QID) | INTRAVENOUS | Status: DC
  Administered 2014-10-25 – 2014-10-27 (×10): 2 g via INTRAVENOUS
  Filled 2014-10-25 (×12): qty 2

## 2014-10-25 MED ORDER — IOHEXOL 300 MG/ML  SOLN
INTRAMUSCULAR | Status: DC | PRN
  Administered 2014-10-25: 10 mL

## 2014-10-25 MED ORDER — ONDANSETRON HCL 4 MG/2ML IJ SOLN
4.0000 mg | Freq: Once | INTRAMUSCULAR | Status: AC
  Administered 2014-10-25: 4 mg via INTRAVENOUS
  Filled 2014-10-25: qty 2

## 2014-10-25 MED ORDER — ENOXAPARIN SODIUM 40 MG/0.4ML ~~LOC~~ SOLN
40.0000 mg | SUBCUTANEOUS | Status: DC
  Filled 2014-10-25: qty 0.4

## 2014-10-25 MED ORDER — MIDAZOLAM HCL 5 MG/5ML IJ SOLN
INTRAMUSCULAR | Status: DC | PRN
  Administered 2014-10-25: 2 mg via INTRAVENOUS

## 2014-10-25 MED ORDER — LABETALOL HCL 5 MG/ML IV SOLN
INTRAVENOUS | Status: AC
  Administered 2014-10-25: 5 mg via INTRAVENOUS
  Filled 2014-10-25: qty 4

## 2014-10-25 MED ORDER — ONDANSETRON HCL 4 MG/2ML IJ SOLN
4.0000 mg | Freq: Once | INTRAMUSCULAR | Status: AC | PRN
  Administered 2014-10-25: 4 mg via INTRAVENOUS
  Filled 2014-10-25: qty 2

## 2014-10-25 MED ORDER — FENTANYL CITRATE 0.05 MG/ML IJ SOLN
INTRAMUSCULAR | Status: DC | PRN
  Administered 2014-10-25: 100 ug via INTRAVENOUS

## 2014-10-25 MED ORDER — FENTANYL CITRATE 0.05 MG/ML IJ SOLN
25.0000 ug | INTRAMUSCULAR | Status: DC | PRN
  Administered 2014-10-25: 50 ug via INTRAVENOUS

## 2014-10-25 MED ORDER — KETOROLAC TROMETHAMINE 30 MG/ML IJ SOLN
30.0000 mg | Freq: Three times a day (TID) | INTRAMUSCULAR | Status: DC
  Administered 2014-10-25 – 2014-10-27 (×6): 30 mg via INTRAVENOUS
  Filled 2014-10-25 (×7): qty 1

## 2014-10-25 MED ORDER — LISINOPRIL 20 MG PO TABS
20.0000 mg | ORAL_TABLET | Freq: Every day | ORAL | Status: DC
  Administered 2014-10-25 – 2014-10-27 (×3): 20 mg via ORAL
  Filled 2014-10-25 (×4): qty 1

## 2014-10-25 MED ORDER — FENTANYL CITRATE 0.05 MG/ML IJ SOLN
INTRAMUSCULAR | Status: AC
  Filled 2014-10-25: qty 2

## 2014-10-25 MED ORDER — SODIUM CHLORIDE 0.9 % IR SOLN
Status: DC | PRN
  Administered 2014-10-25: 1000 mL
  Administered 2014-10-25: 3000 mL
  Administered 2014-10-25: 1000 mL

## 2014-10-25 MED ORDER — HYDROMORPHONE HCL 1 MG/ML IJ SOLN
1.0000 mg | INTRAMUSCULAR | Status: DC | PRN
  Administered 2014-10-25 – 2014-10-27 (×11): 1 mg via INTRAVENOUS
  Filled 2014-10-25 (×11): qty 1

## 2014-10-25 MED ORDER — HYDROCHLOROTHIAZIDE 25 MG PO TABS
25.0000 mg | ORAL_TABLET | Freq: Every day | ORAL | Status: DC
  Administered 2014-10-25 – 2014-10-27 (×3): 25 mg via ORAL
  Filled 2014-10-25 (×3): qty 1

## 2014-10-25 MED ORDER — SODIUM CHLORIDE 0.45 % IV SOLN
INTRAVENOUS | Status: DC
  Administered 2014-10-25: 07:00:00 via INTRAVENOUS

## 2014-10-25 MED ORDER — LABETALOL HCL 5 MG/ML IV SOLN
INTRAVENOUS | Status: DC | PRN
  Administered 2014-10-25 (×2): 5 mg via INTRAVENOUS

## 2014-10-25 MED ORDER — LACTATED RINGERS IV SOLN
INTRAVENOUS | Status: DC
  Administered 2014-10-25 – 2014-10-27 (×4): via INTRAVENOUS

## 2014-10-25 SURGICAL SUPPLY — 24 items
BAG URO CATCHER STRL LF (DRAPE) ×3 IMPLANT
BASKET LASER NITINOL 1.9FR (BASKET) ×3 IMPLANT
BSKT STON RTRVL 120 1.9FR (BASKET) ×1
CATH INTERMIT  6FR 70CM (CATHETERS) ×3 IMPLANT
CLOTH BEACON ORANGE TIMEOUT ST (SAFETY) ×3 IMPLANT
FIBER LASER FLEXIVA 1000 (UROLOGICAL SUPPLIES) IMPLANT
FIBER LASER FLEXIVA 200 (UROLOGICAL SUPPLIES) ×2 IMPLANT
FIBER LASER FLEXIVA 365 (UROLOGICAL SUPPLIES) IMPLANT
FIBER LASER FLEXIVA 550 (UROLOGICAL SUPPLIES) IMPLANT
FIBER LASER TRAC TIP (UROLOGICAL SUPPLIES) ×3 IMPLANT
GLOVE BIOGEL M STRL SZ7.5 (GLOVE) ×3 IMPLANT
GOWN STRL REUS W/TWL LRG LVL3 (GOWN DISPOSABLE) ×6 IMPLANT
GUIDEWIRE ANG ZIPWIRE 038X150 (WIRE) ×3 IMPLANT
GUIDEWIRE STR DUAL SENSOR (WIRE) ×3 IMPLANT
IV NS 1000ML (IV SOLUTION) ×3
IV NS 1000ML BAXH (IV SOLUTION) ×1 IMPLANT
MANIFOLD NEPTUNE II (INSTRUMENTS) ×3 IMPLANT
PACK CYSTO (CUSTOM PROCEDURE TRAY) ×3 IMPLANT
SHIELD EYE BINOCULAR (MISCELLANEOUS) ×3 IMPLANT
STENT POLARIS 5FRX24 (STENTS) ×2 IMPLANT
SYR CONTROL 10ML LL (SYRINGE) ×3 IMPLANT
TUBE FEEDING 8FR 16IN STR KANG (MISCELLANEOUS) ×6 IMPLANT
TUBING CONNECTING 10 (TUBING) ×2 IMPLANT
TUBING CONNECTING 10' (TUBING) ×1

## 2014-10-25 NOTE — Brief Op Note (Signed)
10/24/2014 - 10/25/2014  6:59 PM  PATIENT:  Karina [name]  29 y.o. female  PRE-OPERATIVE DIAGNOSIS:  left ureteral stone  POST-OPERATIVE DIAGNOSIS:  left ureteral stone  PROCEDURE:  Procedure(s): CYSTOSCOPY/LEFT URETEROSCOPY/HOLMIUM LASER/STONE BASKETRY/LEFT STENT PLACEMENT (Left)  SURGEON:  Surgeon(s) and Role:    * Alexis Frock, MD - Primary  PHYSICIAN ASSISTANT:   ASSISTANTS: none   ANESTHESIA:   general  EBL:  Total I/O In: -  Out: 1200 [Urine:1200]  BLOOD ADMINISTERED:none  DRAINS: none   LOCAL MEDICATIONS USED:  NONE  SPECIMEN:  Source of Specimen:  Left distal ureteral stone  DISPOSITION OF SPECIMEN:  [facility] for compositional analysis  COUNTS:  YES  TOURNIQUET:  * No tourniquets in log *  DICTATION: .Other Dictation: Dictation Number [id number]  PLAN OF CARE: Admit to inpatient   PATIENT DISPOSITION:  PACU - hemodynamically stable.   Delay start of Pharmacological VTE agent (>24hrs) due to surgical blood loss or risk of bleeding: not applicable

## 2014-10-25 NOTE — Anesthesia Postprocedure Evaluation (Signed)
  Anesthesia Post-op Note  Patient: Karina [name]  Procedure(s) Performed: Procedure(s) (LRB): CYSTOSCOPY/LEFT URETEROSCOPY/HOLMIUM LASER/STONE BASKETRY/LEFT STENT PLACEMENT (Left)  Patient Location: PACU  Anesthesia Type: General  Level of Consciousness: awake and alert   Airway and Oxygen Therapy: Patient Spontanous Breathing  Post-op Pain: mild  Post-op Assessment: Post-op Vital signs reviewed, Patient's Cardiovascular Status Stable, Respiratory Function Stable, Patent Airway and No signs of Nausea or vomiting  Last Vitals:  Filed Vitals:   10/25/14 1200  BP: 130/100  Pulse: 56  Temp: 37.1 C  Resp: 18    Post-op Vital Signs: stable   Complications: No apparent anesthesia complications

## 2014-10-25 NOTE — Consult Note (Signed)
Reason for Consult: Left Ureteral Stone  Referring Physician: Hillard Danker MD  Karina [name] is an 29 y.o. female.   HPI:   1 - Left Ureteral Stone - 62m left UVJ stone with very mild hydro by ER CT 10/25/14 (SSD 11cm, 320HU) on eval refractory pelvic pain and nausea with emesis. Stone is visible medial to left mid femoral head on scout images. No additional stones. UA without bacteruria, no fevers / tachycardia. Has had symptom prodrome of nearly 1 month, now much worse x few days.   PMH sig for recurrent STD.   Today "BOrion is seen in consultation for above. [facility]. Left ureteral stone as cause for her symptoms. No fevers overnight. Remains with significant nausea + emesis, unable to maintain hydration. Straining urine and no interval stone passage.    Past Medical History  Diagnosis Date  . Fibroids   . Trichomonas   . Pregnancy   . Depression     Postpartum Depression with 1st pregnancy  . History of chlamydia   . Hypertension     gestational  . Pregnancy induced hypertension     Past Surgical History  Procedure Laterality Date  . Dilation and curettage of uterus    . Cervical cerclage      Family History  Problem Relation Age of Onset  . Hypertension Mother   . Hypertension Father     Social History:  reports that she quit smoking about 2 years ago. She has never used smokeless tobacco. She reports that she does not drink alcohol or use illicit drugs.  Allergies: No Known Allergies  Medications: I have reviewed the patient's current medications.  Results for orders placed or performed during the hospital encounter of 10/24/14 (from the past 48 hour(s))  CBC with Differential/Platelet     Status: Abnormal   Collection Time: 10/24/14  7:05 PM  Result Value Ref Range   WBC 6.8 4.0 - 10.5 K/uL   RBC 4.68 3.87 - 5.11 MIL/uL   Hemoglobin 14.4 12.0 - 15.0 g/dL   HCT 42.7 36.0 - 46.0 %   MCV 91.2 78.0  - 100.0 fL   MCH 30.8 26.0 - 34.0 pg   MCHC 33.7 30.0 - 36.0 g/dL   RDW 12.9 11.5 - 15.5 %   Platelets 429 (H) 150 - 400 K/uL   Neutrophils Relative % 54 43 - 77 %   Neutro Abs 3.7 1.7 - 7.7 K/uL   Lymphocytes Relative 33 12 - 46 %   Lymphs Abs 2.2 0.7 - 4.0 K/uL   Monocytes Relative 12 3 - 12 %   Monocytes Absolute 0.8 0.1 - 1.0 K/uL   Eosinophils Relative 1 0 - 5 %   Eosinophils Absolute 0.1 0.0 - 0.7 K/uL   Basophils Relative 0 0 - 1 %   Basophils Absolute 0.0 0.0 - 0.1 K/uL  Comprehensive metabolic panel     Status: Abnormal   Collection Time: 10/24/14  7:05 PM  Result Value Ref Range   Sodium 139 135 - 145 mmol/L   Potassium 3.0 (L) 3.5 - 5.1 mmol/L    Comment: DELTA CHECK NOTED REPEATED TO VERIFY    Chloride 102 96 - 112 mmol/L   CO2 28 19 - 32 mmol/L   Glucose, Bld 98 70 - 99 mg/dL   BUN 15 6 - 23 mg/dL   Creatinine, Ser 1.09 0.50 - 1.10 mg/dL   Calcium 9.9 8.4 -  10.5 mg/dL   Total Protein 8.1 6.0 - 8.3 g/dL   Albumin 4.5 3.5 - 5.2 g/dL   AST 17 0 - 37 U/L   ALT 10 0 - 35 U/L   Alkaline Phosphatase 83 39 - 117 U/L   Total Bilirubin 0.7 0.3 - 1.2 mg/dL   GFR calc non Af Amer 69 (L) >90 mL/min   GFR calc Af Amer 80 (L) >90 mL/min    Comment: (NOTE) The eGFR has been calculated using the CKD EPI equation. This calculation has not been validated in all clinical situations. eGFR's persistently <90 mL/min signify possible Chronic Kidney Disease.    Anion gap 9 5 - 15  Lipase, blood     Status: None   Collection Time: 10/24/14  7:05 PM  Result Value Ref Range   Lipase 28 11 - 59 U/L  Urinalysis, Routine w reflex microscopic     Status: Abnormal   Collection Time: 10/24/14 10:54 PM  Result Value Ref Range   Color, Urine AMBER (A) YELLOW    Comment: BIOCHEMICALS MAY BE AFFECTED BY COLOR   APPearance CLOUDY (A) CLEAR   Specific Gravity, Urine 1.017 1.005 - 1.030   pH 6.5 5.0 - 8.0   Glucose, UA NEGATIVE NEGATIVE mg/dL   Hgb urine dipstick LARGE (A) NEGATIVE    Bilirubin Urine NEGATIVE NEGATIVE   Ketones, ur NEGATIVE NEGATIVE mg/dL   Protein, ur NEGATIVE NEGATIVE mg/dL   Urobilinogen, UA 0.2 0.0 - 1.0 mg/dL   Nitrite NEGATIVE NEGATIVE   Leukocytes, UA SMALL (A) NEGATIVE  Urine microscopic-add on     Status: Abnormal   Collection Time: 10/24/14 10:54 PM  Result Value Ref Range   Squamous Epithelial / LPF RARE RARE   WBC, UA 3-6 <3 WBC/hpf   RBC / HPF TOO NUMEROUS TO COUNT <3 RBC/hpf   Bacteria, UA FEW (A) RARE    US Transvaginal Non-ob  10/23/2014   CLINICAL DATA:  Pelvic pain.  EXAM: TRANSABDOMINAL AND TRANSVAGINAL ULTRASOUND OF PELVIS  DOPPLER ULTRASOUND OF OVARIES  TECHNIQUE: Both transabdominal and transvaginal ultrasound examinations of the pelvis were performed. Transabdominal technique was performed for global imaging of the pelvis including uterus, ovaries, adnexal regions, and pelvic cul-de-sac.  It was necessary to proceed with endovaginal exam following the transabdominal exam to visualize the ovaries. Color and duplex Doppler ultrasound was utilized to evaluate blood flow to the ovaries.  COMPARISON:  Multiple OB gyn ultrasounds  FINDINGS: Uterus  Measurements: Normal in size and homogeneous echotexture measuring 8.8 x 5.7 x 6.7 cm. No fibroids or other mass visualized.  Endometrium  Thickness: Normal in thickness for premenopausal female at 8.3 mm. No focal abnormality visualized.  Right ovary  Measurements: Normal in size of small follicles measuring 3.6 x 2.2 x 2.8 cm. Normal appearance/no adnexal mass.  Left ovary  Measurements: Normal size a small follicles measuring 3.4 x 2.1 x 3.1 cm per. Normal appearance/no adnexal mass.  Pulsed Doppler evaluation of both ovaries demonstrates normal low-resistance arterial and venous waveforms.  Other findings  No free fluid. Adjacent to the left ovary there is an anechoic tubular structure which measures upwards a 6 cm x 3 cm. No internal echoes.  IMPRESSION: 1. Normal uterus and endometrium. 2. Normal  ovaries with normal color Doppler and spectral flow. 3. A tubular fluid collection adjacent to the left adnexa. 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 Less likely a peritoneal inclusion cyst. Nonemergent MRI could differentiate.   Electronically Signed   By: Suzy Bouchard  M.D.   On: 10/23/2014 20:55   US Pelvis Complete  10/23/2014   CLINICAL DATA:  Pelvic pain.  EXAM: TRANSABDOMINAL AND TRANSVAGINAL ULTRASOUND OF PELVIS  DOPPLER ULTRASOUND OF OVARIES  TECHNIQUE: Both transabdominal and transvaginal ultrasound examinations of the pelvis were performed. Transabdominal technique was performed for global imaging of the pelvis including uterus, ovaries, adnexal regions, and pelvic cul-de-sac.  It was necessary to proceed with endovaginal exam following the transabdominal exam to visualize the ovaries. Color and duplex Doppler ultrasound was utilized to evaluate blood flow to the ovaries.  COMPARISON:  Multiple OB gyn ultrasounds  FINDINGS: Uterus  Measurements: Normal in size and homogeneous echotexture measuring 8.8 x 5.7 x 6.7 cm. No fibroids or other mass visualized.  Endometrium  Thickness: Normal in thickness for premenopausal female at 8.3 mm. No focal abnormality visualized.  Right ovary  Measurements: Normal in size of small follicles measuring 3.6 x 2.2 x 2.8 cm. Normal appearance/no adnexal mass.  Left ovary  Measurements: Normal size a small follicles measuring 3.4 x 2.1 x 3.1 cm per. Normal appearance/no adnexal mass.  Pulsed Doppler evaluation of both ovaries demonstrates normal low-resistance arterial and venous waveforms.  Other findings  No free fluid. Adjacent to the left ovary there is an anechoic tubular structure which measures upwards a 6 cm x 3 cm. No internal echoes.  IMPRESSION: 1. Normal uterus and endometrium. 2. Normal ovaries with normal color Doppler and spectral flow. 3. A tubular fluid collection adjacent to the left adnexa. 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 Less likely a peritoneal  inclusion cyst. Nonemergent MRI could differentiate.   Electronically Signed   By: Suzy Bouchard M.D.   On: 10/23/2014 20:55   Ct Abdomen Pelvis W Contrast  10/24/2014   CLINICAL DATA:  Left-sided abdominal pain for 2 days. Recent diagnosis of pelvic inflammatory disease.  EXAM: CT ABDOMEN AND PELVIS WITH CONTRAST  TECHNIQUE: Multidetector CT imaging of the abdomen and pelvis was performed using the standard protocol following bolus administration of intravenous contrast.  CONTRAST:  34m OMNIPAQUE IOHEXOL 300 MG/ML SOLN, 1071mOMNIPAQUE IOHEXOL 300 MG/ML SOLN  COMPARISON:  None.  FINDINGS: BODY WALL: Unremarkable.  LOWER CHEST: Unremarkable.  ABDOMEN/PELVIS:  Liver: No focal abnormality.  Biliary: No evidence of biliary obstruction or stone.  Pancreas: Unremarkable.  Spleen: Unremarkable.  Adrenals: Unremarkable.  Kidneys and ureters: 5 mm stone at the left ureteral vesicular junction with mild hydroureter and borderline hydronephrosis. There is marked urothelial thickening throughout the left urinary collecting system, with delayed left renal enhancement. Additional punctate stone present in the left lower pole. No right-sided hydronephrosis.  Bladder: Unremarkable.  Reproductive: Fluid collection around the left ovary is again noted, unchanged since pelvic sonography 1 day prior. The adjacent ovary has a normal appearance.  Bowel: No obstruction. Normal appendix.  Retroperitoneum: No mass or adenopathy.  Peritoneum: No ascites or pneumoperitoneum.  Vascular: No acute abnormality.  OSSEOUS: No acute abnormalities.  IMPRESSION: 1. 5 mm stone at the left UVJ with prominent ureteritis and mild obstructive change. 2. Left adnexal cystic collection, reference pelvic sonography from yesterday.   Electronically Signed   By: JoMonte Fantasia.D.   On: 10/24/2014 21:59   UsKoreart/ven Flow Abd Pelv Doppler  10/23/2014   CLINICAL DATA:  Pelvic pain.  EXAM: TRANSABDOMINAL AND TRANSVAGINAL ULTRASOUND OF PELVIS  DOPPLER  ULTRASOUND OF OVARIES  TECHNIQUE: Both transabdominal and transvaginal ultrasound examinations of the pelvis were performed. Transabdominal technique was performed for global imaging of the pelvis including uterus,  ovaries, adnexal regions, and pelvic cul-de-sac.  It was necessary to proceed with endovaginal exam following the transabdominal exam to visualize the ovaries. Color and duplex Doppler ultrasound was utilized to evaluate blood flow to the ovaries.  COMPARISON:  Multiple OB gyn ultrasounds  FINDINGS: Uterus  Measurements: Normal in size and homogeneous echotexture measuring 8.8 x 5.7 x 6.7 cm. No fibroids or other mass visualized.  Endometrium  Thickness: Normal in thickness for premenopausal female at 8.3 mm. No focal abnormality visualized.  Right ovary  Measurements: Normal in size of small follicles measuring 3.6 x 2.2 x 2.8 cm. Normal appearance/no adnexal mass.  Left ovary  Measurements: Normal size a small follicles measuring 3.4 x 2.1 x 3.1 cm per. Normal appearance/no adnexal mass.  Pulsed Doppler evaluation of both ovaries demonstrates normal low-resistance arterial and venous waveforms.  Other findings  No free fluid. Adjacent to the left ovary there is an anechoic tubular structure which measures upwards a 6 cm x 3 cm. No internal echoes.  IMPRESSION: 1. Normal uterus and endometrium. 2. Normal ovaries with normal color Doppler and spectral flow. 3. A tubular fluid collection adjacent to the left adnexa. 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 Less likely a peritoneal inclusion cyst. Nonemergent MRI could differentiate.   Electronically Signed   By: Suzy Bouchard M.D.   On: 10/23/2014 20:55    Review of Systems  Constitutional: Positive for malaise/fatigue. Negative for fever and chills.  HENT: Negative.   Eyes: Negative.   Respiratory: Negative.   Cardiovascular: Negative.   Gastrointestinal: Positive for nausea, vomiting and abdominal pain.  Genitourinary: Positive for flank pain.  Negative for frequency.       + pelvic pain  Musculoskeletal: Negative.   Skin: Negative.   Neurological: Negative.   Endo/Heme/Allergies: Negative.   Psychiatric/Behavioral: Negative.    Blood pressure 158/106, pulse 64, temperature 98.7 F (37.1 C), temperature source Oral, resp. rate 20, last menstrual period 10/12/2014, SpO2 98 %, not currently breastfeeding. Physical Exam  Constitutional: She appears well-developed.  HENT:  Head: Normocephalic.  Eyes: Pupils are equal, round, and reactive to light.  Neck: Normal range of motion.  Cardiovascular: Normal rate.   Respiratory: Effort normal.  GI: Soft.  Genitourinary:  Mild left CVAT, moderate LLQ TTP  Musculoskeletal: Normal range of motion.  Skin: Skin is warm.  Psychiatric: She has a normal mood and affect. Her behavior is normal. Judgment and thought content normal.    Assessment/Plan:  1 - Left Ureteral Stone - Unclear if this or recent PID driving majority of symptoms, though I suspect stone significant component. She is not maintaining hydration and symptoms are refractory. Favor URS today if possible with goal of stone free to treat the stone but also aid in diagnosis. Do not favor SWL in setting of possible PID.   We discussed management strategies of ureteral stones including medical expulsive therapy (MET) (preferred for stones <49m diameter), ureteroscopic stone manipulation (URS), and shockwave lithotripsy (SWL) in detail including relative risks / benefits / and efficacy. We discussed that all patients are candidates for MET as long as can keep comfortable and hydrated.   After consideration of options, the patient has decided to proceed with URS.   We discussed ureteroscopic stone manipulation with basketing and laser-lithotripsy in detail.  We discussed risks including bleeding, infection, damage to kidney / ureter  bladder, rarely loss of kidney. We discussed anesthetic risks and rare but serious surgical  complications including DVT, PE, MI, and mortality. We specifically addressed that  in 5-10% of cases a staged approach is required with stenting followed by re-attempt ureteroscopy if anatomy unfavorable. The patient voiced understanding and wises to proceed.   Will place as end of day add on at [facility], [place] now. Will need [facility] transport to and from.   Delyle Weider 10/25/2014, 12:40 AM

## 2014-10-25 NOTE — Transfer of Care (Signed)
Immediate Anesthesia Transfer of Care Note  Patient: Karina [name]  Procedure(s) Performed: Procedure(s) (LRB): CYSTOSCOPY/LEFT URETEROSCOPY/HOLMIUM LASER/STONE BASKETRY/LEFT STENT PLACEMENT (Left)  Patient Location: PACU  Anesthesia Type: General  Level of Consciousness: sedated, patient cooperative and responds to stimulation  Airway & Oxygen Therapy: Patient Spontanous Breathing and Patient connected to face mask oxgen  Post-op Assessment: Report given to PACU RN and Post -op Vital signs reviewed and stable  Post vital signs: Reviewed and stable  Complications: No apparent anesthesia complications

## 2014-10-25 NOTE — Anesthesia Preprocedure Evaluation (Addendum)
Anesthesia Evaluation  Patient identified by MRN, date of birth, ID band Patient awake    Reviewed: Allergy & Precautions, NPO status , Patient's Chart, lab work & pertinent test results  History of Anesthesia Complications Negative for: history of anesthetic complications  Airway Mallampati: II  TM Distance: >3 FB Neck ROM: Full    Dental no notable dental hx. (+) Dental Advisory Given   Pulmonary former smoker,  breath sounds clear to auscultation  Pulmonary exam normal       Cardiovascular hypertension, Pt. on medications Rhythm:Regular Rate:Normal     Neuro/Psych PSYCHIATRIC DISORDERS Anxiety Depression negative neurological ROS     GI/Hepatic negative GI ROS, Neg liver ROS,   Endo/Other  obesity  Renal/GU Renal disease  negative genitourinary   Musculoskeletal negative musculoskeletal ROS (+)   Abdominal   Peds negative pediatric ROS (+)  Hematology negative hematology ROS (+)   Anesthesia Other Findings   Reproductive/Obstetrics negative OB ROS                           Anesthesia Physical Anesthesia Plan  ASA: II  Anesthesia Plan: General   Post-op Pain Management:    Induction: Intravenous, Rapid sequence and Cricoid pressure planned  Airway Management Planned: Oral ETT  Additional Equipment:   Intra-op Plan:   Post-operative Plan: Extubation in OR  Informed Consent: I have reviewed the patients History and Physical, chart, labs and discussed the procedure including the risks, benefits and alternatives for the proposed anesthesia with the patient or authorized representative who has indicated his/her understanding and acceptance.   Dental advisory given  Plan Discussed with: CRNA  Anesthesia Plan Comments:        Anesthesia Quick Evaluation

## 2014-10-25 NOTE — ED Notes (Signed)
Gave report to [place] with [facility].

## 2014-10-25 NOTE — Progress Notes (Signed)
[facility] notified for pt's return to [facility] hospital.

## 2014-10-25 NOTE — ED Notes (Signed)
ART, [facility] has spoke with [facility]. They are delayed in transfer due to an emergency call. Estimated time of arrival is one hour. Spoke with Rollen Sox, RN at [facility], updated on delay of transfer and condition of patient.

## 2014-10-25 NOTE — H&P (Signed)
Karina [name] is an 29 y.o. female presenting with 1 month of progressive left side pain. She was treated for trichomonas several weeks ago but did not complete antibiotics course. Starting yesterday her pain became severe and she developed n/v. She denies fevers.   Past Medical History  Diagnosis Date  . Fibroids   . Trichomonas   . Pregnancy   . Depression     Postpartum Depression with 1st pregnancy  . History of chlamydia   . Hypertension     gestational  . Pregnancy induced hypertension     Past Surgical History  Procedure Laterality Date  . Dilation and curettage of uterus    . Cervical cerclage      Family History  Problem Relation Age of Onset  . Hypertension Mother   . Hypertension Father     Social History:  reports that she quit smoking about 2 years ago. She has never used smokeless tobacco. She reports that she does not drink alcohol or use illicit drugs.  Allergies: No Known Allergies  Prescriptions prior to admission  Medication Sig Dispense Refill Last Dose  . doxycycline (VIBRAMYCIN) 100 MG capsule Take 1 capsule (100 mg total) by mouth 2 (two) times daily. 28 capsule 0 10/24/2014 at Unknown time  . HYDROcodone-acetaminophen (NORCO/VICODIN) 5-325 MG per tablet Take 1 tablet by mouth every 6 (six) hours as needed for moderate pain.   unknown  . ibuprofen (ADVIL,MOTRIN) 800 MG tablet Take 1 tablet (800 mg total) by mouth 3 (three) times daily. 21 tablet 0 unknown  . lisinopril-hydrochlorothiazide (PRINZIDE,ZESTORETIC) 20-25 MG per tablet Take 1 tablet by mouth daily. 30 tablet 5 10/24/2014 at Unknown time  . ondansetron (ZOFRAN) 4 MG tablet Take 1 tablet (4 mg total) by mouth every 6 (six) hours. 12 tablet 0 10/24/2014 at Unknown time  . traMADol (ULTRAM) 50 MG tablet Take 1 tablet (50 mg total) by mouth every 6 (six) hours as needed. (Patient not taking: Reported on 10/23/2014) 20 tablet 0 Completed Course at Unknown time    ROS +nausea, vomiting, pelvic, side  and back pain - fevers  Blood pressure 138/93, pulse 53, temperature 98 F (36.7 C), temperature source Oral, resp. rate 18, weight 104.333 kg (230 lb 0.2 oz), last menstrual period 10/12/2014, SpO2 100 %, not currently breastfeeding. Physical Exam  Nursing note and vitals reviewed. Constitutional: She is oriented to person, place, and time. She appears well-developed and well-nourished. No distress.  HENT:  Head: Normocephalic and atraumatic.  Eyes: Conjunctivae are normal. Right eye exhibits no discharge. Left eye exhibits no discharge. No scleral icterus.  Cardiovascular: Normal rate, regular rhythm, normal heart sounds and intact distal pulses.   No murmur heard. Respiratory: Effort normal and breath sounds normal. No respiratory distress.  GI: Soft. She exhibits no distension and no mass. There is tenderness. There is no rebound and no guarding.  Diffuse tenderness, worse in LLQ and L CVA  Neurological: She is alert and oriented to person, place, and time.  Skin: Skin is warm and dry. No rash noted. She is not diaphoretic.  Psychiatric: She has a normal mood and affect. Her behavior is normal.    Results for orders placed or performed during the hospital encounter of 10/24/14 (from the past 24 hour(s))  CBC with Differential/Platelet     Status: Abnormal   Collection Time: 10/24/14  7:05 PM  Result Value Ref Range   WBC 6.8 4.0 - 10.5 K/uL   RBC 4.68 3.87 - 5.11 MIL/uL  Hemoglobin 14.4 12.0 - 15.0 g/dL   HCT 42.7 36.0 - 46.0 %   MCV 91.2 78.0 - 100.0 fL   MCH 30.8 26.0 - 34.0 pg   MCHC 33.7 30.0 - 36.0 g/dL   RDW 12.9 11.5 - 15.5 %   Platelets 429 (H) 150 - 400 K/uL   Neutrophils Relative % 54 43 - 77 %   Neutro Abs 3.7 1.7 - 7.7 K/uL   Lymphocytes Relative 33 12 - 46 %   Lymphs Abs 2.2 0.7 - 4.0 K/uL   Monocytes Relative 12 3 - 12 %   Monocytes Absolute 0.8 0.1 - 1.0 K/uL   Eosinophils Relative 1 0 - 5 %   Eosinophils Absolute 0.1 0.0 - 0.7 K/uL   Basophils Relative 0  0 - 1 %   Basophils Absolute 0.0 0.0 - 0.1 K/uL  Comprehensive metabolic panel     Status: Abnormal   Collection Time: 10/24/14  7:05 PM  Result Value Ref Range   Sodium 139 135 - 145 mmol/L   Potassium 3.0 (L) 3.5 - 5.1 mmol/L   Chloride 102 96 - 112 mmol/L   CO2 28 19 - 32 mmol/L   Glucose, Bld 98 70 - 99 mg/dL   BUN 15 6 - 23 mg/dL   Creatinine, Ser 1.09 0.50 - 1.10 mg/dL   Calcium 9.9 8.4 - 10.5 mg/dL   Total Protein 8.1 6.0 - 8.3 g/dL   Albumin 4.5 3.5 - 5.2 g/dL   AST 17 0 - 37 U/L   ALT 10 0 - 35 U/L   Alkaline Phosphatase 83 39 - 117 U/L   Total Bilirubin 0.7 0.3 - 1.2 mg/dL   GFR calc non Af Amer 69 (L) >90 mL/min   GFR calc Af Amer 80 (L) >90 mL/min   Anion gap 9 5 - 15  Lipase, blood     Status: None   Collection Time: 10/24/14  7:05 PM  Result Value Ref Range   Lipase 28 11 - 59 U/L  Urinalysis, Routine w reflex microscopic     Status: Abnormal   Collection Time: 10/24/14 10:54 PM  Result Value Ref Range   Color, Urine AMBER (A) YELLOW   APPearance CLOUDY (A) CLEAR   Specific Gravity, Urine 1.017 1.005 - 1.030   pH 6.5 5.0 - 8.0   Glucose, UA NEGATIVE NEGATIVE mg/dL   Hgb urine dipstick LARGE (A) NEGATIVE   Bilirubin Urine NEGATIVE NEGATIVE   Ketones, ur NEGATIVE NEGATIVE mg/dL   Protein, ur NEGATIVE NEGATIVE mg/dL   Urobilinogen, UA 0.2 0.0 - 1.0 mg/dL   Nitrite NEGATIVE NEGATIVE   Leukocytes, UA SMALL (A) NEGATIVE  Urine microscopic-add on     Status: Abnormal   Collection Time: 10/24/14 10:54 PM  Result Value Ref Range   Squamous Epithelial / LPF RARE RARE   WBC, UA 3-6 <3 WBC/hpf   RBC / HPF TOO NUMEROUS TO COUNT <3 RBC/hpf   Bacteria, UA FEW (A) RARE    US Transvaginal Non-ob  10/23/2014   CLINICAL DATA:  Pelvic pain.  EXAM: TRANSABDOMINAL AND TRANSVAGINAL ULTRASOUND OF PELVIS  DOPPLER ULTRASOUND OF OVARIES  TECHNIQUE: Both transabdominal and transvaginal ultrasound examinations of the pelvis were performed. Transabdominal technique was performed  for global imaging of the pelvis including uterus, ovaries, adnexal regions, and pelvic cul-de-sac.  It was necessary to proceed with endovaginal exam following the transabdominal exam to visualize the ovaries. Color and duplex Doppler ultrasound was utilized to evaluate blood flow to  the ovaries.  COMPARISON:  Multiple OB gyn ultrasounds  FINDINGS: Uterus  Measurements: Normal in size and homogeneous echotexture measuring 8.8 x 5.7 x 6.7 cm. No fibroids or other mass visualized.  Endometrium  Thickness: Normal in thickness for premenopausal female at 8.3 mm. No focal abnormality visualized.  Right ovary  Measurements: Normal in size of small follicles measuring 3.6 x 2.2 x 2.8 cm. Normal appearance/no adnexal mass.  Left ovary  Measurements: Normal size a small follicles measuring 3.4 x 2.1 x 3.1 cm per. Normal appearance/no adnexal mass.  Pulsed Doppler evaluation of both ovaries demonstrates normal low-resistance arterial and venous waveforms.  Other findings  No free fluid. Adjacent to the left ovary there is an anechoic tubular structure which measures upwards a 6 cm x 3 cm. No internal echoes.  IMPRESSION: 1. Normal uterus and endometrium. 2. Normal ovaries with normal color Doppler and spectral flow. 3. A tubular fluid collection adjacent to the left adnexa. 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 Less likely a peritoneal inclusion cyst. Nonemergent MRI could differentiate.   Electronically Signed   By: Suzy Bouchard M.D.   On: 10/23/2014 20:55   US Pelvis Complete  10/23/2014   CLINICAL DATA:  Pelvic pain.  EXAM: TRANSABDOMINAL AND TRANSVAGINAL ULTRASOUND OF PELVIS  DOPPLER ULTRASOUND OF OVARIES  TECHNIQUE: Both transabdominal and transvaginal ultrasound examinations of the pelvis were performed. Transabdominal technique was performed for global imaging of the pelvis including uterus, ovaries, adnexal regions, and pelvic cul-de-sac.  It was necessary to proceed with endovaginal exam following the  transabdominal exam to visualize the ovaries. Color and duplex Doppler ultrasound was utilized to evaluate blood flow to the ovaries.  COMPARISON:  Multiple OB gyn ultrasounds  FINDINGS: Uterus  Measurements: Normal in size and homogeneous echotexture measuring 8.8 x 5.7 x 6.7 cm. No fibroids or other mass visualized.  Endometrium  Thickness: Normal in thickness for premenopausal female at 8.3 mm. No focal abnormality visualized.  Right ovary  Measurements: Normal in size of small follicles measuring 3.6 x 2.2 x 2.8 cm. Normal appearance/no adnexal mass.  Left ovary  Measurements: Normal size a small follicles measuring 3.4 x 2.1 x 3.1 cm per. Normal appearance/no adnexal mass.  Pulsed Doppler evaluation of both ovaries demonstrates normal low-resistance arterial and venous waveforms.  Other findings  No free fluid. Adjacent to the left ovary there is an anechoic tubular structure which measures upwards a 6 cm x 3 cm. No internal echoes.  IMPRESSION: 1. Normal uterus and endometrium. 2. Normal ovaries with normal color Doppler and spectral flow. 3. A tubular fluid collection adjacent to the left adnexa. 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 Less likely a peritoneal inclusion cyst. Nonemergent MRI could differentiate.   Electronically Signed   By: Suzy Bouchard M.D.   On: 10/23/2014 20:55   Ct Abdomen Pelvis W Contrast  10/24/2014   CLINICAL DATA:  Left-sided abdominal pain for 2 days. Recent diagnosis of pelvic inflammatory disease.  EXAM: CT ABDOMEN AND PELVIS WITH CONTRAST  TECHNIQUE: Multidetector CT imaging of the abdomen and pelvis was performed using the standard protocol following bolus administration of intravenous contrast.  CONTRAST:  73mL OMNIPAQUE IOHEXOL 300 MG/ML SOLN, 177mL OMNIPAQUE IOHEXOL 300 MG/ML SOLN  COMPARISON:  None.  FINDINGS: BODY WALL: Unremarkable.  LOWER CHEST: Unremarkable.  ABDOMEN/PELVIS:  Liver: No focal abnormality.  Biliary: No evidence of biliary obstruction or stone.   Pancreas: Unremarkable.  Spleen: Unremarkable.  Adrenals: Unremarkable.  Kidneys and ureters: 5 mm stone at the left ureteral vesicular  junction with mild hydroureter and borderline hydronephrosis. There is marked urothelial thickening throughout the left urinary collecting system, with delayed left renal enhancement. Additional punctate stone present in the left lower pole. No right-sided hydronephrosis.  Bladder: Unremarkable.  Reproductive: Fluid collection around the left ovary is again noted, unchanged since pelvic sonography 1 day prior. The adjacent ovary has a normal appearance.  Bowel: No obstruction. Normal appendix.  Retroperitoneum: No mass or adenopathy.  Peritoneum: No ascites or pneumoperitoneum.  Vascular: No acute abnormality.  OSSEOUS: No acute abnormalities.  IMPRESSION: 1. 5 mm stone at the left UVJ with prominent ureteritis and mild obstructive change. 2. Left adnexal cystic collection, reference pelvic sonography from yesterday.   Electronically Signed   By: Monte Fantasia M.D.   On: 10/24/2014 21:59   [place] Art/ven Flow Abd Pelv Doppler  10/23/2014   CLINICAL DATA:  Pelvic pain.  EXAM: TRANSABDOMINAL AND TRANSVAGINAL ULTRASOUND OF PELVIS  DOPPLER ULTRASOUND OF OVARIES  TECHNIQUE: Both transabdominal and transvaginal ultrasound examinations of the pelvis were performed. Transabdominal technique was performed for global imaging of the pelvis including uterus, ovaries, adnexal regions, and pelvic cul-de-sac.  It was necessary to proceed with endovaginal exam following the transabdominal exam to visualize the ovaries. Color and duplex Doppler ultrasound was utilized to evaluate blood flow to the ovaries.  COMPARISON:  Multiple OB gyn ultrasounds  FINDINGS: Uterus  Measurements: Normal in size and homogeneous echotexture measuring 8.8 x 5.7 x 6.7 cm. No fibroids or other mass visualized.  Endometrium  Thickness: Normal in thickness for premenopausal female at 8.3 mm. No focal abnormality  visualized.  Right ovary  Measurements: Normal in size of small follicles measuring 3.6 x 2.2 x 2.8 cm. Normal appearance/no adnexal mass.  Left ovary  Measurements: Normal size a small follicles measuring 3.4 x 2.1 x 3.1 cm per. Normal appearance/no adnexal mass.  Pulsed Doppler evaluation of both ovaries demonstrates normal low-resistance arterial and venous waveforms.  Other findings  No free fluid. Adjacent to the left ovary there is an anechoic tubular structure which measures upwards a 6 cm x 3 cm. No internal echoes.  IMPRESSION: 1. Normal uterus and endometrium. 2. Normal ovaries with normal color Doppler and spectral flow. 3. A tubular fluid collection adjacent to the left adnexa. 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 Less likely a peritoneal inclusion cyst. Nonemergent MRI could differentiate.   Electronically Signed   By: Suzy Bouchard M.D.   On: 10/23/2014 20:55    Assessment/Plan: 29yo female p/w severe left side pain secondary to PID and nephrolithiasis  # PID: Partially treated trichomonas earlier this month. L hydrosalpinx on ultrasound.  - Continue cefoxitin and doxycycline - 2g flagyl when able to tolerate PO - Monitor for fever/WBC count - Toradol and dilaudid for pain control - zofran for nausea/vomiting  # Nephrolithiasis: 56mm stone at left UPJ with prominent ureteritis on CT - urology consulting, will take to OR later today for ureteroscopic stone manipulation - Add on ~5pm, NPO after 6am  PPX: lovenox FENGI: NPO at 6am for surgery then advance as tolerated, MIVF@100mL /hr   Beverlyn Roux 10/25/2014, 6:08 AM   Pt seen and examined.  Agree with above.   IV meds only until not vomiting. Can d/c when tolerating po, hopefully tomorrow. Jacora Hopkins H. 6:47 AM

## 2014-10-26 LAB — CBC
HCT: 41 % (ref 36.0–46.0)
Hemoglobin: 14.6 g/dL (ref 12.0–15.0)
MCH: 31.5 pg (ref 26.0–34.0)
MCHC: 35.6 g/dL (ref 30.0–36.0)
MCV: 88.6 fL (ref 78.0–100.0)
Platelets: 364 10*3/uL (ref 150–400)
RBC: 4.63 MIL/uL (ref 3.87–5.11)
RDW: 12.6 % (ref 11.5–15.5)
WBC: 9.2 10*3/uL (ref 4.0–10.5)

## 2014-10-26 LAB — BASIC METABOLIC PANEL
ANION GAP: 7 (ref 5–15)
BUN: 13 mg/dL (ref 6–23)
CHLORIDE: 102 mmol/L (ref 96–112)
CO2: 24 mmol/L (ref 19–32)
CREATININE: 0.88 mg/dL (ref 0.50–1.10)
Calcium: 9.3 mg/dL (ref 8.4–10.5)
GFR calc non Af Amer: 90 mL/min — ABNORMAL LOW (ref >90)
Glucose, Bld: 143 mg/dL — ABNORMAL HIGH (ref 70–99)
POTASSIUM: 3.7 mmol/L (ref 3.5–5.1)
SODIUM: 133 mmol/L — AB (ref 135–145)

## 2014-10-26 LAB — URINE CULTURE
Colony Count: NO GROWTH
Culture: NO GROWTH

## 2014-10-26 MED ORDER — DOXYCYCLINE HYCLATE 100 MG PO TABS
100.0000 mg | ORAL_TABLET | Freq: Two times a day (BID) | ORAL | Status: DC
  Administered 2014-10-26 – 2014-10-27 (×3): 100 mg via ORAL
  Filled 2014-10-26 (×5): qty 1

## 2014-10-26 MED ORDER — HYDROCHLOROTHIAZIDE 25 MG PO TABS
25.0000 mg | ORAL_TABLET | Freq: Once | ORAL | Status: AC
  Administered 2014-10-26: 25 mg via ORAL
  Filled 2014-10-26: qty 1

## 2014-10-26 MED ORDER — ONDANSETRON HCL 4 MG/2ML IJ SOLN
4.0000 mg | Freq: Four times a day (QID) | INTRAMUSCULAR | Status: DC | PRN
  Administered 2014-10-26 (×2): 4 mg via INTRAVENOUS
  Filled 2014-10-26 (×2): qty 2

## 2014-10-26 MED ORDER — OXYBUTYNIN CHLORIDE 5 MG PO TABS
5.0000 mg | ORAL_TABLET | Freq: Three times a day (TID) | ORAL | Status: DC | PRN
  Administered 2014-10-26 (×2): 5 mg via ORAL
  Filled 2014-10-26 (×3): qty 1

## 2014-10-26 NOTE — Progress Notes (Signed)
1 Day Post-Op  Subjective:   1 - Left Ureteral Stone - s/p left ureteroscopic stone manipulation with laser lithotripsy 10/25/14 for 12mm left UVJ stone found on fractory pelvic pain and nausea with emesis.   PMH sig for recurrent STD.   Today "Aalia" is seen in f/u above. Having some persistent pelvic pain and some gross hematuria as expected. No fevers.   Objective: Vital signs in last 24 hours: Temp:  [97.5 F (36.4 C)-99.1 F (37.3 C)] 98.5 F (36.9 C) (03/01 1400) Pulse Rate:  [58-84] 68 (03/01 1400) Resp:  [12-20] 18 (03/01 1400) BP: (139-174)/(89-128) 144/104 mmHg (03/01 1400) SpO2:  [100 %] 100 % (03/01 1400) Last BM Date: 10/23/14  Intake/Output from previous day: 02/29 0701 - 03/01 0700 In: 1564.6 [I.V.:1214.6; IV Piggyback:350] Out: 3500 [Urine:3500] Intake/Output this shift: Total I/O In: 1177.9 [P.O.:180; I.V.:947.9; IV Piggyback:50] Out: 1100 [Urine:1100]  General appearance: alert, cooperative and appears stated age Eyes: negative Neck: supple, symmetrical, trachea midline Back: symmetric, no curvature. ROM normal. No CVA tenderness. Resp: non-labored on room air Cardio: Nl rate GI: non tender upper abd Pelvic: lower pelvis from midline to left still with significant TTP. NO rebound.  Pulses: 2+ and symmetric Skin: Skin color, texture, turgor normal. No rashes or lesions Lymph nodes: Cervical, supraclavicular, and axillary nodes normal. Neurologic: Grossly normal  Lab Results:   Recent Labs  10/25/14 0545 10/26/14 0524  WBC 7.1 9.2  HGB 13.5 14.6  HCT 39.2 41.0  PLT 344 364   BMET  Recent Labs  10/24/14 1905 10/25/14 0545 10/26/14 0524  NA 139  --  133*  K 3.0*  --  3.7  CL 102  --  102  CO2 28  --  24  GLUCOSE 98  --  143*  BUN 15  --  13  CREATININE 1.09 0.91 0.88  CALCIUM 9.9  --  9.3   PT/INR No results for input(s): LABPROT, INR in the last 72 hours. ABG No results for input(s): PHART, HCO3 in the last 72 hours.  Invalid  input(s): PCO2, PO2  Studies/Results: Ct Abdomen Pelvis W Contrast  10/24/2014   CLINICAL DATA:  Left-sided abdominal pain for 2 days. Recent diagnosis of pelvic inflammatory disease.  EXAM: CT ABDOMEN AND PELVIS WITH CONTRAST  TECHNIQUE: Multidetector CT imaging of the abdomen and pelvis was performed using the standard protocol following bolus administration of intravenous contrast.  CONTRAST:  47mL OMNIPAQUE IOHEXOL 300 MG/ML SOLN, 195mL OMNIPAQUE IOHEXOL 300 MG/ML SOLN  COMPARISON:  None.  FINDINGS: BODY WALL: Unremarkable.  LOWER CHEST: Unremarkable.  ABDOMEN/PELVIS:  Liver: No focal abnormality.  Biliary: No evidence of biliary obstruction or stone.  Pancreas: Unremarkable.  Spleen: Unremarkable.  Adrenals: Unremarkable.  Kidneys and ureters: 5 mm stone at the left ureteral vesicular junction with mild hydroureter and borderline hydronephrosis. There is marked urothelial thickening throughout the left urinary collecting system, with delayed left renal enhancement. Additional punctate stone present in the left lower pole. No right-sided hydronephrosis.  Bladder: Unremarkable.  Reproductive: Fluid collection around the left ovary is again noted, unchanged since pelvic sonography 1 day prior. The adjacent ovary has a normal appearance.  Bowel: No obstruction. Normal appendix.  Retroperitoneum: No mass or adenopathy.  Peritoneum: No ascites or pneumoperitoneum.  Vascular: No acute abnormality.  OSSEOUS: No acute abnormalities.  IMPRESSION: 1. 5 mm stone at the left UVJ with prominent ureteritis and mild obstructive change. 2. Left adnexal cystic collection, reference pelvic sonography from yesterday.   Electronically Signed  By: Monte Fantasia M.D.   On: 10/24/2014 21:59    Anti-infectives: Anti-infectives    Start     Dose/Rate Route Frequency Ordered Stop   10/26/14 1000  doxycycline (VIBRA-TABS) tablet 100 mg     100 mg Oral Every 12 hours 10/26/14 0937     10/25/14 1000  doxycycline (VIBRAMYCIN)  100 mg in dextrose 5 % 250 mL IVPB  Status:  Discontinued     100 mg 125 mL/hr over 120 Minutes Intravenous Every 12 hours 10/25/14 0535 10/26/14 0937   10/25/14 0600  cefOXitin (MEFOXIN) 2 g in dextrose 5 % 50 mL IVPB     2 g 100 mL/hr over 30 Minutes Intravenous 4 times per day 10/25/14 0535     10/24/14 2300  doxycycline (VIBRAMYCIN) 100 mg in dextrose 5 % 250 mL IVPB     100 mg 125 mL/hr over 120 Minutes Intravenous  Once 10/24/14 2250 10/25/14 0229   10/24/14 2300  cefOXitin (MEFOXIN) 2 g in dextrose 5 % 50 mL IVPB     2 g 100 mL/hr over 30 Minutes Intravenous  Once 10/24/14 2251 10/24/14 2355      Assessment/Plan:   1 - Left Ureteral Stone - Doing as expected POD 1 from ureteroscopic stone manipulation. Pt to removed tethered stent on Friday morning which she can do at home and was instructed on this today. She is fine for DC from Uro perspective at any point. We will arrange her Uro follow up. Explained normal to have persistent gross hematuria until few days after removal.  2 - will follow prn at this point, please call with questions anytime.  [facility], Ulas Zuercher 10/26/2014

## 2014-10-26 NOTE — Progress Notes (Addendum)
Subjective: Patient reports persistent left flank pain and abdominal pain. She is still without an appetite and reports some nausea.     Objective: I have reviewed patient's vital signs and medications.  General: alert, cooperative and no distress GI: soft, lower abdominal ternderness and left sided tenderness, no rebound, no guarding Extremities: extremities normal, atraumatic, no cyanosis or edema   Assessment/Plan: 29 yo admitted with PID POD#1 s/p left ureteral stone removal and stent placement Continue IV antibiotics Continue pain management Encourage po intake   LOS: 1 day    Karina [name] 10/26/2014, 6:41 AM

## 2014-10-26 NOTE — Op Note (Signed)
Karina [name], Karina [name]              ACCOUNT NO.:  [id number]  MEDICAL RECORD NO.:  [id number]  LOCATION:  2956                          FACILITY:  [facility]  PHYSICIAN:  Alexis Frock, MD     DATE OF BIRTH:  05-14-1986  DATE OF PROCEDURE:  10/25/2014                                OPERATIVE REPORT   DIAGNOSES:  Left ureteral stone, pelvic pain, refractory nausea and vomiting.  PROCEDURES: 1. Cystoscopy with left retrograde pyelogram interpretation. 2. Left ureteroscopy with laser lithotripsy. 3. Insertion of left ureteral stent, 5 x 24 Polaris with tether to the     thigh.  FINDINGS: 1. Significantly impacted left distal ureteral stone.  Mild proximal     hydroureteronephrosis. 2. Complete resolution of all stone fragments larger than 1/3rd mm     following stone fragmentation with lithotripsy and basket retrieval     throughout the entire length of the left ureter. 3. Significant mucosal edema at the site of prior stone impaction.  It     was felt that interval ureteral stenting would be warranted. 4. Unremarkable urinary bladder.  INDICATIONS:  Karina [name] is a pleasant 29 year old young lady with an approximately 1 month prodrome of pelvic pain, nausea, and some left radiating groin and flank pain, and history of recurrent trichomonas, who presented to the emergency room late yesterday evening with acutely worsened left pelvic pain, cervical motion tenderness.  This was worrisome for possible acute pelvic inflammatory disease.  Axial imaging did reveal a fluid within her left fallpian tube, but also a left distal ureteral stone with mild hydroureteronephrosis.  It was unclear whether possible pelvic inflammatory disease versus ureteral stone was the prominent source of her symptoms.  She had no infectious parameters on her urine and remained afebrile.  Options were discussed for management of ureteral stone including medical expulsive therapy versus shockwave lithotripsy versus  ureteroscopy with laser lithotripsy.  The latter being the most definitive and shockwave lithotripsy somewhat being not preferred in the setting of pelvic inflammatory disease, and she did wish to proceed with ureteroscopic stone manipulation in a short term. Informed consent was signed and placed on medical record.  DESCRIPTION OF PROCEDURE:  Patient being Karina [name] verified, procedure being left ureteroscopic stone manipulation was confirmed, procedure was carried out, time-out was performed.  Intravenous antibiotics administered.  General endotracheal anesthesia was introduced.  The patient was placed into a low lithotomy position. Sterile field was created by prepping and draping the patient's vagina, introitus, and proximal thighs using iodine x3.  Next, cystourethroscopy was performed using a 22-French rigid cystoscope with 30 offset lens. Inspection of the urinary bladder revealed no diverticula, calcifications, papular lesions.  The left ureteral orifice cannulated with a 6-French end-hole catheter and left retrograde pyelogram was obtained.  Left retrograde pyelogram demonstrated a single left ureter, single system left kidney.  There is very mild hydroureteronephrosis to a filling defect in the distal greater stone.  A 0.038 ZIPwire was advanced at the level of the upper pole and set aside as a safety wire. An 8-French feeding tube was placed in the urinary bladder for pressure release.  Next, semi-rigid ureteroscopy performed in the distal left ureter alongside  as a separate Sensor working wire as expected. Dominant calcification was encountered in the distal ureter.  It appeared to be quite impacted with significant erythema and mucosal edema and this did not appear amenable to simple basketing.  As such, holmium laser energy was applied to the stone using a 200 nanometer fiber in the settings of 0.2 joules and 10 hertz.  The stone was fragmented in approximately 3 to  4 smaller pieces.  These were then each grasped with an escape-type basket, brought out in their entirety, set aside for compositional analysis.  Repeat ureteroscopic visualization of the entire left ureter from the bladder to the kidney revealed complete resolution of all stone fragments larger than 1/3rd mm.  There was some significant mucosal edema and even slight mucosal disruption from the area of impaction of her prior stent.  It was exclusively felt that ureteral stenting would be warranted to prevent ureteral edema and allow ureteral healing in this area.  Further documentation was performed.  As such, a new 5 x 24 Polaris-type stent was placed over the remaining safety wire using fluoroscopic guidance.  Good proximal and distal deployment were noted.  Tether was left in place and fashioned with a thigh.  Procedure was terminated.  The patient tolerated the procedure well.  There were no immediate periprocedural complications.  The patient was taken to the postanesthesia care unit in stable condition.          ______________________________ Alexis Frock, MD     TM/MEDQ  D:  10/25/2014  T:  10/26/2014  Job:  [id number]

## 2014-10-27 MED ORDER — HYDROCODONE-ACETAMINOPHEN 5-325 MG PO TABS: 1.0000 | ORAL_TABLET | Freq: Four times a day (QID) | ORAL | Status: DC | PRN

## 2014-10-27 MED ORDER — LEVOFLOXACIN 500 MG PO TABS: 500.0000 mg | ORAL_TABLET | Freq: Every day | ORAL | Status: DC

## 2014-10-27 MED ORDER — METRONIDAZOLE 500 MG PO TABS: 500.0000 mg | ORAL_TABLET | Freq: Two times a day (BID) | ORAL | Status: DC

## 2014-10-27 NOTE — Discharge Summary (Signed)
Physician Discharge Summary  Patient ID: Karina [name] MRN: [id number] DOB/AGE: [birth year] 27 y.o.  Admit date: 10/24/2014 Discharge date: 10/27/2014  Admission Diagnoses:  1.  PID  2.  Nephrolithiasis Discharge Diagnoses:  Active Problems:   Pelvic inflammatory disease (PID)   Nephrolithiasis   Discharged Condition: stable  Hospital Course: Patient admitted with PID and left nephrolithiasis.  She was started on Cefoxitin and doxycycline for the PID.  For resolution of the nephrolithiasis, the patient was taken to the OR by urology and had a laser lithotripsy with stenting of the ureter.  She continued to have intense abdominal pain and nausea POD#1.  She was improved today and is stable for discharge.  Will discharge patient on antibiotics for 10 days.  Consults: urology  Significant Diagnostic Studies: CT showing left nephrolithiasis and left hydrosalpinx.  Treatments: antibiotics: cefoxitin and doxycycline  Discharge Exam: Blood pressure 124/90, pulse 51, temperature 97.7 F (36.5 C), temperature source Oral, resp. rate 18, height 5\' 5"  (1.651 m), weight 215 lb (97.523 kg), last menstrual period 10/12/2014, SpO2 100 %, not currently breastfeeding. General appearance: alert, cooperative and no distress Resp: clear to auscultation bilaterally Cardio: regular rate and rhythm, S1, S2 normal, no murmur, click, rub or gallop GI: soft, active bowel sounds.  Mild tenderness left lower quadrant.  No rebound tenderness. Extremities: extremities normal, atraumatic, no cyanosis or edema  Disposition: 01-Home or Self Care     Medication List    STOP taking these medications        doxycycline 100 MG capsule  Commonly known as:  VIBRAMYCIN     traMADol 50 MG tablet  Commonly known as:  ULTRAM      TAKE these medications        HYDROcodone-acetaminophen 5-325 MG per tablet  Commonly known as:  NORCO/VICODIN  Take 1-2 tablets by mouth every 6 (six) hours as needed for moderate  pain.     ibuprofen 800 MG tablet  Commonly known as:  ADVIL,MOTRIN  Take 1 tablet (800 mg total) by mouth 3 (three) times daily.     levofloxacin 500 MG tablet  Commonly known as:  LEVAQUIN  Take 1 tablet (500 mg total) by mouth daily.     lisinopril-hydrochlorothiazide 20-25 MG per tablet  Commonly known as:  PRINZIDE,ZESTORETIC  Take 1 tablet by mouth daily.     metroNIDAZOLE 500 MG tablet  Commonly known as:  FLAGYL  Take 1 tablet (500 mg total) by mouth 2 (two) times daily.     ondansetron 4 MG tablet  Commonly known as:  ZOFRAN  Take 1 tablet (4 mg total) by mouth every 6 (six) hours.           Follow-up Information    Follow up with Alexis Frock, MD.   Specialty:  Urology   Why:  We will contact you to arrange follow up visit within 4 weeks. Feel free to call the office as well to schedule.    Contact information:   [place] [address] [phone]       Follow up with [facility] In 2 weeks.   Contact information:   [address] [place] [address] [phone]      Signed: Loma Boston JEHIEL 10/27/2014, 7:28 AM

## 2014-10-27 NOTE — Discharge Instructions (Signed)
1 - You may have urinary urgency (bladder spasms) and bloody urine on / off with stent in place. This is normal.  2 - Call MD or go to ER for fever >102, severe pain / nausea / vomiting not relieved by medications, or acute change in medical status  3 - Remove tethered stent on Friday morning by pulling on string, then blue/white plastic tubing, and discarding.

## 2014-10-27 NOTE — Progress Notes (Signed)
Pt is discharged in the care of [place] [facility]. Denies any pain or discomfort. Spirits are good Abdominal inciisions are clean and dry. States she understands all instructions well. Questions were asked and answered. No equipment needed  for home use.Denies bleeding.

## 2014-10-27 NOTE — Progress Notes (Signed)
Pt. Had stent removed  Prior to discharge. Stated while using the restroom string pulled 1/2 out. DR. Notified of same.Orders were received and carried. No vaginal  Bleeding noted.

## 2014-10-28 ENCOUNTER — Encounter: Payer: Self-pay | Admitting: Advanced Practice Midwife

## 2014-11-01 ENCOUNTER — Encounter (HOSPITAL_COMMUNITY): Payer: Self-pay | Admitting: Emergency Medicine

## 2014-11-01 ENCOUNTER — Telehealth: Payer: Self-pay | Admitting: *Deleted

## 2014-11-01 ENCOUNTER — Emergency Department (HOSPITAL_COMMUNITY)
Admission: EM | Admit: 2014-11-01 | Discharge: 2014-11-01 | Disposition: A | Discharge status: Home / Self Care | Payer: Medicaid Other | Source: Home / Self Care | Attending: Emergency Medicine | Admitting: Emergency Medicine

## 2014-11-01 DIAGNOSIS — B372 Candidiasis of skin and nail: Secondary | ICD-10-CM

## 2014-11-01 MED ORDER — HYDROCORTISONE 1 % EX CREA: TOPICAL_CREAM | CUTANEOUS | Status: DC

## 2014-11-01 MED ORDER — FLUCONAZOLE 200 MG PO TABS: 200.0000 mg | ORAL_TABLET | Freq: Every day | ORAL | Status: AC

## 2014-11-01 MED ORDER — NYSTATIN 100000 UNIT/GM EX CREA: TOPICAL_CREAM | CUTANEOUS | Status: DC

## 2014-11-01 NOTE — Discharge Instructions (Signed)
Cutaneous Candidiasis Cutaneous candidiasis is a condition in which there is an overgrowth of yeast (candida) on the skin. Yeast normally live on the skin, but in small enough numbers not to cause any symptoms. In certain cases, increased growth of the yeast may cause an actual yeast infection. This kind of infection usually occurs in areas of the skin that are constantly warm and moist, such as the armpits or the groin. Yeast is the most common cause of diaper rash in babies and in people who cannot control their bowel movements (incontinence). CAUSES  The fungus that most often causes cutaneous candidiasis is Candida albicans. Conditions that can increase the risk of getting a yeast infection of the skin include:  Obesity.  Pregnancy.  Diabetes.  Taking antibiotic medicine.  Taking birth control pills.  Taking steroid medicines.  Thyroid disease.  An iron or zinc deficiency.  Problems with the immune system. SYMPTOMS   Red, swollen area of the skin.  Bumps on the skin.  Itchiness. DIAGNOSIS  The diagnosis of cutaneous candidiasis is usually based on its appearance. Light scrapings of the skin may also be taken and viewed under a microscope to identify the presence of yeast. TREATMENT  Antifungal creams may be applied to the infected skin. In severe cases, oral medicines may be needed.  HOME CARE INSTRUCTIONS   Keep your skin clean and dry.  Maintain a healthy weight.  If you have diabetes, keep your blood sugar under control. SEEK IMMEDIATE MEDICAL CARE IF:  Your rash continues to spread despite treatment.  You have a fever, chills, or abdominal pain. Document Released: 05/01/2011 Document Revised: 11/05/2011 Document Reviewed: 05/01/2011 ExitCare Patient Information 2015 [employer]. This information is not intended to replace advice given to you by your health care provider. Make sure you discuss any questions you have with your health care provider.  

## 2014-11-01 NOTE — ED Provider Notes (Signed)
   Chief Complaint    Rash   History of Present Illness      Karina [name] is a 29 year old female who's had a 3 to four-day history of a itchy rash in the intertriginous areas in both groins extending to the anus. She cannot think of anything that is coming contact with this area. She's been on Flagyl and Levaquin for PID and a kidney stone. She denies any rash elsewhere on her body. She's had no difficulty breathing, fevers, or chills.  Review of Systems   Other than as noted above, the patient denies any of the following symptoms: Systemic:  No fever or chills. ENT:  No nasal congestion, rhinorrhea, sore throat, swelling of lips, tongue or throat. Resp:  No cough, wheezing, or shortness of breath.  [place]    Past medical history, family history, social history, meds, and allergies were reviewed. She also has high blood pressure and is on lisinopril/hydrochlorothiazide.  Physical Exam     Vital signs:  BP 142/113 mmHg  Pulse 67  Temp(Src) 98.2 F (36.8 C) (Oral)  Resp 16  SpO2 100%  LMP 10/12/2014 Gen:  Alert, oriented, in no distress. ENT:  Pharynx clear, no intraoral lesions, moist mucous membranes. Lungs:  Clear to auscultation. Skin:  Exam of the genital area reveals a bumpy, erythematous rash in both groin areas extending towards the anus. There was no weeping or oozing.  Assessment    The encounter diagnosis was Candidal intertrigo.  Plan     1.  Meds:  The following meds were prescribed:   New Prescriptions   FLUCONAZOLE (DIFLUCAN) 200 MG TABLET    Take 1 tablet (200 mg total) by mouth daily.   HYDROCORTISONE CREAM 1 %    Apply to affected area 3 times daily   NYSTATIN CREAM (MYCOSTATIN)    Apply to affected area TID    2.  Patient Education/Counseling:  The patient was given appropriate handouts, self care instructions, and instructed in symptomatic relief.  Suggested washing the area with just plain warm water on a white washcloth, patting dry and using a  blow dryer to keep as dry as possible.  3.  Follow up:  The patient was told to follow up here if no better in 3 to 4 days, or sooner if becoming worse in any way, and given some red flag symptoms such as worsening rash, fever, or difficulty breathing which would prompt immediate return.  Follow up here if necessary.      Harden Mo, MD 11/01/14 [phone]

## 2014-11-01 NOTE — ED Notes (Signed)
Patient c/o rash in her pelvic area. Patient reports it is red and raised, itchy and irritated and sometimes painful. Patient is in NAD. Denies respiratory distress.

## 2014-11-01 NOTE — Telephone Encounter (Signed)
Patient called and stated that she has a Akeila Lana between her legs. I offered to speak with a provider to see if there is anything we can do over the phone and patient stated that she needed to have it looked at today. Advised patient that we do not have any appointments available today and that we can schedule her next available. Patient then stated that she will just go to the ER or urgent care for evaluation today.

## 2014-11-08 ENCOUNTER — Telehealth: Payer: Self-pay | Admitting: *Deleted

## 2014-11-08 NOTE — Telephone Encounter (Signed)
Patient states that she got sick from her medication that shes been taking. Wants to know what this means.

## 2014-11-08 NOTE — Telephone Encounter (Signed)
Called patient and she states that she took some nausea medication she had and feels better now. Patient had no questions

## 2014-11-22 ENCOUNTER — Ambulatory Visit (INDEPENDENT_AMBULATORY_CARE_PROVIDER_SITE_OTHER): Payer: Medicaid Other | Admitting: Family Medicine

## 2014-11-22 ENCOUNTER — Encounter: Payer: Self-pay | Admitting: Family Medicine

## 2014-11-22 VITALS — BP 140/99 | HR 66 | Temp 98.0°F | Ht 65.0 in | Wt 224.5 lb

## 2014-11-22 DIAGNOSIS — N739 Female pelvic inflammatory disease, unspecified: Secondary | ICD-10-CM

## 2014-11-22 DIAGNOSIS — I10 Essential (primary) hypertension: Secondary | ICD-10-CM | POA: Insufficient documentation

## 2014-11-22 MED ORDER — LISINOPRIL-HYDROCHLOROTHIAZIDE 20-25 MG PO TABS: 1.0000 | ORAL_TABLET | Freq: Every day | ORAL | Status: DC

## 2014-11-22 NOTE — Progress Notes (Signed)
Patient ID: Karina [name], female   DOB: 09/07/1985, 29 y.o.   MRN: [id number] Pt states the last time she took HCTZ was Friday, March 25.  Pt states she recently moved here and does not have a primary physician.

## 2014-11-22 NOTE — Progress Notes (Signed)
   Subjective:    Patient ID: Karina [name], female    DOB: 1986-03-23, 29 y.o.   MRN: [id number]  HPI Patient seen for follow up of hospitalization from 2/29 until 3/2 for PID and nephrolithiasis.  The patient had uretal stent following laser lithotripsy - stent removed prior to discharge.  Pelvic pain and discharge resolved.  Would like to be checked again to assure that infection completely resolved.   Review of Systems  Constitutional: Negative for fever, chills and fatigue.  Gastrointestinal: Negative for nausea, vomiting, abdominal pain and diarrhea.  Genitourinary: Negative for dysuria, urgency, hematuria, vaginal bleeding, vaginal discharge, vaginal pain, pelvic pain and dyspareunia.       Objective:   Physical Exam  Constitutional: She is oriented to person, place, and time. She appears well-developed and well-nourished.  Abdominal: Soft. Bowel sounds are normal.  Genitourinary: There is no rash, tenderness, lesion or injury on the right labia. There is no rash, tenderness, lesion or injury on the left labia. No tenderness in the vagina. Vaginal discharge (physiologic) found.  Neurological: She is alert and oriented to person, place, and time.  Skin: Skin is warm and dry.  Psychiatric: She has a normal mood and affect. Her behavior is normal. Judgment and thought content normal.      Assessment & Plan:   Problem List Items Addressed This Visit    Pelvic inflammatory disease (PID) - Primary   Hypertension   Relevant Medications   lisinopril-hydrochlorothiazide (PRINZIDE,ZESTORETIC) 20-25 MG per tablet     Wet prep obtained.  F/u as needed.   Refill her BP medication. Encouraged her to find PCP.

## 2014-11-23 LAB — WET PREP, GENITAL: TRICH WET PREP: NONE SEEN

## 2014-11-30 ENCOUNTER — Telehealth: Payer: Self-pay

## 2014-11-30 DIAGNOSIS — N760 Acute vaginitis: Principal | ICD-10-CM

## 2014-11-30 DIAGNOSIS — B379 Candidiasis, unspecified: Secondary | ICD-10-CM

## 2014-11-30 DIAGNOSIS — B9689 Other specified bacterial agents as the cause of diseases classified elsewhere: Secondary | ICD-10-CM

## 2014-11-30 MED ORDER — METRONIDAZOLE 500 MG PO TABS: 500.0000 mg | ORAL_TABLET | Freq: Two times a day (BID) | ORAL | Status: DC

## 2014-11-30 MED ORDER — FLUCONAZOLE 150 MG PO TABS: 150.0000 mg | ORAL_TABLET | Freq: Once | ORAL | Status: DC

## 2014-11-30 NOTE — Telephone Encounter (Addendum)
Wet prep shows yeast and BV. Diflucan 150mg  X 1 dose and flagyl 500mg  BID X 7 days e-prescribed per protocol. Attempted to contact patient. No answer. Left message stating we care calling with results, please call clinic and state whether or not it is OK to leave detailed message on voicemail.

## 2014-11-30 NOTE — Telephone Encounter (Signed)
Pt contacted the clinic, results given and prescription management.  Pt verbalizes understanding and has no further questions.

## 2014-12-06 ENCOUNTER — Encounter: Payer: Self-pay | Admitting: *Deleted

## 2014-12-07 ENCOUNTER — Telehealth: Payer: Self-pay

## 2014-12-07 NOTE — Telephone Encounter (Signed)
Pt called and stated that she received her records from our facility but it was missing care provider information for her insurance.  She stated that her insurance faxed over a request x 2 and has not received notification from the Clinics.  Could someone give her a call.

## 2014-12-14 ENCOUNTER — Telehealth: Payer: Self-pay

## 2014-12-14 NOTE — Telephone Encounter (Signed)
Pt called and stated that she filled out ROI for her insurance to get information.  She stated that her insurance has faxed over a letter in which we have not responded too.  If we could respond to the letter and fax it to [phone].  Called pt and pt asked if we had received the faxed letter.  I informed pt that we did and that I will complete the request for her by tomorrow at 5pm and that I will call her when I complete it.  Pt stated "thank you" with no further questions.

## 2014-12-14 NOTE — Telephone Encounter (Signed)
Called pt and LM that we are returning her call if she continue to have questions or concerns to please give [place] a call back.

## 2014-12-15 NOTE — Progress Notes (Signed)
The fax that was sent via pts insurance requests records from the time of October 2015 to Janaury 2016 in which pt was not seen until after January 2016.  Called [phone] and spoke with [place], [place],  concerning the dates.  She informed me that the request is to see if patient had any pre-existing conditions so to check "no" on the form.  I agreed.  Form faxed to [employer] as requested.  Called pt and informed her that information was faxed as she had requested.  Pt stated "thank you" with no further questions.

## 2015-02-22 ENCOUNTER — Emergency Department (HOSPITAL_COMMUNITY)
Admission: EM | Admit: 2015-02-22 | Discharge: 2015-02-22 | Disposition: A | Discharge status: Home / Self Care | Payer: Medicaid Other | Attending: Emergency Medicine | Admitting: Emergency Medicine

## 2015-02-22 ENCOUNTER — Encounter (HOSPITAL_COMMUNITY): Payer: Self-pay | Admitting: Emergency Medicine

## 2015-02-22 DIAGNOSIS — Z8659 Personal history of other mental and behavioral disorders: Secondary | ICD-10-CM | POA: Insufficient documentation

## 2015-02-22 DIAGNOSIS — Z792 Long term (current) use of antibiotics: Secondary | ICD-10-CM | POA: Diagnosis not present

## 2015-02-22 DIAGNOSIS — Z8619 Personal history of other infectious and parasitic diseases: Secondary | ICD-10-CM | POA: Diagnosis not present

## 2015-02-22 DIAGNOSIS — Z3202 Encounter for pregnancy test, result negative: Secondary | ICD-10-CM | POA: Diagnosis not present

## 2015-02-22 DIAGNOSIS — Z87891 Personal history of nicotine dependence: Secondary | ICD-10-CM | POA: Insufficient documentation

## 2015-02-22 DIAGNOSIS — N898 Other specified noninflammatory disorders of vagina: Secondary | ICD-10-CM | POA: Diagnosis not present

## 2015-02-22 DIAGNOSIS — Z8742 Personal history of other diseases of the female genital tract: Secondary | ICD-10-CM | POA: Insufficient documentation

## 2015-02-22 DIAGNOSIS — R102 Pelvic and perineal pain: Secondary | ICD-10-CM | POA: Diagnosis not present

## 2015-02-22 DIAGNOSIS — R21 Rash and other nonspecific skin eruption: Secondary | ICD-10-CM | POA: Diagnosis present

## 2015-02-22 LAB — PREGNANCY, URINE: Preg Test, Ur: NEGATIVE

## 2015-02-22 LAB — WET PREP, GENITAL
CLUE CELLS WET PREP: NONE SEEN
Trich, Wet Prep: NONE SEEN
Yeast Wet Prep HPF POC: NONE SEEN

## 2015-02-22 NOTE — ED Provider Notes (Signed)
CSN: [id number]     Arrival date & time 02/22/15  0745 History   First MD Initiated Contact with Patient 02/22/15 [phone]     Chief Complaint  Patient presents with  . vaginal rash       HPI  She presents for evaluation of some pain in her vagina. She states she has a sore area inferior and to the left lateral of her introitus. She states she is concerned because it started after intercourse with a condom on Saturday. Was concerned she might be having a reaction to the condom. His never had a reaction before. No history of herpes. No history of rash or other sores or lesions. Has had chlamydia and trichomonas in the past. States "I get vaginosis a lot".  Past Medical History  Diagnosis Date  . Fibroids   . Trichomonas   . Pregnancy   . Depression     Postpartum Depression with 1st pregnancy  . History of chlamydia   . Hypertension     gestational  . Pregnancy induced hypertension    Past Surgical History  Procedure Laterality Date  . Dilation and curettage of uterus    . Cervical cerclage     Family History  Problem Relation Age of Onset  . Hypertension Mother   . Hypertension Father    History  Substance Use Topics  . Smoking status: Former Smoker    Quit date: 08/12/2012  . Smokeless tobacco: Never Used  . Alcohol Use: No   OB History    Gravida Para Term Preterm AB TAB SAB Ectopic Multiple Living   6 2 1 1 3  0 3 0 0 2     Review of Systems  Constitutional: Negative for fever, chills, diaphoresis, appetite change and fatigue.  HENT: Negative for mouth sores, sore throat and trouble swallowing.   Eyes: Negative for visual disturbance.  Respiratory: Negative for cough, chest tightness, shortness of breath and wheezing.   Cardiovascular: Negative for chest pain.  Gastrointestinal: Negative for nausea, vomiting, abdominal pain, diarrhea and abdominal distention.  Endocrine: Negative for polydipsia, polyphagia and polyuria.  Genitourinary: Positive for genital sores.  Negative for dysuria, frequency and hematuria.  Musculoskeletal: Negative for gait problem.  Skin: Negative for color change, pallor and rash.  Neurological: Negative for dizziness, syncope, light-headedness and headaches.  Hematological: Does not bruise/bleed easily.  Psychiatric/Behavioral: Negative for behavioral problems and confusion.      Allergies  Review of patient's allergies indicates no known allergies.  Home Medications   Prior to Admission medications   Medication Sig Start Date End Date Taking? Authorizing Provider  lisinopril-hydrochlorothiazide (PRINZIDE,ZESTORETIC) 20-25 MG per tablet Take 1 tablet by mouth daily. 11/22/14  Yes Tanna Savoy Stinson, DO  metroNIDAZOLE (FLAGYL) 500 MG tablet Take 1 tablet (500 mg total) by mouth 2 (two) times daily. 11/30/14  Yes Deirdre C Poe, CNM  fluconazole (DIFLUCAN) 150 MG tablet Take 1 tablet (150 mg total) by mouth once. Patient not taking: Reported on 02/22/2015 11/30/14   Deirdre C Poe, CNM   BP 145/107 mmHg  Pulse 68  Resp 16  SpO2 100%  LMP 02/08/2015 Physical Exam  Constitutional: She is oriented to person, place, and time. She appears well-developed and well-nourished. No distress.  HENT:  Head: Normocephalic.  Eyes: Conjunctivae are normal. Pupils are equal, round, and reactive to light. No scleral icterus.  Neck: Normal range of motion. Neck supple. No thyromegaly present.  Cardiovascular: Normal rate and regular rhythm.  Exam reveals no gallop and  no friction rub.   No murmur heard. Pulmonary/Chest: Effort normal and breath sounds normal. No respiratory distress. She has no wheezes. She has no rales.  Abdominal: Soft. Bowel sounds are normal. She exhibits no distension. There is no tenderness. There is no rebound.  Genitourinary:     Musculoskeletal: Normal range of motion.  Neurological: She is alert and oriented to person, place, and time.  Skin: Skin is warm and dry. No rash noted.  Psychiatric: She has a normal  mood and affect. Her behavior is normal.    ED Course  Procedures (including critical care time) Labs Review Labs Reviewed  WET PREP, GENITAL - Abnormal; Notable for the following:    WBC, Wet Prep HPF POC FEW (*)    All other components within normal limits  HERPES SIMPLEX VIRUS CULTURE  PREGNANCY, URINE  GC/CHLAMYDIA PROBE AMP ([place]) NOT AT [facility]    Imaging Review No results found.   EKG Interpretation None      MDM   Final diagnoses:  Vaginal pain    HSV pending. Her preference is to await diagnostics before treatment.    Tanna Furry, MD 02/22/15 [phone]

## 2015-02-22 NOTE — ED Notes (Signed)
Per pt, states she thinks she might have been allergic to a condom-vaginal rash, burns with urination

## 2015-02-22 NOTE — Discharge Instructions (Signed)
You have been tested for herpes today. Results will not be available for 48 hours.  Will be contacted by phone if your results are positive, and medications will be called to the pharmacy for you.  [facility] with [facility] as needed.  Cool or room temperature sitz baths as needed for any discomfort.

## 2015-02-23 LAB — HERPES SIMPLEX VIRUS CULTURE: Culture: DETECTED

## 2015-02-23 LAB — GC/CHLAMYDIA PROBE AMP (~~LOC~~) NOT AT ARMC
CHLAMYDIA, DNA PROBE: POSITIVE — AB
NEISSERIA GONORRHEA: NEGATIVE

## 2015-02-24 ENCOUNTER — Telehealth (HOSPITAL_BASED_OUTPATIENT_CLINIC_OR_DEPARTMENT_OTHER): Payer: Self-pay | Admitting: Emergency Medicine

## 2015-02-24 NOTE — Telephone Encounter (Signed)
Post ED Visit - Positive Culture Follow-up: Successful Patient Follow-Up  Culture assessed and recommendations reviewed by: []  Heide Guile, Pharm.D., BCPS-AQ ID []  Alycia Rossetti, Pharm.D., BCPS []  Amelia, Pharm.D., BCPS, AAHIVP []  Legrand Como, Pharm.D., BCPS, AAHIVP []  Tegan Magsam, Pharm.D. []  Milus Glazier, Pharm.D. 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

## 2015-02-24 NOTE — Telephone Encounter (Signed)
Chart handoff to EDP for treatment plan for + Chlamydia

## 2015-02-24 NOTE — Progress Notes (Signed)
ED Antimicrobial Stewardship Positive Culture Follow Up   Karina [name] is an 29 y.o. female who presented to [facility] on 02/22/2015 with a chief complaint of  Chief Complaint  Patient presents with  . vaginal rash     Recent Results (from the past 720 hour(s))  Wet prep, genital     Status: Abnormal   Collection Time: 02/22/15  7:45 AM  Result Value Ref Range Status   Yeast Wet Prep HPF POC NONE SEEN NONE SEEN Final   Trich, Wet Prep NONE SEEN NONE SEEN Final   Clue Cells Wet Prep HPF POC NONE SEEN NONE SEEN Final   WBC, Wet Prep HPF POC FEW (A) NONE SEEN Final  Herpes simplex virus culture     Status: None   Collection Time: 02/22/15  7:45 AM  Result Value Ref Range Status   Specimen Description ENDOCERVICAL  Final   Special Requests NONE  Final   Culture   Final    Herpes Simplex Type 2 detected. Performed at [facility]    Report Status 02/23/2015 FINAL  Final    [x]  Patient discharged originally without antimicrobial agent and treatment is now indicated  New antibiotic prescription: Valtrex 1 gm BID x 10 days  ED Provider: Jeannett Senior, PA-C   [name] 02/24/2015, 9:11 AM Infectious Diseases Pharmacist Phone# [phone]

## 2015-02-24 NOTE — Telephone Encounter (Deleted)
Post ED Visit - Positive Culture Follow-up  Culture report reviewed by antimicrobial stewardship pharmacist: []  Sundra Aland, Pharm.D., BCPS []  Heide Guile, Pharm.D., BCPS []  Alycia Rossetti, Pharm.D., BCPS []  [place], Pharm.D., BCPS, AAHIVP []  Legrand Como, Pharm.D., BCPS, AAHIVP []  Isac Sarna, Pharm.D., BCPS  Positive *** culture Treated with ***, organism sensitive to the same and no further patient follow-up is required at this time.  Hazle Nordmann 02/24/2015, 11:47 AM

## 2015-02-26 ENCOUNTER — Telehealth: Payer: Self-pay | Admitting: Emergency Medicine

## 2015-02-26 NOTE — Telephone Encounter (Signed)
Post ED Visit - Positive Culture Follow-up: Successful Patient Follow-Up  Positive Chlamydia culture  [x]  Patient discharged without antimicrobial prescription and treatment is now indicated []  Organism is resistant to prescribed ED discharge antimicrobial []  Patient with positive blood cultures  Changes discussed with ED provider: Monico Blitz, PA New antibiotic prescription: Azithromycin 1, 000 mg PO x once Called to [facility] [phone]  Contacted patient, date 03/08/15, time 70 ID verified, patient notified of positive Chlamydia and need for treatment. STD instructions provided, RX called to [facility].  Ernesta Amble 02/26/2015, 2:51 PM

## 2015-03-02 ENCOUNTER — Telehealth (HOSPITAL_BASED_OUTPATIENT_CLINIC_OR_DEPARTMENT_OTHER): Payer: Self-pay | Admitting: Emergency Medicine

## 2015-08-22 ENCOUNTER — Emergency Department (HOSPITAL_COMMUNITY): Payer: Medicaid Other

## 2015-08-22 ENCOUNTER — Emergency Department (HOSPITAL_COMMUNITY)
Admission: EM | Admit: 2015-08-22 | Discharge: 2015-08-22 | Disposition: A | Discharge status: Home / Self Care | Payer: Medicaid Other | Attending: Emergency Medicine | Admitting: Emergency Medicine

## 2015-08-22 ENCOUNTER — Encounter (HOSPITAL_COMMUNITY): Payer: Self-pay | Admitting: *Deleted

## 2015-08-22 DIAGNOSIS — Z87891 Personal history of nicotine dependence: Secondary | ICD-10-CM | POA: Diagnosis not present

## 2015-08-22 DIAGNOSIS — Z86018 Personal history of other benign neoplasm: Secondary | ICD-10-CM | POA: Diagnosis not present

## 2015-08-22 DIAGNOSIS — Z792 Long term (current) use of antibiotics: Secondary | ICD-10-CM | POA: Diagnosis not present

## 2015-08-22 DIAGNOSIS — Z79899 Other long term (current) drug therapy: Secondary | ICD-10-CM | POA: Insufficient documentation

## 2015-08-22 DIAGNOSIS — I10 Essential (primary) hypertension: Secondary | ICD-10-CM | POA: Diagnosis not present

## 2015-08-22 DIAGNOSIS — R197 Diarrhea, unspecified: Secondary | ICD-10-CM | POA: Diagnosis not present

## 2015-08-22 DIAGNOSIS — J069 Acute upper respiratory infection, unspecified: Secondary | ICD-10-CM | POA: Diagnosis not present

## 2015-08-22 DIAGNOSIS — R05 Cough: Secondary | ICD-10-CM | POA: Diagnosis present

## 2015-08-22 DIAGNOSIS — B9789 Other viral agents as the cause of diseases classified elsewhere: Secondary | ICD-10-CM

## 2015-08-22 DIAGNOSIS — R079 Chest pain, unspecified: Secondary | ICD-10-CM | POA: Diagnosis not present

## 2015-08-22 DIAGNOSIS — Z8619 Personal history of other infectious and parasitic diseases: Secondary | ICD-10-CM | POA: Diagnosis not present

## 2015-08-22 MED ORDER — AEROCHAMBER PLUS FLO-VU MEDIUM MISC
1.0000 | Freq: Once | Status: AC
  Administered 2015-08-22: 1
  Filled 2015-08-22: qty 1

## 2015-08-22 MED ORDER — ALBUTEROL SULFATE HFA 108 (90 BASE) MCG/ACT IN AERS
1.0000 | INHALATION_SPRAY | Freq: Once | RESPIRATORY_TRACT | Status: AC
  Administered 2015-08-22: 1 via RESPIRATORY_TRACT
  Filled 2015-08-22: qty 6.7

## 2015-08-22 NOTE — ED Notes (Signed)
PT reports she has not taken BP meds this AM. BP elevated at triage.

## 2015-08-22 NOTE — Discharge Instructions (Signed)
Upper Respiratory Infection, Adult Most upper respiratory infections (URIs) are a viral infection of the air passages leading to the lungs. A URI affects the nose, throat, and upper air passages. The most common type of URI is nasopharyngitis and is typically referred to as "the common cold." URIs run their course and usually go away on their own. Most of the time, a URI does not require medical attention, but sometimes a bacterial infection in the upper airways can follow a viral infection. This is called a secondary infection. Sinus and middle ear infections are common types of secondary upper respiratory infections. Bacterial pneumonia can also complicate a URI. A URI can worsen asthma and chronic obstructive pulmonary disease (COPD). Sometimes, these complications can require emergency medical care and may be life threatening.  CAUSES Almost all URIs are caused by viruses. A virus is a type of germ and can spread from one person to another.  RISKS FACTORS You may be at risk for a URI if:   You smoke.   You have chronic heart or lung disease.  You have a weakened defense (immune) system.   You are very young or very old.   You have nasal allergies or asthma.  You work in crowded or poorly ventilated areas.  You work in health care facilities or schools. SIGNS AND SYMPTOMS  Symptoms typically develop 2-3 days after you come in contact with a cold virus. Most viral URIs last 7-10 days. However, viral URIs from the influenza virus (flu virus) can last 14-18 days and are typically more severe. Symptoms may include:   Runny or stuffy (congested) nose.   Sneezing.   Cough.   Sore throat.   Headache.   Fatigue.   Fever.   Loss of appetite.   Pain in your forehead, behind your eyes, and over your cheekbones (sinus pain).  Muscle aches.  DIAGNOSIS  Your health care provider may diagnose a URI by:  Physical exam.  Tests to check that your symptoms are not due to  another condition such as:  Strep throat.  Sinusitis.  Pneumonia.  Asthma. TREATMENT  A URI goes away on its own with time. It cannot be cured with medicines, but medicines may be prescribed or recommended to relieve symptoms. Medicines may help:  Reduce your fever.  Reduce your cough.  Relieve nasal congestion. HOME CARE INSTRUCTIONS   Take medicines only as directed by your health care provider.   Gargle warm saltwater or take cough drops to comfort your throat as directed by your health care provider.  Use a warm mist humidifier or inhale steam from a shower to increase air moisture. This may make it easier to breathe.  Drink enough fluid to keep your urine clear or pale yellow.   Eat soups and other clear broths and maintain good nutrition.   Rest as needed.   Return to work when your temperature has returned to normal or as your health care provider advises. You may need to stay home longer to avoid infecting others. You can also use a face mask and careful hand washing to prevent spread of the virus.  Increase the usage of your inhaler if you have asthma.   Do not use any tobacco products, including cigarettes, chewing tobacco, or electronic cigarettes. If you need help quitting, ask your health care provider. PREVENTION  The best way to protect yourself from getting a cold is to practice good hygiene.   Avoid oral or hand contact with people with cold  symptoms.   Wash your hands often if contact occurs.  There is no clear evidence that vitamin C, vitamin E, echinacea, or exercise reduces the chance of developing a cold. However, it is always recommended to get plenty of rest, exercise, and practice good nutrition.  SEEK MEDICAL CARE IF:   You are getting worse rather than better.   Your symptoms are not controlled by medicine.   You have chills.  You have worsening shortness of breath.  You have brown or red mucus.  You have yellow or brown nasal  discharge.  You have pain in your face, especially when you bend forward.  You have a fever.  You have swollen neck glands.  You have pain while swallowing.  You have white areas in the back of your throat. SEEK IMMEDIATE MEDICAL CARE IF:   You have severe or persistent:  Headache.  Ear pain.  Sinus pain.  Chest pain.  You have chronic lung disease and any of the following:  Wheezing.  Prolonged cough.  Coughing up blood.  A change in your usual mucus.  You have a stiff neck.  You have changes in your:  Vision.  Hearing.  Thinking.  Mood. MAKE SURE YOU:   Understand these instructions.  Will watch your condition.  Will get help right away if you are not doing well or get worse.   This information is not intended to replace advice given to you by your health care provider. Make sure you discuss any questions you have with your health care provider.   Document Released: 02/06/2001 Document Revised: 12/28/2014 Document Reviewed: 11/18/2013 Elsevier Interactive Patient Education 2016 [employer]  Cough, Adult Coughing is a reflex that clears your throat and your airways. Coughing helps to heal and protect your lungs. It is normal to cough occasionally, but a cough that happens with other symptoms or lasts a long time may be a sign of a condition that needs treatment. A cough may last only 2-3 weeks (acute), or it may last longer than 8 weeks (chronic). CAUSES Coughing is commonly caused by:  Breathing in substances that irritate your lungs.  A viral or bacterial respiratory infection.  Allergies.  Asthma.  Postnasal drip.  Smoking.  Acid backing up from the stomach into the esophagus (gastroesophageal reflux).  Certain medicines.  Chronic lung problems, including COPD (or rarely, lung cancer).  Other medical conditions such as heart failure. HOME CARE INSTRUCTIONS  Pay attention to any changes in your symptoms. Take these actions to  help with your discomfort:  Take medicines only as told by your health care provider.  If you were prescribed an antibiotic medicine, take it as told by your health care provider. Do not stop taking the antibiotic even if you start to feel better.  Talk with your health care provider before you take a cough suppressant medicine.  Drink enough fluid to keep your urine clear or pale yellow.  If the air is dry, use a cold steam vaporizer or humidifier in your bedroom or your home to help loosen secretions.  Avoid anything that causes you to cough at work or at home.  If your cough is worse at night, try sleeping in a semi-upright position.  Avoid cigarette smoke. If you smoke, quit smoking. If you need help quitting, ask your health care provider.  Avoid caffeine.  Avoid alcohol.  Rest as needed. SEEK MEDICAL CARE IF:   You have new symptoms.  You cough up pus.  Your cough  does not get better after 2-3 weeks, or your cough gets worse.  You cannot control your cough with suppressant medicines and you are losing sleep.  You develop pain that is getting worse or pain that is not controlled with pain medicines.  You have a fever.  You have unexplained weight loss.  You have night sweats. SEEK IMMEDIATE MEDICAL CARE IF:  You cough up blood.  You have difficulty breathing.  Your heartbeat is very fast.   This information is not intended to replace advice given to you by your health care provider. Make sure you discuss any questions you have with your health care provider.   Document Released: 02/09/2011 Document Revised: 05/04/2015 Document Reviewed: 10/20/2014 Elsevier Interactive Patient Education 2016 Elsevier Inc.  Viral Infections A viral infection can be caused by different types of viruses.Most viral infections are not serious and resolve on their own. However, some infections may cause severe symptoms and may lead to further complications. SYMPTOMS Viruses can  frequently cause:  Minor sore throat.  Aches and pains.  Headaches.  Runny nose.  Different types of rashes.  Watery eyes.  Tiredness.  Cough.  Loss of appetite.  Gastrointestinal infections, resulting in nausea, vomiting, and diarrhea. These symptoms do not respond to antibiotics because the infection is not caused by bacteria. However, you might catch a bacterial infection following the viral infection. This is sometimes called a "superinfection." Symptoms of such a bacterial infection may include:  Worsening sore throat with pus and difficulty swallowing.  Swollen neck glands.  Chills and a high or persistent fever.  Severe headache.  Tenderness over the sinuses.  Persistent overall ill feeling (malaise), muscle aches, and tiredness (fatigue).  Persistent cough.  Yellow, green, or brown mucus production with coughing. HOME CARE INSTRUCTIONS   Only take over-the-counter or prescription medicines for pain, discomfort, diarrhea, or fever as directed by your caregiver.  Drink enough water and fluids to keep your urine clear or pale yellow. Sports drinks can provide valuable electrolytes, sugars, and hydration.  Get plenty of rest and maintain proper nutrition. Soups and broths with crackers or rice are fine. SEEK IMMEDIATE MEDICAL CARE IF:   You have severe headaches, shortness of breath, chest pain, neck pain, or an unusual rash.  You have uncontrolled vomiting, diarrhea, or you are unable to keep down fluids.  You or your child has an oral temperature above 102 F (38.9 C), not controlled by medicine.  Your baby is older than 3 months with a rectal temperature of 102 F (38.9 C) or higher.  Your baby is 65 months old or younger with a rectal temperature of 100.4 F (38 C) or higher. MAKE SURE YOU:   Understand these instructions.  Will watch your condition.  Will get help right away if you are not doing well or get worse.   This information is not  intended to replace advice given to you by your health care provider. Make sure you discuss any questions you have with your health care provider.   Follow up with your PCP if symptoms do not improve. Take OTC mucinex as needed for symptoms relief. AVOID sudafed due to your high blood pressure. Take albuterol inhaler as needed for breathing. Return to the ED if you experience fever, difficulty breathing, chest pain.

## 2015-08-22 NOTE — ED Notes (Signed)
Pt reports a dry cough for several days.

## 2015-08-22 NOTE — ED Notes (Signed)
Patient transported to X-ray 

## 2015-08-22 NOTE — ED Notes (Signed)
Declined W/C at D/C and was escorted to lobby by RN. 

## 2015-08-22 NOTE — ED Provider Notes (Signed)
CSN: [id number]     Arrival date & time 08/22/15  [id number] History  By signing my name below, I, Hansel Feinstein, attest that this documentation has been prepared under the direction and in the presence of [employer], PA-C.  Electronically Signed: Hansel Feinstein, ED Scribe. 08/22/2015. 9:55 AM.     No chief complaint on file.  The history is provided by the patient. No language interpreter was used.    HPI Comments: Karina [name] is a 29 y.o. female with h/o HTN who presents to the Emergency Department complaining of moderate, intermittent, worsening productive cough onset 3 days ago. She states associated CP secondary to coughing, subjective fever, SOB, diarrhea (x1 this morning), post-tussive retching. She notes that she has not looked at the color of the phlegm her cough is producing. Pt has taken Tylenol cold and flu and tried hot tea and honey with little to no relief. She also notes that she tried one of her kids nebulizer treatments at home with no relief. Pt is a current smoker. No h/o asthma. She denies sore throat, otalgia, emesis, abdominal pain.   Past Medical History  Diagnosis Date  . Fibroids   . Trichomonas   . Pregnancy   . Depression     Postpartum Depression with 1st pregnancy  . History of chlamydia   . Hypertension     gestational  . Pregnancy induced hypertension    Past Surgical History  Procedure Laterality Date  . Dilation and curettage of uterus    . Cervical cerclage     Family History  Problem Relation Age of Onset  . Hypertension Mother   . Hypertension Father    Social History  Substance Use Topics  . Smoking status: Former Smoker    Quit date: 08/12/2012  . Smokeless tobacco: Never Used  . Alcohol Use: No   OB History    Gravida Para Term Preterm AB TAB SAB Ectopic Multiple Living   6 2 1 1 3  0 3 0 0 2     Review of Systems  Constitutional: Positive for fever ( subjective).  HENT: Negative for ear pain and sore throat.   Respiratory:  Positive for cough and shortness of breath.   Cardiovascular: Positive for chest pain.  Gastrointestinal: Positive for diarrhea. Negative for vomiting and abdominal pain.  All other systems reviewed and are negative.  Allergies  Review of patient's allergies indicates no known allergies.  Home Medications   Prior to Admission medications   Medication Sig Start Date End Date Taking? Authorizing Provider  fluconazole (DIFLUCAN) 150 MG tablet Take 1 tablet (150 mg total) by mouth once. Patient not taking: Reported on 02/22/2015 11/30/14   Deirdre C Poe, CNM  lisinopril-hydrochlorothiazide (PRINZIDE,ZESTORETIC) 20-25 MG per tablet Take 1 tablet by mouth daily. 11/22/14   Truett Mainland, DO  metroNIDAZOLE (FLAGYL) 500 MG tablet Take 1 tablet (500 mg total) by mouth 2 (two) times daily. 11/30/14   Deirdre C Poe, CNM   BP 149/104 mmHg  Pulse 83  Temp(Src) 98.4 F (36.9 C) (Oral)  Resp 22  Ht 5\' 5"  (1.651 m)  Wt 210 lb (95.255 kg)  BMI 34.95 kg/m2  SpO2 98% Physical Exam  Constitutional: She is oriented to person, place, and time. She appears well-developed and well-nourished. No distress.  HENT:  Head: Normocephalic and atraumatic.  Mouth/Throat: Oropharynx is clear and moist. No oropharyngeal exudate.  Throat non-errythematous. No tonsilar exudate. Uvula midline.  Eyes: Conjunctivae and EOM are normal. Pupils  are equal, round, and reactive to light. Right eye exhibits no discharge. Left eye exhibits no discharge. No scleral icterus.  Neck: Neck supple. No tracheal deviation present.  Cardiovascular: Normal rate, regular rhythm, normal heart sounds and intact distal pulses.  Exam reveals no gallop and no friction rub.   No murmur heard. Pulmonary/Chest: Effort normal and breath sounds normal. No stridor. No respiratory distress. She has no wheezes. She has no rales. She exhibits tenderness.  Pt coughing, non-productive  Abdominal: Soft. She exhibits no distension. There is no tenderness.  There is no guarding.  Musculoskeletal: Normal range of motion. She exhibits no edema.  Lymphadenopathy:    She has no cervical adenopathy.  Neurological: She is alert and oriented to person, place, and time. She exhibits normal muscle tone.  Skin: Skin is warm and dry. No rash noted. She is not diaphoretic. No erythema. No pallor.  Psychiatric: She has a normal mood and affect. Her behavior is normal.  Nursing note and vitals reviewed.  ED Course  Procedures (including critical care time) DIAGNOSTIC STUDIES: Oxygen Saturation is 98% on RA, normal by my interpretation.    COORDINATION OF CARE: 9:52 AM Discussed treatment plan with pt at bedside which includes CXR and pt agreed to plan.   Imaging Review Dg Chest 2 View  08/22/2015  CLINICAL DATA:  Chest pain, cough and shortness of breath for 3 days EXAM: CHEST  2 VIEW COMPARISON:  August 27, 2014 FINDINGS: The heart size and mediastinal contours are within normal limits. There is no focal infiltrate, pulmonary edema, or pleural effusion. The visualized skeletal structures are unremarkable. IMPRESSION: No active cardiopulmonary disease. Electronically Signed   By: Abelardo Diesel M.D.   On: 08/22/2015 10:19   I have personally reviewed and evaluated these images as part of my medical decision-making.  MDM   Final diagnoses:  Viral URI with cough    Pt symptoms consistent with URI. CXR negative for acute infiltrate. Pt will be discharged with symptomatic treatment and albuterol inhaler. Recommend OTC Mucinex and ibuprofen for chest tenderness caused by coughing. Avoid use of sudafed due to HTN. Discussed return precautions. Pt will follow up with PCP if symptoms do not improve. Pt is hemodynamically stable & in NAD prior to discharge.   I personally performed the services described in this documentation, which was scribed in my presence. The recorded information has been reviewed and is accurate.     Dondra Spry [place],  PA-C 08/22/15 [place], MD 08/23/15 [phone]

## 2015-08-24 ENCOUNTER — Emergency Department (HOSPITAL_BASED_OUTPATIENT_CLINIC_OR_DEPARTMENT_OTHER)
Admission: EM | Admit: 2015-08-24 | Discharge: 2015-08-24 | Discharge status: Against Medical Advice | Payer: Medicaid Other | Attending: Emergency Medicine | Admitting: Emergency Medicine

## 2015-08-24 DIAGNOSIS — R05 Cough: Secondary | ICD-10-CM | POA: Diagnosis not present

## 2015-08-24 DIAGNOSIS — I10 Essential (primary) hypertension: Secondary | ICD-10-CM | POA: Diagnosis not present

## 2015-08-24 NOTE — ED Notes (Signed)
Pt refuses vital signs or to allow this rn to finish triage assessment.

## 2015-08-24 NOTE — ED Notes (Addendum)
Pt reports cough x 1 week, seen at [facility] er on Monday, dx with "virus". Pt requests "an antibiotic to help me feel better." explained to pt that we do not treat viruses with antibiotics, but that the doctor would listen to her lungs and determine if she needs a chest xray to rule out a bronchitis or pneumonia. Pt states "if you aren't going to give me an antibiotic I'm leaving." explained to pt that if she has a bronchitis or pneumonia, the doctor may prescribe an antibiotic. Pt states "I need to see her now, I have to get him to school in 15 mintues". Explained to pt that dr. Canary Brim is with another patient and that there are several patients ahead of her to be seen. Pt states "I'm leaving then." pt encouraged to await md eval, explained that she could have a secondary infection. Pt states "no, I'm not waiting here if you can't promise me you'll give me antibiotics. I need them!" pt signs out ama and leaves ed with her son, despite encouragement from this rn to await md eval.

## 2016-01-10 IMAGING — CT CT ABD-PELV W/ CM
1 of 2 series · 15 of 32 positions shown, 19 images · IV contrast (OMNIPAQUE 300)
Comparison: None.

CLINICAL DATA: Left-sided abdominal pain for 2 days. Recent
diagnosis of pelvic inflammatory disease.

EXAM:
CT ABDOMEN AND PELVIS WITH CONTRAST
TECHNIQUE: Multidetector CT imaging of the abdomen and pelvis was performed
using the standard protocol following bolus administration of
intravenous contrast.
CONTRAST:  25mL OMNIPAQUE IOHEXOL 300 MG/ML SOLN, 100mL OMNIPAQUE
IOHEXOL 300 MG/ML SOLN

[Series 2: abd/pel with · axial · 0.84mm/px · z∈[+1078,+1508]mm · 15 of 96 slices shown, 19 images]
[im 5/96  soft-tissue]
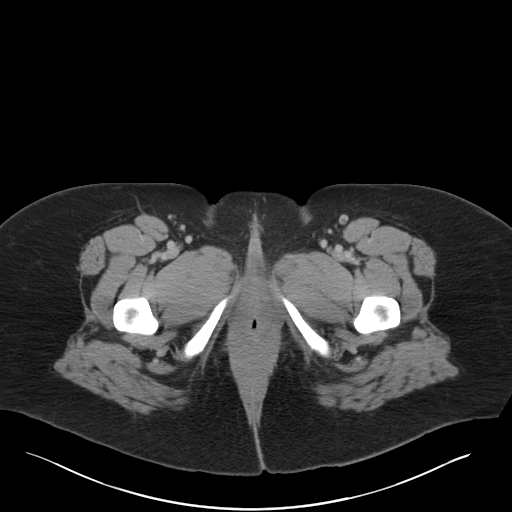
[im 5/96  bone]
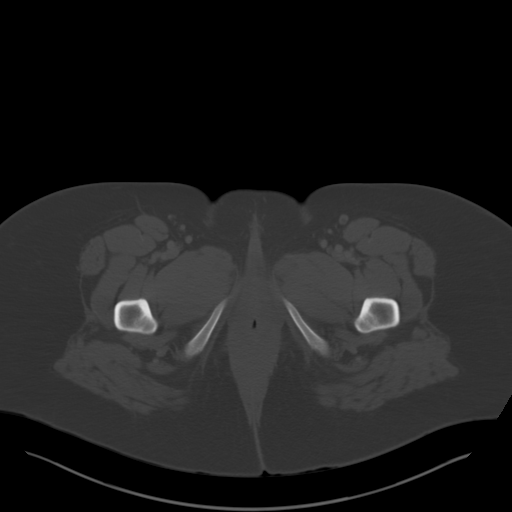
[im 13/96  soft-tissue]
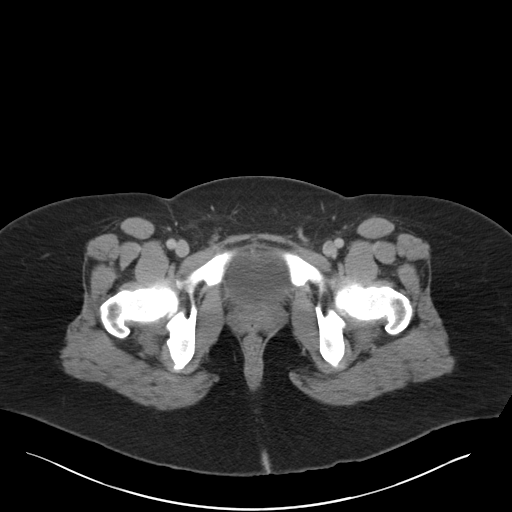
[im 21/96  soft-tissue]
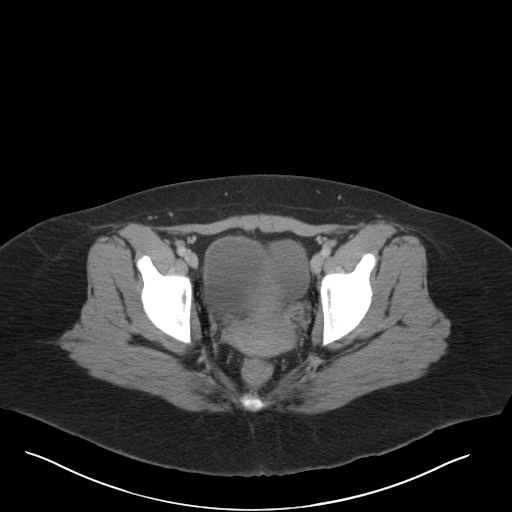
[im 25/96  soft-tissue]
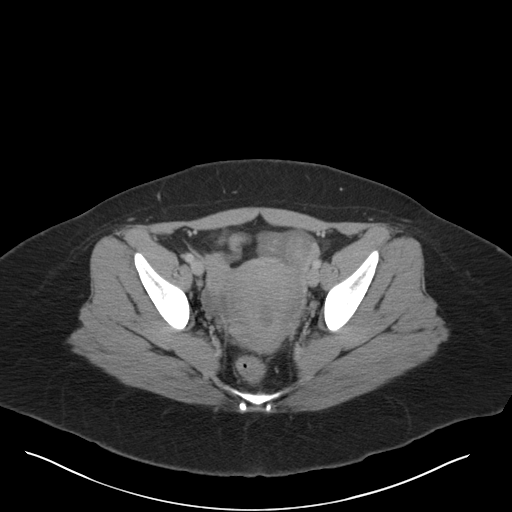
[im 34/96  soft-tissue]
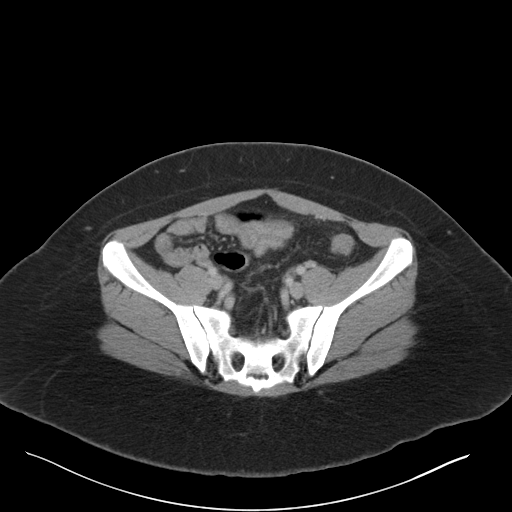
[im 42/96  soft-tissue]
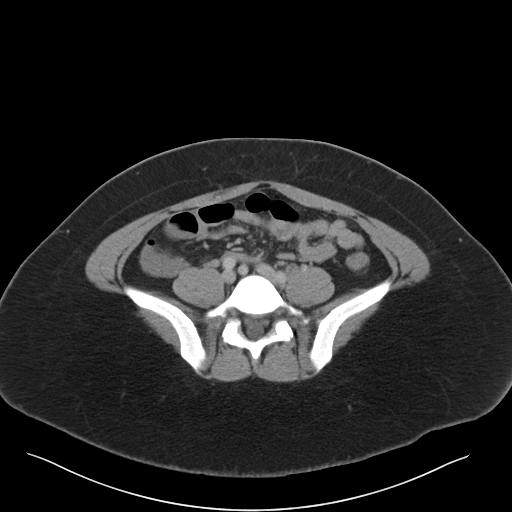
[im 50/96  soft-tissue]
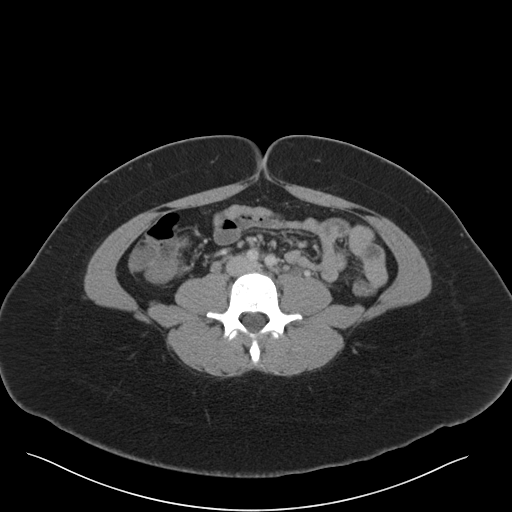
[im 54/96  soft-tissue]
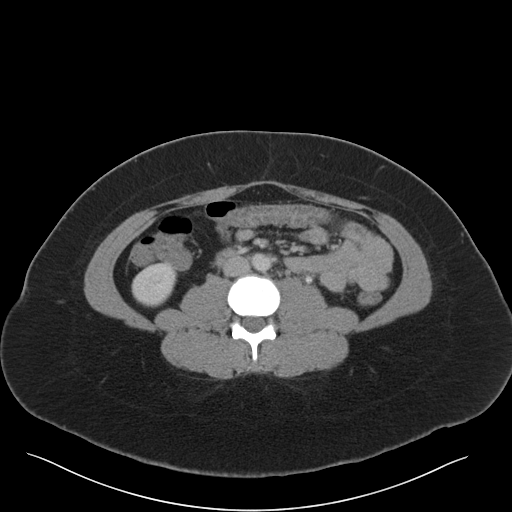
[im 62/96  soft-tissue]
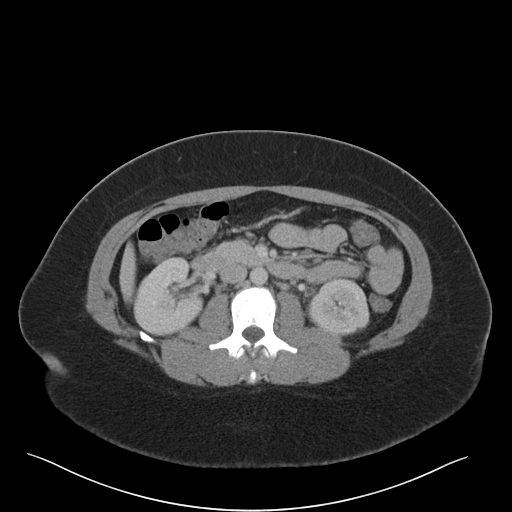
[im 62/96  bone]
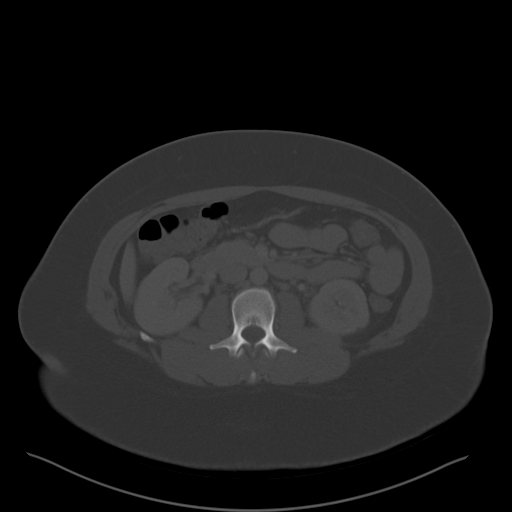
[im 71/96  soft-tissue]
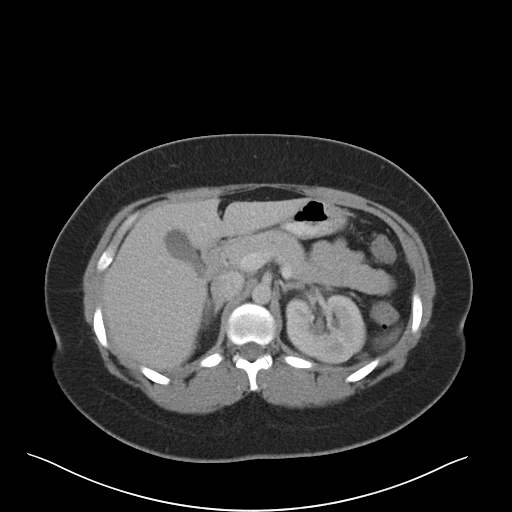
[im 75/96  soft-tissue]
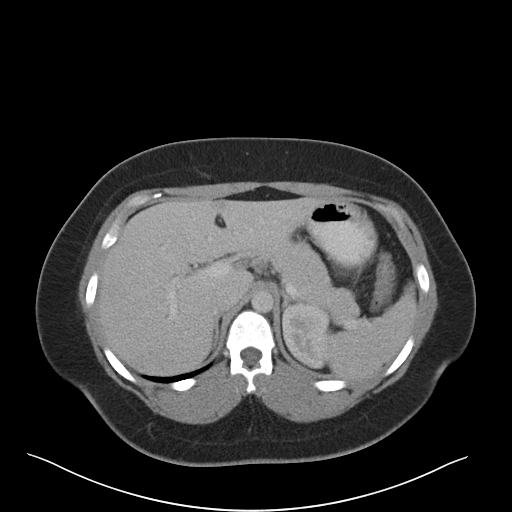
[im 79/96  lung]
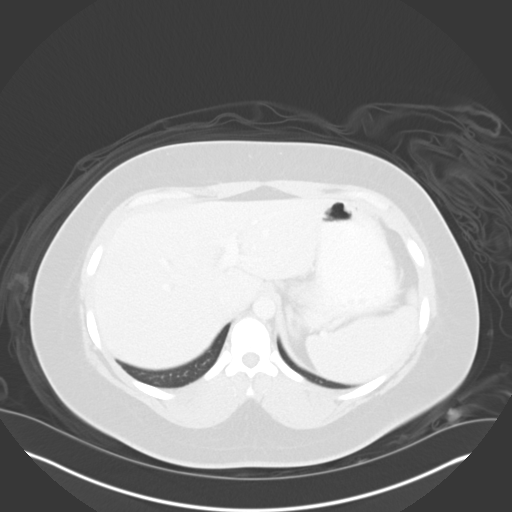
[im 83/96  soft-tissue]
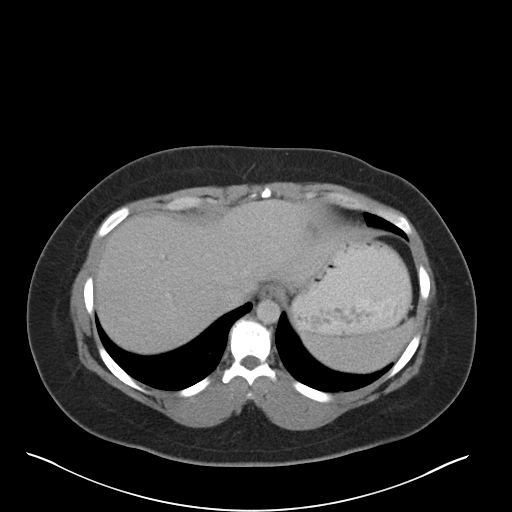
[im 83/96  lung]
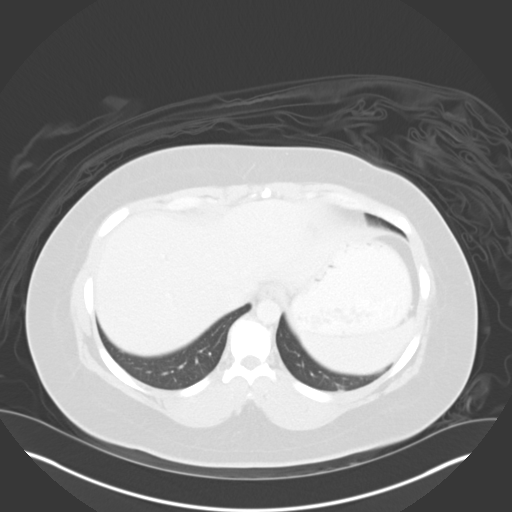
[im 87/96  lung]
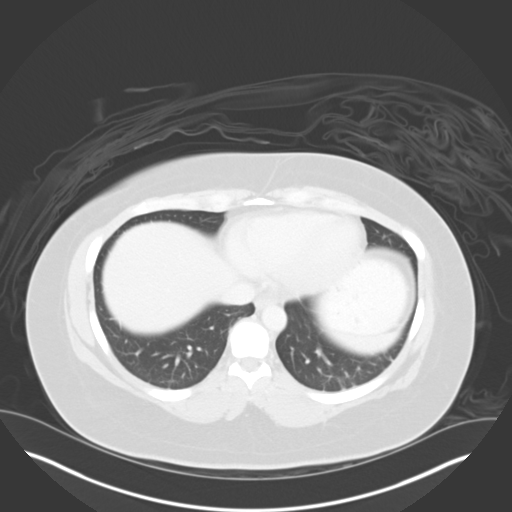
[im 91/96  soft-tissue]
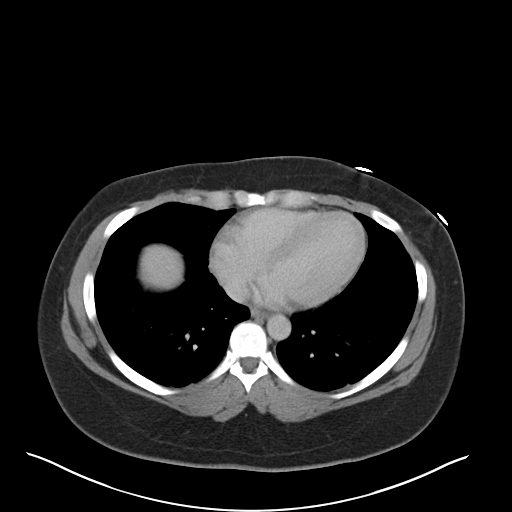
[im 91/96  lung]
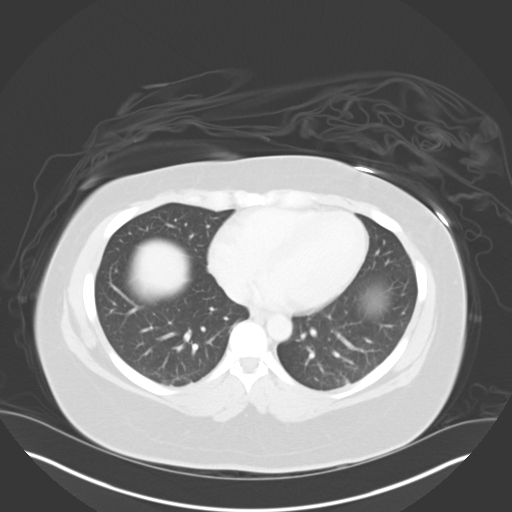

[15 of 32 positions shown; findings below may reference images not displayed]

FINDINGS: BODY WALL: Unremarkable.

LOWER CHEST: Unremarkable.

ABDOMEN/PELVIS:

Liver: No focal abnormality.

Biliary: No evidence of biliary obstruction or stone.

Pancreas: Unremarkable.

Spleen: Unremarkable.

Adrenals: Unremarkable.

Kidneys and ureters: 5 mm stone at the left ureteral vesicular
junction with mild hydroureter and borderline hydronephrosis. There
is marked urothelial thickening throughout the left urinary
collecting system, with delayed left renal enhancement. Additional
punctate stone present in the left lower pole. No right-sided
hydronephrosis.

Bladder: Unremarkable.

Reproductive: Fluid collection around the left ovary is again noted,
unchanged since pelvic sonography 1 day prior. The adjacent ovary
has a normal appearance.

Bowel: No obstruction. Normal appendix.

Retroperitoneum: No mass or adenopathy.

Peritoneum: No ascites or pneumoperitoneum.

Vascular: No acute abnormality.

OSSEOUS: No acute abnormalities.
IMPRESSION: 1. 5 mm stone at the left UVJ with prominent ureteritis and mild
obstructive change.
2. Left adnexal cystic collection, reference pelvic sonography from
yesterday.

## 2016-01-11 ENCOUNTER — Emergency Department (HOSPITAL_BASED_OUTPATIENT_CLINIC_OR_DEPARTMENT_OTHER): Payer: Self-pay

## 2016-01-11 ENCOUNTER — Encounter (HOSPITAL_BASED_OUTPATIENT_CLINIC_OR_DEPARTMENT_OTHER): Payer: Self-pay | Admitting: Emergency Medicine

## 2016-01-11 ENCOUNTER — Emergency Department (HOSPITAL_BASED_OUTPATIENT_CLINIC_OR_DEPARTMENT_OTHER)
Admission: EM | Admit: 2016-01-11 | Discharge: 2016-01-11 | Disposition: A | Discharge status: Home / Self Care | Payer: Self-pay | Attending: Emergency Medicine | Admitting: Emergency Medicine

## 2016-01-11 DIAGNOSIS — I10 Essential (primary) hypertension: Secondary | ICD-10-CM | POA: Insufficient documentation

## 2016-01-11 DIAGNOSIS — Z87891 Personal history of nicotine dependence: Secondary | ICD-10-CM | POA: Insufficient documentation

## 2016-01-11 DIAGNOSIS — F329 Major depressive disorder, single episode, unspecified: Secondary | ICD-10-CM | POA: Insufficient documentation

## 2016-01-11 DIAGNOSIS — Z79899 Other long term (current) drug therapy: Secondary | ICD-10-CM | POA: Insufficient documentation

## 2016-01-11 DIAGNOSIS — R102 Pelvic and perineal pain: Secondary | ICD-10-CM | POA: Insufficient documentation

## 2016-01-11 LAB — CBC WITH DIFFERENTIAL/PLATELET
BASOS ABS: 0 10*3/uL (ref 0.0–0.1)
BASOS PCT: 1 %
Eosinophils Absolute: 0.1 10*3/uL (ref 0.0–0.7)
Eosinophils Relative: 2 %
HEMATOCRIT: 42.3 % (ref 36.0–46.0)
HEMOGLOBIN: 14.6 g/dL (ref 12.0–15.0)
Lymphocytes Relative: 37 %
Lymphs Abs: 2.4 10*3/uL (ref 0.7–4.0)
MCH: 31.7 pg (ref 26.0–34.0)
MCHC: 34.5 g/dL (ref 30.0–36.0)
MCV: 91.8 fL (ref 78.0–100.0)
Monocytes Absolute: 0.8 10*3/uL (ref 0.1–1.0)
Monocytes Relative: 12 %
NEUTROS ABS: 3.2 10*3/uL (ref 1.7–7.7)
NEUTROS PCT: 48 %
Platelets: 419 10*3/uL — ABNORMAL HIGH (ref 150–400)
RBC: 4.61 MIL/uL (ref 3.87–5.11)
RDW: 13.1 % (ref 11.5–15.5)
WBC: 6.5 10*3/uL (ref 4.0–10.5)

## 2016-01-11 LAB — COMPREHENSIVE METABOLIC PANEL
ALBUMIN: 4.1 g/dL (ref 3.5–5.0)
ALK PHOS: 78 U/L (ref 38–126)
ALT: 11 U/L — ABNORMAL LOW (ref 14–54)
AST: 16 U/L (ref 15–41)
Anion gap: 7 (ref 5–15)
BUN: 10 mg/dL (ref 6–20)
CALCIUM: 9.7 mg/dL (ref 8.9–10.3)
CO2: 30 mmol/L (ref 22–32)
Chloride: 100 mmol/L — ABNORMAL LOW (ref 101–111)
Creatinine, Ser: 0.92 mg/dL (ref 0.44–1.00)
GFR calc Af Amer: >60 mL/min (ref >60)
GFR calc non Af Amer: >60 mL/min (ref >60)
Glucose, Bld: 91 mg/dL (ref 65–99)
POTASSIUM: 3.9 mmol/L (ref 3.5–5.1)
Sodium: 137 mmol/L (ref 135–145)
TOTAL PROTEIN: 7.5 g/dL (ref 6.5–8.1)
Total Bilirubin: 0.7 mg/dL (ref 0.3–1.2)

## 2016-01-11 LAB — URINALYSIS, ROUTINE W REFLEX MICROSCOPIC
Bilirubin Urine: NEGATIVE
Glucose, UA: NEGATIVE mg/dL
Hgb urine dipstick: NEGATIVE
Ketones, ur: NEGATIVE mg/dL
Leukocytes, UA: NEGATIVE
Nitrite: NEGATIVE
PROTEIN: NEGATIVE mg/dL
Specific Gravity, Urine: 1.012 (ref 1.005–1.030)
pH: 7.5 (ref 5.0–8.0)

## 2016-01-11 LAB — PREGNANCY, URINE: PREG TEST UR: NEGATIVE

## 2016-01-11 LAB — WET PREP, GENITAL
Sperm: NONE SEEN
Trich, Wet Prep: NONE SEEN
Yeast Wet Prep HPF POC: NONE SEEN

## 2016-01-11 NOTE — Discharge Instructions (Signed)
Please read and follow all provided instructions.  Your diagnoses today include:  1. Right adnexal tenderness   2. Pelvic pain in female     Tests performed today include:  Blood counts and electrolytes - normal   Blood tests to check liver and kidney function   Urine test to look for infection and pregnancy (in women)  Wet prep - shows some infection fighting cells  Ultrasound - results pending  Vital signs. See below for your results today.   Medications prescribed:   None  Take any prescribed medications only as directed.  Home care instructions:   Follow any educational materials contained in this packet.  Follow-up instructions: Please follow-up with your primary care provider or gynecologist in the next 3 days for further evaluation of your symptoms.    Return instructions:  SEEK IMMEDIATE MEDICAL ATTENTION IF:  The pain does not go away or becomes severe   A temperature above 101F develops   Repeated vomiting occurs (multiple episodes)   The pain becomes localized to portions of the abdomen. The right side could possibly be appendicitis. In an adult, the left lower portion of the abdomen could be colitis or diverticulitis.   Blood is being passed in stools or vomit (bright red or black tarry stools)   You develop chest pain, difficulty breathing, dizziness or fainting, or become confused, poorly responsive, or inconsolable (young children)  If you have any other emergent concerns regarding your health  Additional Information: Abdominal (belly) pain can be caused by many things. Your caregiver performed an examination and possibly ordered blood/urine tests and imaging (CT scan, x-rays, ultrasound). Many cases can be observed and treated at home after initial evaluation in the emergency department. Even though you are being discharged home, abdominal pain can be unpredictable. Therefore, you need a repeated exam if your pain does not resolve, returns, or  worsens. Most patients with abdominal pain don't have to be admitted to the hospital or have surgery, but serious problems like appendicitis and gallbladder attacks can start out as nonspecific pain. Many abdominal conditions cannot be diagnosed in one visit, so follow-up evaluations are very important.  Your vital signs today were: BP 135/102 mmHg   Pulse 62   Temp(Src) 98.5 F (36.9 C) (Oral)   Resp 16   Ht 5\' 5"  (1.651 m)   Wt 97.523 kg   BMI 35.78 kg/m2   SpO2 100%   LMP 12/25/2015 If your blood pressure (bp) was elevated above 135/85 this visit, please have this repeated by your doctor within one month. --------------

## 2016-01-11 NOTE — ED Notes (Signed)
Pt in c/o R sided abd pain x several days, denies n/v/d or fever/chills. Ambulatory in NAD.

## 2016-01-11 NOTE — ED Provider Notes (Signed)
CSN: [id number]     Arrival date & time 01/11/16  1548 History   First MD Initiated Contact with Patient 01/11/16 1612     Chief Complaint  Patient presents with  . Abdominal Pain     (Consider location/radiation/quality/duration/timing/severity/associated sxs/prior Treatment) HPI Comments: Patient with history of STI presents with complaint of lower abdominal pain and right-sided abdominal pain starting approximately 1 week ago. Pain is waxing and waning. Patient states that the pain is worse when she is awake. She has had no associated vomiting, diarrhea, fever or chills. No urinary symptoms including dysuria or hematuria. She denies vaginal discharge. She is sexually active and uses protection. No history of abdominal surgeries. Pain is not brought on or changed by food. No heavy NSAID or alcohol use. Onset of symptoms acute. Nothing makes symptoms worse.  The history is provided by the patient.    Past Medical History  Diagnosis Date  . Fibroids   . Trichomonas   . Pregnancy   . Depression     Postpartum Depression with 1st pregnancy  . History of chlamydia   . Hypertension     gestational  . Pregnancy induced hypertension    Past Surgical History  Procedure Laterality Date  . Dilation and curettage of uterus    . Cervical cerclage     Family History  Problem Relation Age of Onset  . Hypertension Mother   . Hypertension Father    Social History  Substance Use Topics  . Smoking status: Former Smoker    Quit date: 08/12/2012  . Smokeless tobacco: Never Used  . Alcohol Use: No   OB History    Gravida Para Term Preterm AB TAB SAB Ectopic Multiple Living   6 2 1 1 3  0 3 0 0 2     Review of Systems  Constitutional: Negative for fever.  HENT: Negative for rhinorrhea and sore throat.   Eyes: Negative for redness.  Respiratory: Negative for cough.   Cardiovascular: Negative for chest pain.  Gastrointestinal: Positive for abdominal pain. Negative for nausea, vomiting  and diarrhea.  Genitourinary: Positive for flank pain. Negative for dysuria, frequency, hematuria, vaginal bleeding and vaginal discharge.  Musculoskeletal: Negative for myalgias.  Skin: Negative for rash.  Neurological: Negative for headaches.   Allergies  Review of patient's allergies indicates no known allergies.  Home Medications   Prior to Admission medications   Medication Sig Start Date End Date Taking? Authorizing Provider  fluconazole (DIFLUCAN) 150 MG tablet Take 1 tablet (150 mg total) by mouth once. Patient not taking: Reported on 02/22/2015 11/30/14   Deirdre C Poe, CNM  lisinopril-hydrochlorothiazide (PRINZIDE,ZESTORETIC) 20-25 MG per tablet Take 1 tablet by mouth daily. 11/22/14   Truett Mainland, DO  metroNIDAZOLE (FLAGYL) 500 MG tablet Take 1 tablet (500 mg total) by mouth 2 (two) times daily. 11/30/14   Deirdre C Poe, CNM   BP 124/78 mmHg  Pulse 77  Temp(Src) 98.5 F (36.9 C) (Oral)  Resp 16  Ht 5\' 5"  (1.651 m)  Wt 97.523 kg  BMI 35.78 kg/m2  SpO2 99%  LMP 12/25/2015   Physical Exam  Constitutional: She appears well-developed and well-nourished.  HENT:  Head: Normocephalic and atraumatic.  Eyes: Conjunctivae are normal. Right eye exhibits no discharge. Left eye exhibits no discharge.  Neck: Normal range of motion. Neck supple.  Cardiovascular: Normal rate, regular rhythm and normal heart sounds.   Pulmonary/Chest: Effort normal and breath sounds normal. No respiratory distress. She has no wheezes. She has  no rales.  Abdominal: Soft. She exhibits no distension. There is tenderness in the right lower quadrant and suprapubic area. There is no rigidity, no rebound, no guarding, no CVA tenderness, no tenderness at McBurney's point and negative Murphy's sign.  Neurological: She is alert.  Skin: Skin is warm and dry.  Psychiatric: She has a normal mood and affect.  Nursing note and vitals reviewed.   ED Course  Procedures (including critical care time) Labs  Review Labs Reviewed  URINALYSIS, ROUTINE W REFLEX MICROSCOPIC (NOT AT [facility]) - Abnormal; Notable for the following:    APPearance CLOUDY (*)    All other components within normal limits  CBC WITH DIFFERENTIAL/PLATELET - Abnormal; Notable for the following:    Platelets 419 (*)    All other components within normal limits  WET PREP, GENITAL  PREGNANCY, URINE  COMPREHENSIVE METABOLIC PANEL  GC/CHLAMYDIA PROBE AMP ([place]) NOT AT [facility]    Imaging Review US Transvaginal Non-ob  01/11/2016  CLINICAL DATA:  One week history of right adnexal pain EXAM: TRANSABDOMINAL AND TRANSVAGINAL ULTRASOUND OF PELVIS TECHNIQUE: Study was performed transabdominally to optimize pelvic field of view evaluation and transvaginally to optimize internal visceral architecture evaluation. COMPARISON:  pelvic ultrasound October 23, 2014; CT abdomen and pelvis October 24, 2014 FINDINGS: Uterus Measurements: 9.2 x 5.7 x 6.0 cm. No fibroids or other mass visualized. Endometrium Thickness: 9 mm.  No focal abnormality visualized. Right ovary Measurements: 3.0 x 2.1 x 2.7 cm. Normal appearance/no adnexal mass. Left ovary Measurements: 5.0 x 4.1 x 4.5 cm. There is a cyst arising from the left ovary measuring 3.0 x 2.2 x 2.5 cm. No other pelvic mass seen. The dilated tubular structure in the left adnexal region seen previously is no longer apparent. Other findings No abnormal free fluid. IMPRESSION: Simple appearing cyst measuring 3.0 x 2.2 x 2.5 cm arising from the left ovary. This is almost certainly benign, and no specific imaging follow up is recommended according to the Society of Radiologists in [place] Statement (D Clovis Riley et al. Management of Asymptomatic Ovarian and Other Adnexal Cysts Imaged at [place]: Society of Radiologists in [place] Statement 2010. Radiology 256 (Sept 2010): [id number].). Study otherwise unremarkable. Electronically Signed   By: Lowella Grip III M.D.    On: 01/11/2016 18:34   US Pelvis Complete  01/11/2016  CLINICAL DATA:  One week history of right adnexal pain EXAM: TRANSABDOMINAL AND TRANSVAGINAL ULTRASOUND OF PELVIS TECHNIQUE: Study was performed transabdominally to optimize pelvic field of view evaluation and transvaginally to optimize internal visceral architecture evaluation. COMPARISON:  pelvic ultrasound October 23, 2014; CT abdomen and pelvis October 24, 2014 FINDINGS: Uterus Measurements: 9.2 x 5.7 x 6.0 cm. No fibroids or other mass visualized. Endometrium Thickness: 9 mm.  No focal abnormality visualized. Right ovary Measurements: 3.0 x 2.1 x 2.7 cm. Normal appearance/no adnexal mass. Left ovary Measurements: 5.0 x 4.1 x 4.5 cm. There is a cyst arising from the left ovary measuring 3.0 x 2.2 x 2.5 cm. No other pelvic mass seen. The dilated tubular structure in the left adnexal region seen previously is no longer apparent. Other findings No abnormal free fluid. IMPRESSION: Simple appearing cyst measuring 3.0 x 2.2 x 2.5 cm arising from the left ovary. This is almost certainly benign, and no specific imaging follow up is recommended according to the Society of Radiologists in [place] Statement (D Clovis Riley et al. Management of Asymptomatic Ovarian and Other Adnexal Cysts Imaged at [place]: Society of Radiologists  in Ultrasound Consensus Conference Statement 2010. Radiology 256 (Sept 2010): [id number].). Study otherwise unremarkable. Electronically Signed   By: Lowella Grip III M.D.   On: 01/11/2016 18:34   I have personally reviewed and evaluated these images and lab results as part of my medical decision-making.  4:34 PM Patient seen and examined. Work-up initiated. Will perform pelvic exam given lower abdominal tenderness.   Vital signs reviewed and are as follows: BP 124/78 mmHg  Pulse 77  Temp(Src) 98.5 F (36.9 C) (Oral)  Resp 16  Ht 5\' 5"  (1.651 m)  Wt 97.523 kg  BMI 35.78 kg/m2  SpO2 99%  LMP  12/25/2015  5:06 PM Pelvic exam performed with RN chaperone. WBC nml. Pt does have R adnexal tenderness, will eval with [place].   7:16 PM Patient left prior to the ultrasound being read because she had to pick up her children. We discussed NSAIDs for pain. Patient has a left-sided ovarian cyst. I called the patient and told her of these results by telephone. Plan is for her to follow-up with her GYN, return to the emergency department with worsening pain, fever, vomiting, or other concerns. She verbalizes understanding and agrees with plan.   MDM   Final diagnoses:  Pelvic pain in female   Patient with right sided abdominal and pelvic pain. Do not suspect infection. Lab work is reassuring. Ultrasound shows left sided ovarian cyst. I do not think that this explains patient's pain. No abnormal fluid collection or other symptoms to suspect appendicitis. Do not suspect torsion given her presentation. Return instructions as above. Conservative measures in the interim until she can follow-up.  Carlisle Cater, PA-C 01/11/16 [place], MD 01/11/16 2312

## 2016-01-12 LAB — GC/CHLAMYDIA PROBE AMP (~~LOC~~) NOT AT ARMC
CHLAMYDIA, DNA PROBE: NEGATIVE
Neisseria Gonorrhea: NEGATIVE

## 2016-08-08 ENCOUNTER — Encounter (HOSPITAL_COMMUNITY): Payer: Self-pay | Admitting: Student

## 2016-08-08 ENCOUNTER — Inpatient Hospital Stay (HOSPITAL_COMMUNITY): Payer: Self-pay

## 2016-08-08 ENCOUNTER — Inpatient Hospital Stay (HOSPITAL_COMMUNITY)
Admission: AD | Admit: 2016-08-08 | Discharge: 2016-08-08 | Disposition: A | Discharge status: Home / Self Care | Payer: Medicaid Other | Source: Ambulatory Visit | Attending: Family Medicine | Admitting: Family Medicine

## 2016-08-08 DIAGNOSIS — A599 Trichomoniasis, unspecified: Secondary | ICD-10-CM

## 2016-08-08 DIAGNOSIS — N74 Female pelvic inflammatory disorders in diseases classified elsewhere: Secondary | ICD-10-CM

## 2016-08-08 DIAGNOSIS — N730 Acute parametritis and pelvic cellulitis: Secondary | ICD-10-CM

## 2016-08-08 DIAGNOSIS — N949 Unspecified condition associated with female genital organs and menstrual cycle: Secondary | ICD-10-CM

## 2016-08-08 DIAGNOSIS — Z87891 Personal history of nicotine dependence: Secondary | ICD-10-CM | POA: Insufficient documentation

## 2016-08-08 DIAGNOSIS — R1032 Left lower quadrant pain: Secondary | ICD-10-CM

## 2016-08-08 DIAGNOSIS — A5901 Trichomonal vulvovaginitis: Secondary | ICD-10-CM | POA: Insufficient documentation

## 2016-08-08 DIAGNOSIS — Z3202 Encounter for pregnancy test, result negative: Secondary | ICD-10-CM | POA: Insufficient documentation

## 2016-08-08 HISTORY — DX: Female pelvic inflammatory disease, unspecified: N73.9

## 2016-08-08 LAB — WET PREP, GENITAL
Sperm: NONE SEEN
Yeast Wet Prep HPF POC: NONE SEEN

## 2016-08-08 LAB — URINALYSIS, ROUTINE W REFLEX MICROSCOPIC
Bacteria, UA: NONE SEEN
Bacteria, UA: NONE SEEN
Bilirubin Urine: NEGATIVE
Bilirubin Urine: NEGATIVE
GLUCOSE, UA: NEGATIVE mg/dL
Glucose, UA: NEGATIVE mg/dL
Hgb urine dipstick: NEGATIVE
Hgb urine dipstick: NEGATIVE
Ketones, ur: NEGATIVE mg/dL
Ketones, ur: NEGATIVE mg/dL
Nitrite: NEGATIVE
Nitrite: NEGATIVE
Protein, ur: 30 mg/dL — AB
Protein, ur: NEGATIVE mg/dL
SPECIFIC GRAVITY, URINE: 1.025 (ref 1.005–1.030)
Specific Gravity, Urine: 1.024 (ref 1.005–1.030)
pH: 5 (ref 5.0–8.0)
pH: 6 (ref 5.0–8.0)

## 2016-08-08 LAB — CBC
HCT: 41.8 % (ref 36.0–46.0)
Hemoglobin: 14.6 g/dL (ref 12.0–15.0)
MCH: 31.1 pg (ref 26.0–34.0)
MCHC: 34.9 g/dL (ref 30.0–36.0)
MCV: 89.1 fL (ref 78.0–100.0)
PLATELETS: 442 10*3/uL — AB (ref 150–400)
RBC: 4.69 MIL/uL (ref 3.87–5.11)
RDW: 13.4 % (ref 11.5–15.5)
WBC: 7.1 10*3/uL (ref 4.0–10.5)

## 2016-08-08 LAB — POCT PREGNANCY, URINE: Preg Test, Ur: NEGATIVE

## 2016-08-08 MED ORDER — CEFTRIAXONE SODIUM 250 MG IJ SOLR
250.0000 mg | Freq: Once | INTRAMUSCULAR | Status: AC
  Administered 2016-08-08: 250 mg via INTRAMUSCULAR
  Filled 2016-08-08: qty 250

## 2016-08-08 MED ORDER — ONDANSETRON 8 MG PO TBDP
8.0000 mg | ORAL_TABLET | Freq: Once | ORAL | Status: AC
  Administered 2016-08-08: 8 mg via ORAL
  Filled 2016-08-08: qty 1

## 2016-08-08 MED ORDER — IBUPROFEN 800 MG PO TABS
800.0000 mg | ORAL_TABLET | Freq: Once | ORAL | Status: AC
  Administered 2016-08-08: 800 mg via ORAL
  Filled 2016-08-08: qty 1

## 2016-08-08 MED ORDER — DOXYCYCLINE HYCLATE 100 MG PO CAPS: 100.0000 mg | ORAL_CAPSULE | Freq: Two times a day (BID) | ORAL | 0 refills | Status: DC

## 2016-08-08 MED ORDER — METRONIDAZOLE 500 MG PO TABS
2000.0000 mg | ORAL_TABLET | Freq: Once | ORAL | Status: AC
  Administered 2016-08-08: 2000 mg via ORAL
  Filled 2016-08-08: qty 4

## 2016-08-08 NOTE — Discharge Instructions (Signed)
Pelvic Inflammatory Disease Introduction Pelvic inflammatory disease (PID) refers to an infection in some or all of the female organs. The infection can be in the uterus, ovaries, fallopian tubes, or the surrounding tissues in the pelvis. PID can cause abdominal or pelvic pain that comes on suddenly (acute pelvic pain). PID is a serious infection because it can lead to lasting (chronic) pelvic pain or the inability to have children (infertility). What are the causes? This condition is most often caused by an infection that is spread during sexual contact. However, the infection can also be caused by the normal bacteria that are found in the vaginal tissues if these bacteria travel upward into the reproductive organs. PID can also occur following:  The birth of a baby.  A miscarriage.  An abortion.  Major pelvic surgery.  The use of an intrauterine device (IUD).  A sexual assault. What increases the risk? This condition is more likely to develop in women who:  Are younger than 28 years of age.  Are sexually active at [facility] age.  Use nonbarrier contraception.  Have multiple sexual partners.  Have sex with someone who has symptoms of an STD (sexually transmitted disease).  Use oral contraception. At times, certain behaviors can also increase the possibility of getting PID, such as:  Using a vaginal douche.  Having an IUD in place. What are the signs or symptoms? Symptoms of this condition include:  Abdominal or pelvic pain.  Fever.  Chills.  Abnormal vaginal discharge.  Abnormal uterine bleeding.  Unusual pain shortly after the end of a menstrual period.  Painful urination.  Pain with sexual intercourse.  Nausea and vomiting. How is this diagnosed? To diagnose this condition, your health care provider will do a physical exam and take your medical history. A pelvic exam typically reveals great tenderness in the uterus and the surrounding pelvic tissues. You may  also have tests, such as:  Lab tests, including a pregnancy test, blood tests, and urine test.  Culture tests of the vagina and cervix to check for an STD.  Ultrasound.  A laparoscopic procedure to look inside the pelvis.  Examining vaginal secretions under a microscope. How is this treated? Treatment for this condition may involve one or more approaches.  Antibiotic medicines may be prescribed to be taken by mouth.  Sexual partners may need to be treated if the infection is caused by an STD.  For more severe cases, hospitalization may be needed to give antibiotics directly into a vein through an IV tube.  Surgery may be needed if other treatments do not help, but this is rare. It may take weeks until you are completely well. If you are diagnosed with PID, you should also be checked for human immunodeficiency virus (HIV). Your health care provider may test you for infection again 3 months after treatment. You should not have unprotected sex. Follow these instructions at home:  Take over-the-counter and prescription medicines only as told by your health care provider.  If you were prescribed an antibiotic medicine, take it as told by your health care provider. Do not stop taking the antibiotic even if you start to feel better.  Do not have sexual intercourse until treatment is completed or as told by your health care provider. If PID is confirmed, your recent sexual partners will need treatment, especially if you had unprotected sex.  Keep all follow-up visits as told by your health care provider. This is important. Contact a health care provider if:  You have  increased or abnormal vaginal discharge.  Your pain does not improve.  You vomit.  You have a fever.  You cannot tolerate your medicines.  Your partner has an STD.  You have pain when you urinate. Get help right away if:  You have increased abdominal or pelvic pain.  You have chills.  Your symptoms are not  better in 72 hours even with treatment. This information is not intended to replace advice given to you by your health care provider. Make sure you discuss any questions you have with your health care provider. Document Released: 08/13/2005 Document Revised: 01/19/2016 Document Reviewed: 09/20/2014  2017 Elsevier   Trichomoniasis Trichomoniasis is an infection caused by an organism called Trichomonas. The infection can affect both women and men. In women, the outer female genitalia and the vagina are affected. In men, the penis is mainly affected, but the prostate and other reproductive organs can also be involved. Trichomoniasis is a sexually transmitted infection (STI) and is most often passed to another person through sexual contact.  RISK FACTORS  Having unprotected sexual intercourse.  Having sexual intercourse with an infected partner. SIGNS AND SYMPTOMS  Symptoms of trichomoniasis in women include:  Abnormal gray-green frothy vaginal discharge.  Itching and irritation of the vagina.  Itching and irritation of the area outside the vagina. Symptoms of trichomoniasis in men include:   Penile discharge with or without pain.  Pain during urination. This results from inflammation of the urethra. DIAGNOSIS  Trichomoniasis may be found during a Pap test or physical exam. Your health care provider may use one of the following methods to help diagnose this infection:  Testing the pH of the vagina with a test tape.  Using a vaginal swab test that checks for the Trichomonas organism. A test is available that provides results within a few minutes.  Examining a urine sample.  Testing vaginal secretions. Your health care provider may test you for other STIs, including HIV. TREATMENT   You may be given medicine to fight the infection. Women should inform their health care provider if they could be or are pregnant. Some medicines used to treat the infection should not be taken during  pregnancy.  Your health care provider may recommend over-the-counter medicines or creams to decrease itching or irritation.  Your sexual partner will need to be treated if infected.  Your health care provider may test you for infection again 3 months after treatment. HOME CARE INSTRUCTIONS   Take medicines only as directed by your health care provider.  Take over-the-counter medicine for itching or irritation as directed by your health care provider.  Do not have sexual intercourse while you have the infection.  Women should not douche or wear tampons while they have the infection.  Discuss your infection with your partner. Your partner may have gotten the infection from you, or you may have gotten it from your partner.  Have your sex partner get examined and treated if necessary.  Practice safe, informed, and protected sex.  See your health care provider for other STI testing. SEEK MEDICAL CARE IF:   You still have symptoms after you finish your medicine.  You develop abdominal pain.  You have pain when you urinate.  You have bleeding after sexual intercourse.  You develop a rash.  Your medicine makes you sick or makes you throw up (vomit). MAKE SURE YOU:  Understand these instructions.  Will watch your condition.  Will get help right away if you are not doing well or  get worse. This information is not intended to replace advice given to you by your health care provider. Make sure you discuss any questions you have with your health care provider. Document Released: 02/06/2001 Document Revised: 09/03/2014 Document Reviewed: 05/25/2013 Elsevier Interactive Patient Education  2017 [employer].

## 2016-08-08 NOTE — MAU Note (Addendum)
Pt C/O L side pain for the last 3-4 days - LLQ also mid L side pain. Pain has been intermittent previously, is now constant.  Having regular BM's.  Has some nausea, denies fever or vomiting.

## 2016-08-08 NOTE — MAU Provider Note (Signed)
History     CSN: [id number]  Arrival date and time: 08/08/16 1301   First Provider Initiated Contact with Patient 08/08/16 1511    Chief Complaint  Patient presents with  . Abdominal Pain   HPI  Karina [name] is a 30 y.o. female who presents with abdominal pain. Desribes abdominal pain in the LLQ that radiates to left side. Symptoms began 4 days ago. Pain was intermittent, but constant today. Describes as achy pressure. Rates pain 10/10. Has not treated. Nothing makes better or worse. Some nausea; no vomiting. Vaginal discharge this week that she describes as white/brown with foul odor; similar to BV she's had in the past. Is sexually active with one partner & normally uses condoms. Denies dyspareunia or postcoital bleeding. Hx of trich & PID with previous sexual partner.  Denies fever/chills, vaginal bleeding, dysuria.   Past Medical History:  Diagnosis Date  . Depression    Postpartum Depression with 1st pregnancy  . Fibroids   . History of chlamydia   . PID (pelvic inflammatory disease)   . Pregnancy induced hypertension   . Trichomonas     Past Surgical History:  Procedure Laterality Date  . CERVICAL CERCLAGE    . DILATION AND CURETTAGE OF UTERUS      Family History  Problem Relation Age of Onset  . Hypertension Mother   . Hypertension Father     Social History  Substance Use Topics  . Smoking status: Former Smoker    Quit date: 08/12/2012  . Smokeless tobacco: Never Used  . Alcohol use No    Allergies: No Known Allergies  Prescriptions Prior to Admission  Medication Sig Dispense Refill Last Dose  . fluconazole (DIFLUCAN) 150 MG tablet Take 1 tablet (150 mg total) by mouth once. (Patient not taking: Reported on 02/22/2015) 1 tablet 0   . lisinopril-hydrochlorothiazide (PRINZIDE,ZESTORETIC) 20-25 MG per tablet Take 1 tablet by mouth daily. 90 tablet 0 Past Week at Unknown time  . metroNIDAZOLE (FLAGYL) 500 MG tablet Take 1 tablet (500 mg total) by mouth 2  (two) times daily. 14 tablet 0 Past Week at Unknown time  . triamterene-hydrochlorothiazide (MAXZIDE) 75-50 MG tablet        Review of Systems  Constitutional: Negative for chills and fever.  Gastrointestinal: Positive for abdominal pain and nausea. Negative for constipation, diarrhea and vomiting.  Genitourinary: Negative for dysuria, flank pain, frequency and hematuria.       + vaginal discharge No vaginal bleeding, dyspareunia, or postcoital bleeding   Physical Exam   Blood pressure 125/90, pulse 71, temperature 97.6 F (36.4 C), temperature source Oral, resp. rate 18, height 5\' 5"  (1.651 m), weight 233 lb (105.7 kg), last menstrual period 07/24/2016.  Physical Exam  Nursing note and vitals reviewed. Constitutional: She is oriented to person, place, and time. She appears well-developed and well-nourished. No distress.  HENT:  Head: Normocephalic and atraumatic.  Eyes: Conjunctivae are normal. Right eye exhibits no discharge. Left eye exhibits no discharge. No scleral icterus.  Neck: Normal range of motion.  Cardiovascular: Normal rate, regular rhythm and normal heart sounds.   No murmur heard. Respiratory: Effort normal and breath sounds normal. No respiratory distress. She has no wheezes.  GI: Soft. Bowel sounds are normal. There is tenderness in the suprapubic area and left lower quadrant. There is no rigidity, no guarding and no CVA tenderness.  Genitourinary: Cervix exhibits motion tenderness. Left adnexum displays tenderness. Vaginal discharge (moderat amount of thin yellow discharge) found.  Neurological: She is  alert and oriented to person, place, and time.  Skin: Skin is warm and dry. She is not diaphoretic.  Psychiatric: She has a normal mood and affect. Her behavior is normal. Judgment and thought content normal.    MAU Course  Procedures Results for orders placed or performed during the hospital encounter of 08/08/16 (from the past 24 hour(s))  Urinalysis, Routine w  reflex microscopic     Status: Abnormal   Collection Time: 08/08/16  1:29 PM  Result Value Ref Range   Color, Urine AMBER (A) YELLOW   APPearance CLOUDY (A) CLEAR   Specific Gravity, Urine 1.024 1.005 - 1.030   pH 5.0 5.0 - 8.0   Glucose, UA NEGATIVE NEGATIVE mg/dL   Hgb urine dipstick NEGATIVE NEGATIVE   Bilirubin Urine NEGATIVE NEGATIVE   Ketones, ur NEGATIVE NEGATIVE mg/dL   Protein, ur 30 (A) NEGATIVE mg/dL   Nitrite NEGATIVE NEGATIVE   Leukocytes, UA LARGE (A) NEGATIVE   RBC / HPF 6-30 0 - 5 RBC/hpf   WBC, UA 6-30 0 - 5 WBC/hpf   Bacteria, UA NONE SEEN NONE SEEN   Squamous Epithelial / LPF TOO NUMEROUS TO COUNT (A) NONE SEEN   Mucous PRESENT   Pregnancy, urine POC     Status: None   Collection Time: 08/08/16  1:36 PM  Result Value Ref Range   Preg Test, Ur NEGATIVE NEGATIVE  Wet prep, genital     Status: Abnormal   Collection Time: 08/08/16  3:28 PM  Result Value Ref Range   Yeast Wet Prep HPF POC NONE SEEN NONE SEEN   Trich, Wet Prep PRESENT (A) NONE SEEN   Clue Cells Wet Prep HPF POC PRESENT (A) NONE SEEN   WBC, Wet Prep HPF POC MODERATE (A) NONE SEEN   Sperm NONE SEEN   Urinalysis, Routine w reflex microscopic     Status: Abnormal   Collection Time: 08/08/16  3:42 PM  Result Value Ref Range   Color, Urine YELLOW YELLOW   APPearance CLEAR CLEAR   Specific Gravity, Urine 1.025 1.005 - 1.030   pH 6.0 5.0 - 8.0   Glucose, UA NEGATIVE NEGATIVE mg/dL   Hgb urine dipstick NEGATIVE NEGATIVE   Bilirubin Urine NEGATIVE NEGATIVE   Ketones, ur NEGATIVE NEGATIVE mg/dL   Protein, ur NEGATIVE NEGATIVE mg/dL   Nitrite NEGATIVE NEGATIVE   Leukocytes, UA SMALL (A) NEGATIVE   RBC / HPF 0-5 0 - 5 RBC/hpf   WBC, UA 0-5 0 - 5 WBC/hpf   Bacteria, UA NONE SEEN NONE SEEN   Squamous Epithelial / LPF 0-5 (A) NONE SEEN   Mucous PRESENT   CBC     Status: Abnormal   Collection Time: 08/08/16  3:56 PM  Result Value Ref Range   WBC 7.1 4.0 - 10.5 K/uL   RBC 4.69 3.87 - 5.11 MIL/uL    Hemoglobin 14.6 12.0 - 15.0 g/dL   HCT 41.8 36.0 - 46.0 %   MCV 89.1 78.0 - 100.0 fL   MCH 31.1 26.0 - 34.0 pg   MCHC 34.9 30.0 - 36.0 g/dL   RDW 13.4 11.5 - 15.5 %   Platelets 442 (H) 150 - 400 K/uL   US Transvaginal Non-ob  Result Date: 08/08/2016 CLINICAL DATA:  Left lower quadrant pain and cervical motion tenderness for 4 days. LMP 07/23/2016. EXAM: TRANSABDOMINAL AND TRANSVAGINAL ULTRASOUND OF PELVIS TECHNIQUE: Both transabdominal and transvaginal ultrasound examinations of the pelvis were performed. Transabdominal technique was performed for global imaging of the pelvis including  uterus, ovaries, adnexal regions, and pelvic cul-de-sac. It was necessary to proceed with endovaginal exam following the transabdominal exam to visualize the endometrium and ovaries. COMPARISON:  01/11/2016 FINDINGS: Uterus Measurements: 10.3 x 5.8 x 6.6 cm. The myometrium has a diffusely heterogeneous echotexture can also show contains multiple tiny cystic foci. This is suspicious for adenomyosis. No discrete myometrial masses are identified. Endometrium Thickness: 13 mm.  No focal abnormality visualized. Right ovary Measurements: 4.6 x 2.9 x 3.9 cm. A complex cyst is seen which contains several thin internal septations and measures 2.6 x 2.5 cm. This has indeterminate but probably benign characteristics, and differential diagnosis includes an atypical hemorrhagic cyst, endometrium, and small cystic ovarian neoplasm. Left ovary Measurements: 2.8 x 2.1 x 2.6 cm transabdominally. Normal appearance/no adnexal mass. Other findings No abnormal free fluid. IMPRESSION: Probable diffuse uterine adenomyosis. No discrete fibroids identified. 2.6 cm complex right ovarian cyst, with indeterminate but probably benign characteristics. Differential diagnosis includes atypical hemorrhagic cyst or endometrioma, and small cystic neoplasm. Recommend followup by transvaginal pelvic ultrasound in 6-12 weeks. This recommendation follows the  consensus statement: Management of Asymptomatic Ovarian and Other Adnexal Cysts Imaged at [place]: Society of Radiologists in [place]. Radiology 2010; [phone]. Electronically Signed   By: Earle Gell M.D.   On: 08/08/2016 17:11   US Pelvis Complete  Result Date: 08/08/2016 CLINICAL DATA:  Left lower quadrant pain and cervical motion tenderness for 4 days. LMP 07/23/2016. EXAM: TRANSABDOMINAL AND TRANSVAGINAL ULTRASOUND OF PELVIS TECHNIQUE: Both transabdominal and transvaginal ultrasound examinations of the pelvis were performed. Transabdominal technique was performed for global imaging of the pelvis including uterus, ovaries, adnexal regions, and pelvic cul-de-sac. It was necessary to proceed with endovaginal exam following the transabdominal exam to visualize the endometrium and ovaries. COMPARISON:  01/11/2016 FINDINGS: Uterus Measurements: 10.3 x 5.8 x 6.6 cm. The myometrium has a diffusely heterogeneous echotexture can also show contains multiple tiny cystic foci. This is suspicious for adenomyosis. No discrete myometrial masses are identified. Endometrium Thickness: 13 mm.  No focal abnormality visualized. Right ovary Measurements: 4.6 x 2.9 x 3.9 cm. A complex cyst is seen which contains several thin internal septations and measures 2.6 x 2.5 cm. This has indeterminate but probably benign characteristics, and differential diagnosis includes an atypical hemorrhagic cyst, endometrium, and small cystic ovarian neoplasm. Left ovary Measurements: 2.8 x 2.1 x 2.6 cm transabdominally. Normal appearance/no adnexal mass. Other findings No abnormal free fluid. IMPRESSION: Probable diffuse uterine adenomyosis. No discrete fibroids identified. 2.6 cm complex right ovarian cyst, with indeterminate but probably benign characteristics. Differential diagnosis includes atypical hemorrhagic cyst or endometrioma, and small cystic neoplasm. Recommend followup by transvaginal pelvic ultrasound  in 6-12 weeks. This recommendation follows the consensus statement: Management of Asymptomatic Ovarian and Other Adnexal Cysts Imaged at [place]: Society of Radiologists in [place]. Radiology 2010; [phone]. Electronically Signed   By: Earle Gell M.D.   On: 08/08/2016 17:11    MDM UPT negative GC/CT & wet prep Ibuprofen 800 mg & zofran 8 mg Wet prep + trich -- flagyl 2 gm PO Will tx for PID based on findings of CMT & WBCs on wet prep -- rocephin 250 mg IM in MAU Ultrasound to r/o TOA Assessment and Plan  A: 1. PID (acute pelvic inflammatory disease)   2. LLQ pain   3. Cervical motion tenderness   4. Trichomonas infection    P: Discharge home Rx doxycycline x 14 days GC/CT pending Expedited partner tx & info sheet  for trich No IC x 2 weeks F/u if symptoms worsen or don't improve  Jorje Guild 08/08/2016, 3:03 PM

## 2016-08-09 LAB — GC/CHLAMYDIA PROBE AMP (~~LOC~~) NOT AT ARMC
CHLAMYDIA, DNA PROBE: POSITIVE — AB
Neisseria Gonorrhea: NEGATIVE

## 2016-08-10 ENCOUNTER — Telehealth: Payer: Self-pay | Admitting: Student

## 2016-08-10 ENCOUNTER — Telehealth (HOSPITAL_COMMUNITY): Payer: Self-pay | Admitting: *Deleted

## 2016-08-10 DIAGNOSIS — A749 Chlamydial infection, unspecified: Secondary | ICD-10-CM

## 2016-08-10 MED ORDER — AZITHROMYCIN 500 MG PO TABS: 1000.0000 mg | ORAL_TABLET | Freq: Once | ORAL | 0 refills | Status: AC

## 2016-08-10 NOTE — Telephone Encounter (Signed)
Pt. Returned call and was already informed of positive results and has gone to pharmacy to obtain medication for treatment.

## 2016-08-10 NOTE — Telephone Encounter (Signed)
Left voicemail for pt to return phone call regarding + CT results. Rx sent to pharmacy. Form send to [facility].

## 2016-08-10 NOTE — Telephone Encounter (Signed)
Left voice mail on pt.s cell phone to call [facility] to discuss lab results.  Message left 12/14 & 12/15.

## 2016-08-10 NOTE — Telephone Encounter (Signed)
Pt returned phone call. Identified by name & DOB. Informed of results & rx at pharmacy. Partner needs to seek treatment. Continue meds previously prescribed. No IC x 1 week after completing tx.

## 2016-08-22 ENCOUNTER — Telehealth: Payer: Self-pay | Admitting: General Practice

## 2016-08-22 ENCOUNTER — Encounter: Payer: Medicaid Other | Admitting: Obstetrics & Gynecology

## 2016-09-04 ENCOUNTER — Encounter: Payer: Medicaid Other | Admitting: Obstetrics and Gynecology

## 2016-09-04 ENCOUNTER — Encounter: Payer: Self-pay | Admitting: Obstetrics and Gynecology

## 2016-09-04 NOTE — Progress Notes (Signed)
Patient did not keep GYN appointment for 09/04/2016.  Durene Romans MD Attending Center for [employer])

## 2016-11-13 ENCOUNTER — Encounter (HOSPITAL_COMMUNITY): Payer: Self-pay | Admitting: Family Medicine

## 2016-11-13 ENCOUNTER — Ambulatory Visit (HOSPITAL_COMMUNITY)
Admission: EM | Admit: 2016-11-13 | Discharge: 2016-11-13 | Disposition: A | Discharge status: Home / Self Care | Payer: Managed Care, Other (non HMO) | Attending: Family Medicine | Admitting: Family Medicine

## 2016-11-13 DIAGNOSIS — N730 Acute parametritis and pelvic cellulitis: Secondary | ICD-10-CM | POA: Insufficient documentation

## 2016-11-13 DIAGNOSIS — N898 Other specified noninflammatory disorders of vagina: Secondary | ICD-10-CM | POA: Diagnosis not present

## 2016-11-13 DIAGNOSIS — Z79899 Other long term (current) drug therapy: Secondary | ICD-10-CM | POA: Insufficient documentation

## 2016-11-13 DIAGNOSIS — R109 Unspecified abdominal pain: Secondary | ICD-10-CM

## 2016-11-13 DIAGNOSIS — Z87891 Personal history of nicotine dependence: Secondary | ICD-10-CM | POA: Diagnosis not present

## 2016-11-13 DIAGNOSIS — R1032 Left lower quadrant pain: Secondary | ICD-10-CM | POA: Diagnosis present

## 2016-11-13 DIAGNOSIS — Z3202 Encounter for pregnancy test, result negative: Secondary | ICD-10-CM

## 2016-11-13 LAB — POCT PREGNANCY, URINE: PREG TEST UR: NEGATIVE

## 2016-11-13 MED ORDER — CEFTRIAXONE SODIUM 250 MG IJ SOLR
INTRAMUSCULAR | Status: AC
  Filled 2016-11-13: qty 250

## 2016-11-13 MED ORDER — METRONIDAZOLE 500 MG PO TABS: 500.0000 mg | ORAL_TABLET | Freq: Two times a day (BID) | ORAL | 0 refills | Status: DC

## 2016-11-13 MED ORDER — IBUPROFEN 800 MG PO TABS: 800.0000 mg | ORAL_TABLET | Freq: Three times a day (TID) | ORAL | 0 refills | Status: DC | PRN

## 2016-11-13 MED ORDER — CEFTRIAXONE SODIUM 250 MG IJ SOLR
250.0000 mg | Freq: Once | INTRAMUSCULAR | Status: AC
  Administered 2016-11-13: 250 mg via INTRAMUSCULAR

## 2016-11-13 MED ORDER — DOXYCYCLINE HYCLATE 100 MG PO CAPS: 100.0000 mg | ORAL_CAPSULE | Freq: Two times a day (BID) | ORAL | 0 refills | Status: DC

## 2016-11-13 MED ORDER — LIDOCAINE HCL (PF) 1 % IJ SOLN
INTRAMUSCULAR | Status: AC
  Filled 2016-11-13: qty 2

## 2016-11-13 NOTE — ED Provider Notes (Signed)
CSN: [id number]     Arrival date & time 11/13/16  93 History   First MD Initiated Contact with Patient 11/13/16 1756     Chief Complaint  Patient presents with  . Abdominal Pain  . Vaginal Discharge   (Consider location/radiation/quality/duration/timing/severity/associated sxs/prior Treatment) Patient c/o lower abdominal pain and vaginal discharge with odor.   The history is provided by the patient.  Abdominal Pain  Pain location:  LLQ Pain quality: aching and cramping   Pain radiates to:  L flank Pain severity:  Moderate Onset quality:  Gradual Duration:  2 days Timing:  Constant Chronicity:  New Relieved by:  Nothing Worsened by:  Nothing Ineffective treatments:  None tried Associated symptoms: vaginal discharge   Vaginal Discharge  Associated symptoms: abdominal pain     Past Medical History:  Diagnosis Date  . Depression    Postpartum Depression with 1st pregnancy  . Fibroids   . History of chlamydia   . PID (pelvic inflammatory disease)   . Pregnancy induced hypertension   . Trichomonas    Past Surgical History:  Procedure Laterality Date  . CERVICAL CERCLAGE    . DILATION AND CURETTAGE OF UTERUS     Family History  Problem Relation Age of Onset  . Hypertension Mother   . Hypertension Father    Social History  Substance Use Topics  . Smoking status: Former Smoker    Quit date: 08/12/2012  . Smokeless tobacco: Never Used  . Alcohol use No   OB History    Gravida Para Term Preterm AB Living   5 2 1 1 3 2    SAB TAB Ectopic Multiple Live Births   3 0 0 0 2     Review of Systems  Constitutional: Negative.   HENT: Negative.   Eyes: Negative.   Respiratory: Negative.   Cardiovascular: Negative.   Gastrointestinal: Positive for abdominal pain.  Endocrine: Negative.   Genitourinary: Positive for vaginal discharge.  Musculoskeletal: Negative.   Skin: Negative.   Allergic/Immunologic: Negative.   Neurological: Negative.   Hematological:  Negative.   Psychiatric/Behavioral: Negative.     Allergies  Patient has no known allergies.  Home Medications   Prior to Admission medications   Medication Sig Start Date End Date Taking? Authorizing Provider  doxycycline (VIBRAMYCIN) 100 MG capsule Take 1 capsule (100 mg total) by mouth 2 (two) times daily. 11/13/16   Lysbeth Penner, FNP  ibuprofen (ADVIL,MOTRIN) 800 MG tablet Take 1 tablet (800 mg total) by mouth every 8 (eight) hours as needed. 11/13/16   Lysbeth Penner, FNP  metroNIDAZOLE (FLAGYL) 500 MG tablet Take 1 tablet (500 mg total) by mouth 2 (two) times daily. 11/13/16   Lysbeth Penner, FNP  triamterene-hydrochlorothiazide [facility]) 75-50 MG tablet  06/27/16   Historical Provider, MD   Meds Ordered and Administered this Visit   Medications  cefTRIAXone (ROCEPHIN) injection 250 mg (250 mg Intramuscular Given 11/13/16 1816)    BP (!) 136/103   Pulse 60   Temp 98.1 F (36.7 C) (Oral)   Resp 18   LMP 10/19/2016   SpO2 100%  No data found.   Physical Exam  Constitutional: She is oriented to person, place, and time. She appears well-developed and well-nourished.  HENT:  Head: Normocephalic and atraumatic.  Right Ear: External ear normal.  Left Ear: External ear normal.  Mouth/Throat: Oropharynx is clear and moist.  Eyes: Conjunctivae and EOM are normal. Pupils are equal, round, and reactive to light.  Neck: Normal range  of motion. Neck supple.  Cardiovascular: Normal rate, regular rhythm and normal heart sounds.   Pulmonary/Chest: Effort normal and breath sounds normal.  Abdominal: Soft. Bowel sounds are normal.  Genitourinary: Vaginal discharge found.  Genitourinary Comments: BUS - wnl Vagina - White vaginal discharge Cervix - Tender Left adnexa tender right adnexa normal  Neurological: She is alert and oriented to person, place, and time.  Nursing note and vitals reviewed.   Urgent Care Course     Procedures (including critical care time)  Labs  Review Labs Reviewed  POCT PREGNANCY, URINE  CERVICOVAGINAL ANCILLARY ONLY    Imaging Review No results found.   Visual Acuity Review  Right Eye Distance:   Left Eye Distance:   Bilateral Distance:    Right Eye Near:   Left Eye Near:    Bilateral Near:         MDM   1. Vaginal discharge   2. PID (acute pelvic inflammatory disease)    Doxycycline 100mg  one po bid x 14 days #28 Flagyl 500mg  one po bid x 14 days #28 Motrin 800mg  one po tid prn #30  Endocervical Cytology GC chlamydia BD affirm     Lysbeth Penner, FNP 11/13/16 2108

## 2016-11-13 NOTE — ED Triage Notes (Signed)
Pt here for left lower abd pain and vaginal discharge with odor.

## 2016-11-14 LAB — CERVICOVAGINAL ANCILLARY ONLY
Chlamydia: NEGATIVE
Neisseria Gonorrhea: NEGATIVE
Wet Prep (BD Affirm): POSITIVE — AB

## 2016-12-14 ENCOUNTER — Other Ambulatory Visit (HOSPITAL_COMMUNITY)
Admission: RE | Admit: 2016-12-14 | Discharge: 2016-12-14 | Disposition: A | Discharge status: Home / Self Care | Payer: Managed Care, Other (non HMO) | Source: Ambulatory Visit | Attending: Obstetrics and Gynecology | Admitting: Obstetrics and Gynecology

## 2016-12-14 ENCOUNTER — Other Ambulatory Visit: Payer: Self-pay | Admitting: Obstetrics and Gynecology

## 2016-12-14 DIAGNOSIS — Z01419 Encounter for gynecological examination (general) (routine) without abnormal findings: Secondary | ICD-10-CM | POA: Diagnosis present

## 2016-12-19 LAB — CYTOLOGY - PAP: DIAGNOSIS: NEGATIVE

## 2017-10-15 ENCOUNTER — Inpatient Hospital Stay (HOSPITAL_COMMUNITY)
Admission: AD | Admit: 2017-10-15 | Discharge: 2017-10-15 | Disposition: A | Discharge status: Home / Self Care | Payer: Managed Care, Other (non HMO) | Source: Ambulatory Visit | Attending: Obstetrics & Gynecology | Admitting: Obstetrics & Gynecology

## 2017-10-15 ENCOUNTER — Other Ambulatory Visit: Payer: Self-pay

## 2017-10-15 DIAGNOSIS — E669 Obesity, unspecified: Secondary | ICD-10-CM | POA: Insufficient documentation

## 2017-10-15 DIAGNOSIS — Z6841 Body Mass Index (BMI) 40.0 and over, adult: Secondary | ICD-10-CM | POA: Insufficient documentation

## 2017-10-15 DIAGNOSIS — G43019 Migraine without aura, intractable, without status migrainosus: Secondary | ICD-10-CM | POA: Insufficient documentation

## 2017-10-15 DIAGNOSIS — R109 Unspecified abdominal pain: Secondary | ICD-10-CM | POA: Insufficient documentation

## 2017-10-15 DIAGNOSIS — I10 Essential (primary) hypertension: Secondary | ICD-10-CM

## 2017-10-15 DIAGNOSIS — R110 Nausea: Secondary | ICD-10-CM | POA: Insufficient documentation

## 2017-10-15 DIAGNOSIS — Z87891 Personal history of nicotine dependence: Secondary | ICD-10-CM | POA: Insufficient documentation

## 2017-10-15 LAB — URINALYSIS, ROUTINE W REFLEX MICROSCOPIC
Bacteria, UA: NONE SEEN
Bilirubin Urine: NEGATIVE
Glucose, UA: NEGATIVE mg/dL
Hgb urine dipstick: NEGATIVE
Ketones, ur: NEGATIVE mg/dL
Nitrite: NEGATIVE
Protein, ur: NEGATIVE mg/dL
Specific Gravity, Urine: 1.018 (ref 1.005–1.030)
pH: 7 (ref 5.0–8.0)

## 2017-10-15 LAB — POCT PREGNANCY, URINE: Preg Test, Ur: NEGATIVE

## 2017-10-15 MED ORDER — DIPHENHYDRAMINE HCL 25 MG PO CAPS: 25.0000 mg | ORAL_CAPSULE | Freq: Once | ORAL | Status: DC

## 2017-10-15 MED ORDER — METOCLOPRAMIDE HCL 10 MG PO TABS: 10.0000 mg | ORAL_TABLET | Freq: Once | ORAL | Status: DC

## 2017-10-15 MED ORDER — BUTALBITAL-APAP-CAFFEINE 50-325-40 MG PO TABS: 1.0000 | ORAL_TABLET | Freq: Four times a day (QID) | ORAL | 0 refills | Status: AC | PRN

## 2017-10-15 MED ORDER — ACETAMINOPHEN 500 MG PO TABS
1000.0000 mg | ORAL_TABLET | Freq: Once | ORAL | Status: AC
Start: 2017-10-15 — End: 2017-10-15
  Administered 2017-10-15: 1000 mg via ORAL
  Filled 2017-10-15: qty 2

## 2017-10-15 MED ORDER — ONDANSETRON 8 MG PO TBDP
8.0000 mg | ORAL_TABLET | Freq: Once | ORAL | Status: AC
  Administered 2017-10-15: 8 mg via ORAL
  Filled 2017-10-15: qty 1

## 2017-10-15 MED ORDER — DEXAMETHASONE SODIUM PHOSPHATE 10 MG/ML IJ SOLN: 10.0000 mg | Freq: Once | INTRAMUSCULAR | Status: DC

## 2017-10-15 NOTE — MAU Note (Signed)
Pt presents with c/o headache since Sunday, unrelieved with Tylenol.  Pt also reports no period, 2 negative UPT's  @ home.  LMP 09/07/2017.  Reports having abdominal cramping & nausea

## 2017-10-15 NOTE — Discharge Instructions (Signed)
In late 2019, the [facility] will be moving to the [place] campus. At that time, the MAU (Maternity Admissions Unit), where you are being seen today, will no longer take care of non-pregnant patients. We strongly encourage you to find a doctor's office before that time, so that you can be seen with any GYN concerns, like vaginal discharge, urinary tract infection, etc.. in a timely manner. ° °In order to make an office visit more convenient, the [facility] will be offering evening hours with same-day appointments, walk-in appointments and scheduled appointments available during this time. ° °[facility] Hours: °Monday - 8am - 7:30 pm with walk-in between 4pm- 7:30 pm °Tuesday - 8 am - 5 pm (starting 11/26/17 we will be open late and accepting walk-ins from 4pm - 7:30pm) °Wednesday - 8 am - 5 pm (starting 02/26/18 we will be open late and accepting walk-ins from 4pm - 7:30pm) °Thursday 8 am - 5 pm (starting 05/29/18 we will be open late and accepting walk-ins from 4pm - 7:30pm) °Friday 8 am - 5 pm ° °For an appointment please call the [facility] at [phone] ° °For urgent needs, [facility] is also available for management of urgent GYN complaints such as vaginal discharge or urinary tract infections. ° ° ° ° ° °

## 2017-10-15 NOTE — MAU Provider Note (Signed)
Chief Complaint: Nausea; Abdominal Pain; Possible Pregnancy; and Headache   First Provider Initiated Contact with Patient 10/15/17 0837      SUBJECTIVE HPI: Karina [name] is a 32 y.o. [id number] not currently pregnant who presents to maternity admissions reporting migraine, abdominal pain, nausea, and possible pregnancy. She reports hx of hypertension and switching herself to labetalol that was prescribed during pregnancy due to losing her job and not have insurance or the finances to pay for primary medication right now. She reports taking 200mg  Labetalol this morning at 0600. She reports having a headache since Sunday after switching medication, rates HA 8/10. Has taken tylenol with no relief of HA reports light sensitivity with headache. Headache is associated with nausea. She denies vomiting with nausea and able to keep down food and medication. She reports abdominal cramping started yesterday. Reports LMP 1/12 and she was suppose to start her cycle this past Saturday. She reports having a history of irregular periods. She denies vaginal bleeding, vaginal itching/burning, urinary symptoms, dizziness, or fever/chills.    Past Medical History:  Diagnosis Date  . Depression    Postpartum Depression with 1st pregnancy  . Fibroids   . History of chlamydia   . PID (pelvic inflammatory disease)   . Pregnancy induced hypertension   . Trichomonas    Past Surgical History:  Procedure Laterality Date  . CERVICAL CERCLAGE    . DILATION AND CURETTAGE OF UTERUS     Social History   Socioeconomic History  . Marital status: Single    Spouse name: Not on file  . Number of children: Not on file  . Years of education: Not on file  . Highest education level: Not on file  Social Needs  . Financial resource strain: Not on file  . Food insecurity - worry: Not on file  . Food insecurity - inability: Not on file  . Transportation needs - medical: Not on file  . Transportation needs - non-medical: Not  on file  Occupational History  . Not on file  Tobacco Use  . Smoking status: Former Smoker    Last attempt to quit: 08/12/2012    Years since quitting: 5.1  . Smokeless tobacco: Never Used  Substance and Sexual Activity  . Alcohol use: No  . Drug use: No  . Sexual activity: Yes    Birth control/protection: Condom  Other Topics Concern  . Not on file  Social History Narrative  . Not on file   No current facility-administered medications on file prior to encounter.    Current Outpatient Medications on File Prior to Encounter  Medication Sig Dispense Refill  . doxycycline (VIBRAMYCIN) 100 MG capsule Take 1 capsule (100 mg total) by mouth 2 (two) times daily. 28 capsule 0  . ibuprofen (ADVIL,MOTRIN) 800 MG tablet Take 1 tablet (800 mg total) by mouth every 8 (eight) hours as needed. 21 tablet 0  . metroNIDAZOLE (FLAGYL) 500 MG tablet Take 1 tablet (500 mg total) by mouth 2 (two) times daily. 28 tablet 0  . triamterene-hydrochlorothiazide (MAXZIDE) 75-50 MG tablet      No Known Allergies  ROS:  Review of Systems  Constitutional: Negative.   Respiratory: Negative.   Cardiovascular: Negative.   Gastrointestinal: Positive for abdominal pain and nausea. Negative for constipation, diarrhea and vomiting.  Musculoskeletal: Negative.   Neurological: Positive for headaches. Negative for dizziness, weakness and light-headedness.  Psychiatric/Behavioral: Negative.    I have reviewed patient's Past Medical Hx, Surgical Hx, Family Hx, Social Hx, medications and  allergies.   Physical Exam   Patient Vitals for the past 24 hrs:  BP Temp Temp src Pulse SpO2 Height Weight  10/15/17 1010 (!) 160/118 97.7 F (36.5 C) Oral 63 100 % - -  10/15/17 0835 (!) 148/110 - - 72 - - -  10/15/17 0815 (!) 153/109 98.9 F (37.2 C) Oral 73 100 % 5\' 5"  (1.651 m) 241 lb 4 oz (109.4 kg)   Constitutional: Well-developed, obese female in no acute distress.  Cardiovascular: normal rate Respiratory: normal  effort GI: Abd soft, non-tender. Pos BS x 4 MS: Extremities nontender, no edema, normal ROM Neurologic: Alert and oriented x 4.  GU: Neg CVAT. PELVIC EXAM: deferred  LAB RESULTS Results for orders placed or performed during the hospital encounter of 10/15/17 (from the past 24 hour(s))  Urinalysis, Routine w reflex microscopic     Status: Abnormal   Collection Time: 10/15/17  8:07 AM  Result Value Ref Range   Color, Urine YELLOW YELLOW   APPearance CLEAR CLEAR   Specific Gravity, Urine 1.018 1.005 - 1.030   pH 7.0 5.0 - 8.0   Glucose, UA NEGATIVE NEGATIVE mg/dL   Hgb urine dipstick NEGATIVE NEGATIVE   Bilirubin Urine NEGATIVE NEGATIVE   Ketones, ur NEGATIVE NEGATIVE mg/dL   Protein, ur NEGATIVE NEGATIVE mg/dL   Nitrite NEGATIVE NEGATIVE   Leukocytes, UA TRACE (A) NEGATIVE   RBC / HPF 0-5 0 - 5 RBC/hpf   WBC, UA 6-30 0 - 5 WBC/hpf   Bacteria, UA NONE SEEN NONE SEEN   Squamous Epithelial / LPF 0-5 (A) NONE SEEN   Mucus PRESENT   Pregnancy, urine POC     Status: None   Collection Time: 10/15/17  8:43 AM  Result Value Ref Range   Preg Test, Ur NEGATIVE NEGATIVE    MAU Management/MDM: Orders Placed This Encounter  Procedures  . Urinalysis, Routine w reflex microscopic  . Pregnancy, urine POC  UPT-neg  UA- neg   Meds ordered this encounter  Medications  . DISCONTD: metoCLOPramide (REGLAN) tablet 10 mg  . DISCONTD: diphenhydrAMINE (BENADRYL) capsule 25 mg  . DISCONTD: dexamethasone (DECADRON) injection 10 mg  . acetaminophen (TYLENOL) tablet 1,000 mg  . ondansetron (ZOFRAN-ODT) disintegrating tablet 8 mg  . butalbital-acetaminophen-caffeine (FIORICET, ESGIC) 50-325-40 MG tablet    Sig: Take 1 tablet by mouth every 6 (six) hours as needed for migraine.    Dispense:  20 tablet    Refill:  0    Order Specific Question:   Supervising Provider    Answer:   Verita Schneiders A [[id number]]   She reports driving to hospital this morning. Unable to find ride to pick her up. Unable to  given migraine cocktail or Fiorcet in MAU. Treatments in MAU included 1,000mg  Tylenol for headache and 8mg  ODT Zofran for nausea.   Discussed importance of taking medication that was prescribed for hypertension as scheduled. Pt starts new job next week and will have insurance to get medications needed then. Discussed getting enough medication for hypertension for one week instead of complete bottle of pills. Pt called pharmacy to have enough medication filled for two weeks. Will pick up medication with headache medicine.   After treatment with medication. Pt reports HA down to 3/10.    Pt discharged with strict hypertension precautions. Pt stable at time of discharge.   ASSESSMENT 1. Chronic hypertension   2. Intractable migraine without aura and without status migrainosus   3. Nausea     PLAN Discharge home. Pt  stable at time of discharge. Follow up as scheduled with PCP for management of hypertension  Rx for Fioricet for headaches  Return to urgent care or [facility] for worsening symptoms.   Allergies as of 10/15/2017   No Known Allergies     Medication List    STOP taking these medications   doxycycline 100 MG capsule Commonly known as:  VIBRAMYCIN   metroNIDAZOLE 500 MG tablet Commonly known as:  FLAGYL     TAKE these medications   acetaminophen 500 MG tablet Commonly known as:  TYLENOL Take 1,000 mg by mouth every 6 (six) hours as needed for mild pain or headache.   BLISOVI FE 1.5/30 1.5-30 MG-MCG tablet Generic drug:  norethindrone-ethinyl estradiol-iron Take 1 tablet by mouth daily as needed (bleeding).   butalbital-acetaminophen-caffeine 50-325-40 MG tablet Commonly known as:  FIORICET, ESGIC Take 1 tablet by mouth every 6 (six) hours as needed for migraine.   labetalol 100 MG tablet Commonly known as:  NORMODYNE Take 100 mg by mouth 3 (three) times daily.   metoprolol tartrate 100 MG tablet Commonly known as:  LOPRESSOR Take 100 mg by mouth 2 (two) times  daily.      Darrol Poke  Certified Nurse-Midwife 10/15/2017  4:14 PM

## 2018-04-23 ENCOUNTER — Encounter (HOSPITAL_BASED_OUTPATIENT_CLINIC_OR_DEPARTMENT_OTHER): Payer: Self-pay | Admitting: *Deleted

## 2018-04-23 ENCOUNTER — Emergency Department (HOSPITAL_BASED_OUTPATIENT_CLINIC_OR_DEPARTMENT_OTHER)
Admission: EM | Admit: 2018-04-23 | Discharge: 2018-04-23 | Disposition: A | Discharge status: Home / Self Care | Payer: Self-pay | Attending: Emergency Medicine | Admitting: Emergency Medicine

## 2018-04-23 DIAGNOSIS — R102 Pelvic and perineal pain: Secondary | ICD-10-CM | POA: Insufficient documentation

## 2018-04-23 DIAGNOSIS — Z5321 Procedure and treatment not carried out due to patient leaving prior to being seen by health care provider: Secondary | ICD-10-CM | POA: Insufficient documentation

## 2018-04-23 LAB — URINALYSIS, ROUTINE W REFLEX MICROSCOPIC
Bilirubin Urine: NEGATIVE
Glucose, UA: NEGATIVE mg/dL
Hgb urine dipstick: NEGATIVE
Ketones, ur: NEGATIVE mg/dL
Leukocytes, UA: NEGATIVE
Nitrite: NEGATIVE
Protein, ur: NEGATIVE mg/dL
Specific Gravity, Urine: 1.02 (ref 1.005–1.030)
pH: 6.5 (ref 5.0–8.0)

## 2018-04-23 LAB — PREGNANCY, URINE: Preg Test, Ur: NEGATIVE

## 2018-04-23 NOTE — ED Triage Notes (Signed)
Pt co pelvic pain with n/v x 8 hrs

## 2018-04-25 NOTE — ED Notes (Signed)
Follow up call made  Pt will return if needed  04/25/18  0904  s Mitzie Marlar rn

## 2018-05-16 LAB — OB RESULTS CONSOLE HEPATITIS B SURFACE ANTIGEN: Hepatitis B Surface Ag: NEGATIVE

## 2018-05-16 LAB — OB RESULTS CONSOLE HIV ANTIBODY (ROUTINE TESTING): HIV: NONREACTIVE

## 2018-05-16 LAB — OB RESULTS CONSOLE RUBELLA ANTIBODY, IGM: Rubella: IMMUNE

## 2018-06-19 ENCOUNTER — Other Ambulatory Visit: Payer: Self-pay | Admitting: Obstetrics and Gynecology

## 2018-06-19 ENCOUNTER — Other Ambulatory Visit (HOSPITAL_COMMUNITY)
Admission: RE | Admit: 2018-06-19 | Discharge: 2018-06-19 | Disposition: A | Discharge status: Home / Self Care | Payer: 59 | Source: Ambulatory Visit | Attending: Obstetrics and Gynecology | Admitting: Obstetrics and Gynecology

## 2018-06-19 DIAGNOSIS — Z01411 Encounter for gynecological examination (general) (routine) with abnormal findings: Secondary | ICD-10-CM | POA: Insufficient documentation

## 2018-06-23 LAB — CYTOLOGY - PAP
Diagnosis: NEGATIVE
HPV (WINDOPATH): NOT DETECTED

## 2018-06-24 ENCOUNTER — Encounter (HOSPITAL_BASED_OUTPATIENT_CLINIC_OR_DEPARTMENT_OTHER): Payer: Self-pay

## 2018-06-24 ENCOUNTER — Other Ambulatory Visit: Payer: Self-pay

## 2018-06-24 ENCOUNTER — Emergency Department (HOSPITAL_BASED_OUTPATIENT_CLINIC_OR_DEPARTMENT_OTHER)
Admission: EM | Admit: 2018-06-24 | Discharge: 2018-06-24 | Disposition: A | Discharge status: Home / Self Care | Payer: 59 | Attending: Emergency Medicine | Admitting: Emergency Medicine

## 2018-06-24 DIAGNOSIS — Z87891 Personal history of nicotine dependence: Secondary | ICD-10-CM | POA: Insufficient documentation

## 2018-06-24 DIAGNOSIS — O219 Vomiting of pregnancy, unspecified: Secondary | ICD-10-CM | POA: Diagnosis present

## 2018-06-24 DIAGNOSIS — Z3A12 12 weeks gestation of pregnancy: Secondary | ICD-10-CM | POA: Insufficient documentation

## 2018-06-24 DIAGNOSIS — O10011 Pre-existing essential hypertension complicating pregnancy, first trimester: Secondary | ICD-10-CM | POA: Diagnosis not present

## 2018-06-24 DIAGNOSIS — O210 Mild hyperemesis gravidarum: Secondary | ICD-10-CM | POA: Diagnosis not present

## 2018-06-24 LAB — COMPREHENSIVE METABOLIC PANEL
ALT: 20 U/L (ref 0–44)
ANION GAP: 8 (ref 5–15)
AST: 28 U/L (ref 15–41)
Albumin: 3.6 g/dL (ref 3.5–5.0)
Alkaline Phosphatase: 65 U/L (ref 38–126)
BILIRUBIN TOTAL: 0.4 mg/dL (ref 0.3–1.2)
BUN: 7 mg/dL (ref 6–20)
CHLORIDE: 103 mmol/L (ref 98–111)
CO2: 25 mmol/L (ref 22–32)
Calcium: 9.1 mg/dL (ref 8.9–10.3)
Creatinine, Ser: 0.6 mg/dL (ref 0.44–1.00)
GFR calc Af Amer: >60 mL/min (ref >60)
Glucose, Bld: 84 mg/dL (ref 70–99)
POTASSIUM: 3.3 mmol/L — AB (ref 3.5–5.1)
Sodium: 136 mmol/L (ref 135–145)
TOTAL PROTEIN: 6.9 g/dL (ref 6.5–8.1)

## 2018-06-24 LAB — URINALYSIS, ROUTINE W REFLEX MICROSCOPIC
Bilirubin Urine: NEGATIVE
Glucose, UA: NEGATIVE mg/dL
HGB URINE DIPSTICK: NEGATIVE
Ketones, ur: 15 mg/dL — AB
LEUKOCYTES UA: NEGATIVE
Nitrite: NEGATIVE
Protein, ur: NEGATIVE mg/dL
SPECIFIC GRAVITY, URINE: 1.015 (ref 1.005–1.030)
pH: 7.5 (ref 5.0–8.0)

## 2018-06-24 LAB — CBC WITH DIFFERENTIAL/PLATELET
Abs Immature Granulocytes: 0.02 10*3/uL (ref 0.00–0.07)
BASOS PCT: 0 %
Basophils Absolute: 0 10*3/uL (ref 0.0–0.1)
EOS ABS: 0.2 10*3/uL (ref 0.0–0.5)
EOS PCT: 2 %
HEMATOCRIT: 38.8 % (ref 36.0–46.0)
Hemoglobin: 13.3 g/dL (ref 12.0–15.0)
IMMATURE GRANULOCYTES: 0 %
LYMPHS ABS: 1.5 10*3/uL (ref 0.7–4.0)
Lymphocytes Relative: 23 %
MCH: 30.9 pg (ref 26.0–34.0)
MCHC: 34.3 g/dL (ref 30.0–36.0)
MCV: 90.2 fL (ref 80.0–100.0)
MONOS PCT: 10 %
Monocytes Absolute: 0.6 10*3/uL (ref 0.1–1.0)
NEUTROS PCT: 65 %
Neutro Abs: 4.3 10*3/uL (ref 1.7–7.7)
PLATELETS: 352 10*3/uL (ref 150–400)
RBC: 4.3 MIL/uL (ref 3.87–5.11)
RDW: 12.8 % (ref 11.5–15.5)
WBC: 6.6 10*3/uL (ref 4.0–10.5)
nRBC: 0 % (ref 0.0–0.2)

## 2018-06-24 LAB — LIPASE, BLOOD: Lipase: 39 U/L (ref 11–51)

## 2018-06-24 LAB — PREGNANCY, URINE: PREG TEST UR: POSITIVE — AB

## 2018-06-24 MED ORDER — SODIUM CHLORIDE 0.9 % IV BOLUS
1000.0000 mL | Freq: Once | INTRAVENOUS | Status: AC
  Administered 2018-06-24: 1000 mL via INTRAVENOUS

## 2018-06-24 MED ORDER — METOCLOPRAMIDE HCL 5 MG/ML IJ SOLN
10.0000 mg | Freq: Once | INTRAMUSCULAR | Status: AC
  Administered 2018-06-24: 10 mg via INTRAVENOUS
  Filled 2018-06-24: qty 2

## 2018-06-24 MED ORDER — METOCLOPRAMIDE HCL 10 MG PO TABS: 10.0000 mg | ORAL_TABLET | Freq: Four times a day (QID) | ORAL | 0 refills | Status: DC | PRN

## 2018-06-24 MED ORDER — DIPHENHYDRAMINE HCL 50 MG/ML IJ SOLN
25.0000 mg | Freq: Once | INTRAMUSCULAR | Status: AC
  Administered 2018-06-24: 25 mg via INTRAVENOUS
  Filled 2018-06-24: qty 1

## 2018-06-24 MED FILL — METOCLOPRAMIDE 10 MG TABLET: 10 | 2 days supply | Qty: 6 | Fill #0

## 2018-06-24 NOTE — ED Provider Notes (Signed)
[place] EMERGENCY DEPARTMENT Provider Note   CSN: [id number] Arrival date & time: 06/24/18  1229     History   Chief Complaint Chief Complaint  Patient presents with  . Abdominal Pain    [redacted] weeks pregnant    HPI Karina [name] is a 32 y.o. female.  32 yo F with a chief complaint of nausea and vomiting.  Going on for the past couple days.  Patient is a gravida 6 para 2.  Has had morning sickness with all of her prior pregnancies.  She is approximately [redacted] weeks pregnant.  Has had a prior ultrasound had about 8 weeks that showed an IUP. Recently she also was diagnosed with trichomonas, she has been having trouble taking the Flagyl.  She feels that is been making her sick.  Denies abdominal or pelvic pain but feels that her abdomen cramps from time to time from the nausea.  The history is provided by the patient.  Illness  This is a new problem. The current episode started 2 days ago. The problem occurs constantly. The problem has been gradually worsening. Pertinent negatives include no chest pain, no abdominal pain, no headaches and no shortness of breath. Nothing aggravates the symptoms. Nothing relieves the symptoms. She has tried nothing for the symptoms. The treatment provided no relief.    Past Medical History:  Diagnosis Date  . Depression    Postpartum Depression with 1st pregnancy  . Fibroids   . History of chlamydia   . PID (pelvic inflammatory disease)   . Pregnancy induced hypertension   . Trichomonas     Patient Active Problem List   Diagnosis Date Noted  . Hypertension 11/22/2014  . Pelvic inflammatory disease (PID) 10/25/2014    Past Surgical History:  Procedure Laterality Date  . CERVICAL CERCLAGE    . DILATION AND CURETTAGE OF UTERUS       OB History    Gravida  6   Para  2   Term  1   Preterm  1   AB  3   Living  2     SAB  3   TAB  0   Ectopic  0   Multiple  0   Live Births  2            Home Medications      Prior to Admission medications   Medication Sig Start Date End Date Taking? Authorizing Provider  acetaminophen (TYLENOL) 500 MG tablet Take 1,000 mg by mouth every 6 (six) hours as needed for mild pain or headache.    [provider]  butalbital-acetaminophen-caffeine (FIORICET, ESGIC) 50-325-40 MG tablet Take 1 tablet by mouth every 6 (six) hours as needed for migraine. 10/15/17 10/15/18  Lajean Manes, CNM  labetalol (NORMODYNE) 100 MG tablet Take 100 mg by mouth 3 (three) times daily.    [provider]  metoCLOPramide (REGLAN) 10 MG tablet Take 1 tablet (10 mg total) by mouth every 6 (six) hours as needed for nausea (nausea/headache). 06/24/18   Deno Etienne, DO  metoprolol tartrate (LOPRESSOR) 100 MG tablet Take 100 mg by mouth 2 (two) times daily.    [provider]  norethindrone-ethinyl estradiol-iron (BLISOVI FE 1.5/30) 1.5-30 MG-MCG tablet Take 1 tablet by mouth daily as needed (bleeding).    [provider]    Family History Family History  Problem Relation Age of Onset  . Hypertension Mother   . Hypertension Father     Social History Social  History   Tobacco Use  . Smoking status: Former Smoker    Last attempt to quit: 08/12/2012    Years since quitting: 5.8  . Smokeless tobacco: Never Used  Substance Use Topics  . Alcohol use: No  . Drug use: No     Allergies   Patient has no known allergies.   Review of Systems Review of Systems  Constitutional: Negative for chills and fever.  HENT: Negative for congestion and rhinorrhea.   Eyes: Negative for redness and visual disturbance.  Respiratory: Negative for shortness of breath and wheezing.   Cardiovascular: Negative for chest pain and palpitations.  Gastrointestinal: Positive for nausea and vomiting. Negative for abdominal pain.  Genitourinary: Negative for dysuria and urgency.  Musculoskeletal: Negative for arthralgias and myalgias.  Skin: Negative for pallor and wound.   Neurological: Negative for dizziness and headaches.     Physical Exam Updated Vital Signs BP (!) 134/94 (BP Location: Left Arm)   Pulse 87   Temp 97.9 F (36.6 C) (Oral)   Resp 16   Ht 5\' 5"  (1.651 m)   Wt 97.1 kg   LMP 04/04/2018   SpO2 98%   BMI 35.61 kg/m   Physical Exam  Constitutional: She is oriented to person, place, and time. She appears well-developed and well-nourished. No distress.  HENT:  Head: Normocephalic and atraumatic.  Eyes: Pupils are equal, round, and reactive to light. EOM are normal.  Neck: Normal range of motion. Neck supple.  Cardiovascular: Normal rate and regular rhythm. Exam reveals no gallop and no friction rub.  No murmur heard. Pulmonary/Chest: Effort normal. She has no wheezes. She has no rales.  Abdominal: Soft. She exhibits no distension. There is tenderness (mild suprapubic).  Musculoskeletal: She exhibits no edema or tenderness.  Neurological: She is alert and oriented to person, place, and time.  Skin: Skin is warm and dry. She is not diaphoretic.  Psychiatric: She has a normal mood and affect. Her behavior is normal.  Nursing note and vitals reviewed.    ED Treatments / Results  Labs (all labs ordered are listed, but only abnormal results are displayed) Labs Reviewed  COMPREHENSIVE METABOLIC PANEL - Abnormal; Notable for the following components:      Result Value   Potassium 3.3 (*)    All other components within normal limits  URINALYSIS, ROUTINE W REFLEX MICROSCOPIC - Abnormal; Notable for the following components:   Ketones, ur 15 (*)    All other components within normal limits  PREGNANCY, URINE - Abnormal; Notable for the following components:   Preg Test, Ur POSITIVE (*)    All other components within normal limits  CBC WITH DIFFERENTIAL/PLATELET  LIPASE, BLOOD    EKG None  Radiology No results found.  Procedures Procedures (including critical care time) EMERGENCY DEPARTMENT [place] PREGNANCY "Study: Limited  Ultrasound of the Pelvis"  INDICATIONS:Pregnancy(required) and Abdominal or pelvic pain Multiple views of the uterus and pelvic cavity are obtained with a multi-frequency probe.  APPROACH:Transabdominal   PERFORMED BY: Myself  IMAGES ARCHIVED?: Yes  LIMITATIONS: none  PREGNANCY FREE FLUID: None  PREGNANCY UTERUS FINDINGS:Uterus enlarged and Gestational sac noted ADNEXAL FINDINGS:Left ovary not seen and Right ovary not seen  PREGNANCY FINDINGS: Intrauterine gestational sac noted and Fetal heart activity seen  INTERPRETATION: Intrauterine gestational sac noted, Fetal pole present and Fetal heart activity seen  GESTATIONAL AGE, ESTIMATE: 12wk 3 days  Medications Ordered in ED Medications  sodium chloride 0.9 % bolus 1,000 mL (0 mLs Intravenous Stopped 06/24/18 1523)  metoCLOPramide (REGLAN) injection 10 mg (10 mg Intravenous Given 06/24/18 1424)  diphenhydrAMINE (BENADRYL) injection 25 mg (25 mg Intravenous Given 06/24/18 1424)  metoCLOPramide (REGLAN) injection 10 mg (10 mg Intravenous Given 06/24/18 1538)     Initial Impression / Assessment and Plan / ED Course  I have reviewed the triage vital signs and the nursing notes.  Pertinent labs & imaging results that were available during my care of the patient were reviewed by me and considered in my medical decision making (see chart for details).     32 yo F with a chief complaints of nausea and vomiting.  Going on for the past couple days.  Having some abdominal cramping with it.  She is gravida 6 para 2.  Has had multiple miscarriages in the past.  Denies any vaginal bleeding or pelvic pain.  She is supposed to be taking Flagyl for trichomonas however is been having difficulty keeping it down.  Bedside ultrasound with IUP about 12 weeks and 3 days.  Potassium is very mildly low at 3.3.  Patient reassessed with some improvement of her nausea.  Tolerating PO, UA negative for infection.  Offered 2g vs 500mg  bid for trich, she  elects for longer therapy.  Given instructions for how to make diclegis at home.  Short course of reglan.  OB follow up.   3:59 PM:  I have discussed the diagnosis/risks/treatment options with the patient and family and believe the pt to be eligible for discharge home to follow-up with OB. We also discussed returning to the ED immediately if new or worsening sx occur. We discussed the sx which are most concerning (e.g., sudden worsening pain, fever, inability to tolerate by mouth) that necessitate immediate return. Medications administered to the patient during their visit and any new prescriptions provided to the patient are listed below.  Medications given during this visit Medications  sodium chloride 0.9 % bolus 1,000 mL (0 mLs Intravenous Stopped 06/24/18 1523)  metoCLOPramide (REGLAN) injection 10 mg (10 mg Intravenous Given 06/24/18 1424)  diphenhydrAMINE (BENADRYL) injection 25 mg (25 mg Intravenous Given 06/24/18 1424)  metoCLOPramide (REGLAN) injection 10 mg (10 mg Intravenous Given 06/24/18 1538)      The patient appears reasonably screen and/or stabilized for discharge and I doubt any other medical condition or other [facility] requiring further screening, evaluation, or treatment in the ED at this time prior to discharge.    Final Clinical Impressions(s) / ED Diagnoses   Final diagnoses:  Hyperemesis gravidarum    ED Discharge Orders         Ordered    metoCLOPramide (REGLAN) 10 MG tablet  Every 6 hours PRN     06/24/18 McNeil, [place], DO 06/24/18 1559

## 2018-06-24 NOTE — Discharge Instructions (Signed)
Go to a drugstore and buy unisom and vitaminb6(pyridoxine) Take 1/2 a tab of unisom(12.5mg ) and 25mg  of vitamin b6.  Take this at night before bed.  If you continue to have nausea and vomiting take twice a day.  Follow up with OB.

## 2018-06-24 NOTE — ED Notes (Signed)
Unable to hear [facility]

## 2018-06-24 NOTE — ED Triage Notes (Signed)
Pt c/o abd pain that she feels is the result of vomiting x ttoday-pt taking flagyl x 2 days for STD-[redacted] weeks pregnant-states she was advised by Aggie Moats, OB to come to ED-NAD-steady gait

## 2018-06-24 NOTE — ED Notes (Signed)
Pt reports she started vomiting after starting flagyl this weekend.

## 2018-10-09 LAB — OB RESULTS CONSOLE RPR: RPR: NONREACTIVE

## 2018-11-17 ENCOUNTER — Other Ambulatory Visit (HOSPITAL_COMMUNITY): Payer: Self-pay | Admitting: Obstetrics and Gynecology

## 2018-11-17 DIAGNOSIS — O368120 Decreased fetal movements, second trimester, not applicable or unspecified: Secondary | ICD-10-CM

## 2018-11-24 ENCOUNTER — Ambulatory Visit (HOSPITAL_BASED_OUTPATIENT_CLINIC_OR_DEPARTMENT_OTHER)
Admission: RE | Admit: 2018-11-24 | Discharge: 2018-11-24 | Disposition: A | Discharge status: Home / Self Care | Payer: 59 | Source: Ambulatory Visit | Attending: Obstetrics and Gynecology | Admitting: Obstetrics and Gynecology

## 2018-11-24 ENCOUNTER — Other Ambulatory Visit: Payer: Self-pay

## 2018-11-24 DIAGNOSIS — O368130 Decreased fetal movements, third trimester, not applicable or unspecified: Secondary | ICD-10-CM

## 2018-11-24 DIAGNOSIS — O368120 Decreased fetal movements, second trimester, not applicable or unspecified: Secondary | ICD-10-CM | POA: Insufficient documentation

## 2018-11-24 DIAGNOSIS — Z3A33 33 weeks gestation of pregnancy: Secondary | ICD-10-CM | POA: Diagnosis not present

## 2018-11-24 DIAGNOSIS — O99213 Obesity complicating pregnancy, third trimester: Secondary | ICD-10-CM | POA: Diagnosis not present

## 2018-11-24 DIAGNOSIS — O10013 Pre-existing essential hypertension complicating pregnancy, third trimester: Secondary | ICD-10-CM | POA: Diagnosis not present

## 2018-11-24 DIAGNOSIS — O113 Pre-existing hypertension with pre-eclampsia, third trimester: Secondary | ICD-10-CM | POA: Diagnosis not present

## 2018-11-26 ENCOUNTER — Other Ambulatory Visit: Payer: Self-pay

## 2018-11-26 ENCOUNTER — Inpatient Hospital Stay (HOSPITAL_COMMUNITY): Payer: 59

## 2018-11-26 ENCOUNTER — Inpatient Hospital Stay (HOSPITAL_COMMUNITY)
Admission: AD | Admit: 2018-11-26 | Discharge: 2018-11-28 | DRG: 831 | Disposition: A | Discharge status: Other Acute Inpatient Hospital | Payer: 59 | Attending: Obstetrics and Gynecology | Admitting: Obstetrics and Gynecology

## 2018-11-26 ENCOUNTER — Encounter (HOSPITAL_COMMUNITY): Payer: Self-pay | Admitting: *Deleted

## 2018-11-26 DIAGNOSIS — O10919 Unspecified pre-existing hypertension complicating pregnancy, unspecified trimester: Secondary | ICD-10-CM | POA: Diagnosis present

## 2018-11-26 DIAGNOSIS — Z3A33 33 weeks gestation of pregnancy: Secondary | ICD-10-CM

## 2018-11-26 DIAGNOSIS — O10913 Unspecified pre-existing hypertension complicating pregnancy, third trimester: Secondary | ICD-10-CM | POA: Diagnosis not present

## 2018-11-26 DIAGNOSIS — O99343 Other mental disorders complicating pregnancy, third trimester: Secondary | ICD-10-CM | POA: Diagnosis present

## 2018-11-26 DIAGNOSIS — O113 Pre-existing hypertension with pre-eclampsia, third trimester: Principal | ICD-10-CM | POA: Diagnosis present

## 2018-11-26 DIAGNOSIS — F419 Anxiety disorder, unspecified: Secondary | ICD-10-CM | POA: Diagnosis present

## 2018-11-26 DIAGNOSIS — O289 Unspecified abnormal findings on antenatal screening of mother: Secondary | ICD-10-CM

## 2018-11-26 DIAGNOSIS — O98313 Other infections with a predominantly sexual mode of transmission complicating pregnancy, third trimester: Secondary | ICD-10-CM | POA: Diagnosis present

## 2018-11-26 DIAGNOSIS — O99213 Obesity complicating pregnancy, third trimester: Secondary | ICD-10-CM | POA: Diagnosis not present

## 2018-11-26 DIAGNOSIS — A599 Trichomoniasis, unspecified: Secondary | ICD-10-CM | POA: Diagnosis present

## 2018-11-26 DIAGNOSIS — O10013 Pre-existing essential hypertension complicating pregnancy, third trimester: Secondary | ICD-10-CM | POA: Diagnosis present

## 2018-11-26 HISTORY — DX: Anxiety disorder, unspecified: F41.9

## 2018-11-26 LAB — COMPREHENSIVE METABOLIC PANEL
ALT: 11 U/L (ref 0–44)
AST: 17 U/L (ref 15–41)
Albumin: 2.7 g/dL — ABNORMAL LOW (ref 3.5–5.0)
Alkaline Phosphatase: 135 U/L — ABNORMAL HIGH (ref 38–126)
Anion gap: 9 (ref 5–15)
BUN: 5 mg/dL — ABNORMAL LOW (ref 6–20)
CO2: 20 mmol/L — ABNORMAL LOW (ref 22–32)
Calcium: 9.3 mg/dL (ref 8.9–10.3)
Chloride: 106 mmol/L (ref 98–111)
Creatinine, Ser: 0.62 mg/dL (ref 0.44–1.00)
GFR calc Af Amer: >60 mL/min (ref >60)
GFR calc non Af Amer: >60 mL/min (ref >60)
Glucose, Bld: 86 mg/dL (ref 70–99)
Potassium: 3.4 mmol/L — ABNORMAL LOW (ref 3.5–5.1)
Sodium: 135 mmol/L (ref 135–145)
Total Bilirubin: 0.5 mg/dL (ref 0.3–1.2)
Total Protein: 6.6 g/dL (ref 6.5–8.1)

## 2018-11-26 LAB — CBC
HCT: 38.2 % (ref 36.0–46.0)
Hemoglobin: 13.2 g/dL (ref 12.0–15.0)
MCH: 30.8 pg (ref 26.0–34.0)
MCHC: 34.6 g/dL (ref 30.0–36.0)
MCV: 89.3 fL (ref 80.0–100.0)
Platelets: 326 10*3/uL (ref 150–400)
RBC: 4.28 MIL/uL (ref 3.87–5.11)
RDW: 12.2 % (ref 11.5–15.5)
WBC: 8.2 10*3/uL (ref 4.0–10.5)
nRBC: 0 % (ref 0.0–0.2)

## 2018-11-26 LAB — TYPE AND SCREEN
ABO/RH(D): AB POS
Antibody Screen: NEGATIVE

## 2018-11-26 LAB — PROTEIN / CREATININE RATIO, URINE
Creatinine, Urine: 37.26 mg/dL
Total Protein, Urine: <6 mg/dL

## 2018-11-26 MED ORDER — HYDROXYPROGESTERONE CAPROATE 250 MG/ML IM OIL
250.0000 mg | TOPICAL_OIL | INTRAMUSCULAR | Status: DC
  Administered 2018-11-26: 15:00:00 250 mg via INTRAMUSCULAR
  Filled 2018-11-26: qty 1

## 2018-11-26 MED ORDER — NIFEDIPINE 10 MG PO CAPS
10.0000 mg | ORAL_CAPSULE | Freq: Once | ORAL | Status: AC
  Administered 2018-11-26: 10 mg via ORAL

## 2018-11-26 MED ORDER — DOCUSATE SODIUM 100 MG PO CAPS
100.0000 mg | ORAL_CAPSULE | Freq: Every day | ORAL | Status: DC
  Filled 2018-11-26 (×2): qty 1

## 2018-11-26 MED ORDER — ONDANSETRON HCL 4 MG PO TABS
4.0000 mg | ORAL_TABLET | Freq: Three times a day (TID) | ORAL | Status: DC | PRN
  Administered 2018-11-26: 4 mg via ORAL
  Filled 2018-11-26: qty 1

## 2018-11-26 MED ORDER — METRONIDAZOLE 500 MG PO TABS
2000.0000 mg | ORAL_TABLET | Freq: Once | ORAL | Status: AC
  Administered 2018-11-26: 2000 mg via ORAL
  Filled 2018-11-26: qty 4

## 2018-11-26 MED ORDER — LABETALOL HCL 5 MG/ML IV SOLN
40.0000 mg | INTRAVENOUS | Status: DC | PRN
  Filled 2018-11-26: qty 8

## 2018-11-26 MED ORDER — NIFEDIPINE 10 MG PO CAPS
10.0000 mg | ORAL_CAPSULE | ORAL | Status: DC | PRN
  Filled 2018-11-26: qty 1

## 2018-11-26 MED ORDER — BETAMETHASONE SOD PHOS & ACET 6 (3-3) MG/ML IJ SUSP
12.0000 mg | INTRAMUSCULAR | Status: AC
  Administered 2018-11-26 – 2018-11-27 (×2): 12 mg via INTRAMUSCULAR
  Filled 2018-11-26 (×3): qty 2

## 2018-11-26 MED ORDER — CYCLOBENZAPRINE HCL 10 MG PO TABS
10.0000 mg | ORAL_TABLET | Freq: Once | ORAL | Status: AC
  Administered 2018-11-26: 10 mg via ORAL
  Filled 2018-11-26: qty 1

## 2018-11-26 MED ORDER — LABETALOL HCL 5 MG/ML IV SOLN
20.0000 mg | INTRAVENOUS | Status: DC | PRN
  Administered 2018-11-27: 20 mg via INTRAVENOUS
  Filled 2018-11-26: qty 4

## 2018-11-26 MED ORDER — ZOLPIDEM TARTRATE 5 MG PO TABS
5.0000 mg | ORAL_TABLET | Freq: Every evening | ORAL | Status: DC | PRN
  Administered 2018-11-27: 5 mg via ORAL
  Filled 2018-11-26: qty 1

## 2018-11-26 MED ORDER — HYDRALAZINE HCL 20 MG/ML IJ SOLN
5.0000 mg | INTRAMUSCULAR | Status: DC | PRN
  Administered 2018-11-26 – 2018-11-28 (×3): 5 mg via INTRAVENOUS
  Filled 2018-11-26 (×3): qty 1

## 2018-11-26 MED ORDER — LABETALOL HCL 200 MG PO TABS
200.0000 mg | ORAL_TABLET | Freq: Three times a day (TID) | ORAL | Status: DC
  Administered 2018-11-26 – 2018-11-28 (×6): 200 mg via ORAL
  Filled 2018-11-26 (×6): qty 1

## 2018-11-26 MED ORDER — HYDRALAZINE HCL 20 MG/ML IJ SOLN
10.0000 mg | INTRAMUSCULAR | Status: DC | PRN
  Administered 2018-11-27 – 2018-11-28 (×2): 10 mg via INTRAVENOUS
  Filled 2018-11-26: qty 1

## 2018-11-26 MED ORDER — NIFEDIPINE ER OSMOTIC RELEASE 30 MG PO TB24
30.0000 mg | ORAL_TABLET | Freq: Once | ORAL | Status: AC
  Administered 2018-11-26: 30 mg via ORAL
  Filled 2018-11-26: qty 1

## 2018-11-26 MED ORDER — ONDANSETRON HCL 4 MG/2ML IJ SOLN
4.0000 mg | Freq: Three times a day (TID) | INTRAMUSCULAR | Status: DC | PRN
  Administered 2018-11-28: 4 mg via INTRAVENOUS
  Filled 2018-11-26: qty 2

## 2018-11-26 MED ORDER — LABETALOL HCL 100 MG PO TABS
200.0000 mg | ORAL_TABLET | Freq: Once | ORAL | Status: DC
  Filled 2018-11-26: qty 2

## 2018-11-26 MED ORDER — ACETAMINOPHEN 325 MG PO TABS
650.0000 mg | ORAL_TABLET | ORAL | Status: DC | PRN
  Administered 2018-11-28 (×2): 650 mg via ORAL
  Filled 2018-11-26 (×2): qty 2

## 2018-11-26 MED ORDER — PRENATAL MULTIVITAMIN CH
1.0000 | ORAL_TABLET | Freq: Every day | ORAL | Status: DC
  Filled 2018-11-26 (×2): qty 1

## 2018-11-26 MED ORDER — CALCIUM CARBONATE ANTACID 500 MG PO CHEW: 2.0000 | CHEWABLE_TABLET | ORAL | Status: DC | PRN

## 2018-11-26 MED ORDER — HYDROXYZINE HCL 50 MG PO TABS
50.0000 mg | ORAL_TABLET | Freq: Four times a day (QID) | ORAL | Status: DC | PRN
  Administered 2018-11-26: 50 mg via ORAL
  Filled 2018-11-26 (×2): qty 1

## 2018-11-26 NOTE — MAU Note (Signed)
Urine in lab 

## 2018-11-26 NOTE — H&P (Addendum)
Karina [name] is a 33 y.o. female G5 P2022 @ 44 5/7 weeks h/o CHTN previously well controlled on Procardia XL 60 mg, h/o Incompetent cervix/PT delivery on 17 P admitted for observation due to HTN exacerbation.   Pt reports headache but it has eased off since she ate this afternoon.  Denies upper abdominal pain or visual changes.   Pt originally sent from office due to c/o contractions.  Pt reported contractions overnight that were strong.  However, pt had sex this morning.  Cervix was closed in the office.  Irregular contractions noted in office but resolved in MAU.  Pt reported she missed her dose of 17 P last week.  Feels contractions currently but they do not feel strong. Pt +Trich.  She was treated earlier in pregnancy but felt she did not keep medication down.  Partner was previously treated.  OB History    Gravida  6   Para  2   Term  1   Preterm  1   AB  3   Living  2     SAB  3   TAB  0   Ectopic  0   Multiple  0   Live Births  2          Past Medical History:  Diagnosis Date  . Anxiety   . Depression    Postpartum Depression with 1st pregnancy  . Fibroids   . History of chlamydia   . PID (pelvic inflammatory disease)   . Pregnancy induced hypertension   . Trichomonas    Past Surgical History:  Procedure Laterality Date  . CERVICAL CERCLAGE    . DILATION AND CURETTAGE OF UTERUS     Family History: family history includes Hypertension in her father and mother. Social History:  reports that she has never smoked. She has never used smokeless tobacco. She reports previous alcohol use. She reports that she does not use drugs.     Maternal Diabetes: No Genetic Screening: Abnormal:  Results: Elevated risk of Trisomy 21, Other:Negative Panorama Maternal Ultrasounds/Referrals: Normal Fetal Ultrasounds or other Referrals:  None Maternal Substance Abuse:  No Significant Maternal Medications:  Meds include: Other: Procardia XL Significant Maternal Lab  Results:  Lab values include: Other: +Trich Other Comments:  H/o CHTN and anxiety  ROS Maternal Medical History:  Contractions: Onset was yesterday.    Fetal activity: Perceived fetal activity is normal.    Prenatal complications: PIH.   Prenatal Complications - Diabetes: none.    Dilation: Closed Effacement (%): Thick Exam by:: V Rogers CNM  Blood pressure (!) 153/106, pulse 73, temperature 98.7 F (37.1 C), temperature source Oral, resp. rate 18, height 5\' 5"  (1.651 m), weight 108.6 kg, last menstrual period 04/04/2018, SpO2 100 %. Maternal Exam:  Uterine Assessment: Contraction strength is mild.  Contraction frequency is irregular.   Abdomen: Patient reports no abdominal tenderness. Estimated fetal weight is 11/13/18- 3 lbs 4 oz +/- 3 oz, EFW 21%.   Fetal presentation: vertex  Introitus: Normal vulva. Vagina is positive for vaginal discharge.  Blood tinged discharge, no odor  Pelvis: adequate for delivery.   Cervix: Closed/Thick/High @ ~9:45 am  Fetal Exam Fetal Monitor Review: Mode: fetoscope.   Baseline rate: 140s.  Variability: minimal (<5 bpm).   Pattern: accelerations present and variable decelerations.   BPP 8/8 today.  AFI 12.91 cm, Vertex. Variable deceleration at 1528.  Good variability before and after.  Fetal State Assessment: Category I - tracings are normal.  Physical Exam  Constitutional: She is oriented to person, place, and time. She appears well-developed and well-nourished.  HENT:  Head: Normocephalic and atraumatic.  Eyes: EOM are normal.  Neck: Normal range of motion.  Respiratory: Effort normal. No respiratory distress.  GI: There is no abdominal tenderness.  Genitourinary:    Vulva normal.     Vaginal discharge present.   Musculoskeletal: Normal range of motion.        General: No tenderness or edema.     Comments: SCDs in place.  Neurological: She is alert and oriented to person, place, and time.  Reflexes not illicited.  Skin:  Skin is warm and dry.  Psychiatric: She has a normal mood and affect.    Prenatal labs: ABO, Rh: --/--/AB POS, AB POS Performed at [facility], [place] [address], [place], [place] [address]  [phone]) Antibody: NEG (04/01 1350)  Assessment/Plan: IUP @ 67 5/7 weeks CHTN, with HTN exacerbation.  Undetectable urine protein.  Labs are normal.  Pt with elevated BP above 150/100 with a total of Procardia XL 90 mg.  Add on Labetalol 200 mg Q8 hours.  Repeat labs in am.  IV Hydralazine PRN.  Check urine drug screen.  Discharge home if BP controlled. Preterm contractions. H/o IC/PT delivery.  May be due to Trich or low progesterone levels due to missed 17 P.  17 P given on admission.  S/p BMZ #1.    Monitor for contractions as needed. Trichimoniasis  Flagyl 2 grams now.  Zofran prior to medication Anxiety  Pt feels a little anxious but not as bad as usual.  Atarax 50 mg prn anxiety. Category I tracing-BPP 8/8 today.  NST q shift  [facility] covering at 7 pm tonight. Dr. Landry Mellow covering 7 am 11/27/2018.      Thurnell Lose 11/26/2018, 5:26 PM

## 2018-11-26 NOTE — MAU Note (Signed)
Pt states she took her BP med since arriving in MAU.

## 2018-11-26 NOTE — MAU Note (Addendum)
Sent from office, pt contracting.  Hx of incompetent cervix.  cx per MD was closed. (CHTN-on meds, has not taken today))

## 2018-11-26 NOTE — MAU Provider Note (Addendum)
History     CSN: [id number]  Arrival date and time: 11/26/18 8182   First Provider Initiated Contact with Patient 11/26/18 1049      Chief Complaint  Patient presents with  . Contractions   Karina [name] is a 33 y.o. G6P2 at [redacted]w[redacted]d who was sent over from the office for NRNST and contractions. She has a hx of incompetent cervix is on 17P. Was closed in the office this morning. Reports she had IC this morning prior to appointment and started having regular painful contractions. She reports 2 contractions over 30 minutes since being in MAU and more back pain now than contractions, rates back pain 7/10- has not taken any medication for pain. Pregnancy is complicated by [facility] on Procardia 60mg  daily. She took medication around 10am when she arrived to MAU. She reports +FM, denies vaginal discharge or vaginal bleeding. Denies LOF.  She reports intermittent HA, denies vision changes and RUQ abdominal pain.    OB History    Gravida  6   Para  2   Term  1   Preterm  1   AB  3   Living  2     SAB  3   TAB  0   Ectopic  0   Multiple  0   Live Births  2           Past Medical History:  Diagnosis Date  . Anxiety   . Depression    Postpartum Depression with 1st pregnancy  . Fibroids   . History of chlamydia   . PID (pelvic inflammatory disease)   . Pregnancy induced hypertension   . Trichomonas     Past Surgical History:  Procedure Laterality Date  . CERVICAL CERCLAGE    . DILATION AND CURETTAGE OF UTERUS      Family History  Problem Relation Age of Onset  . Hypertension Mother   . Hypertension Father     Social History   Tobacco Use  . Smoking status: Never Smoker  . Smokeless tobacco: Never Used  Substance Use Topics  . Alcohol use: Not Currently  . Drug use: No    Allergies: No Known Allergies  Medications Prior to Admission  Medication Sig Dispense Refill Last Dose  . acetaminophen (TYLENOL) 500 MG tablet Take 1,000 mg by mouth every 6 (six)  hours as needed for mild pain or headache.   Past Month at Unknown time  . NIFEdipine (PROCARDIA XL/NIFEDICAL XL) 60 MG 24 hr tablet Take 60 mg by mouth daily.   11/26/2018 at Unknown time  . labetalol (NORMODYNE) 100 MG tablet Take 100 mg by mouth 3 (three) times daily.   10/15/2017 at 0700  . metoCLOPramide (REGLAN) 10 MG tablet Take 1 tablet (10 mg total) by mouth every 6 (six) hours as needed for nausea (nausea/headache). 6 tablet 0   . metoprolol tartrate (LOPRESSOR) 100 MG tablet Take 100 mg by mouth 2 (two) times daily.   Past Month at Unknown time  . norethindrone-ethinyl estradiol-iron (BLISOVI FE 1.5/30) 1.5-30 MG-MCG tablet Take 1 tablet by mouth daily as needed (bleeding).   10/15/2017 at Unknown time    Review of Systems  Constitutional:       Positive for Hypertension  Respiratory: Negative.   Cardiovascular: Negative.   Gastrointestinal: Positive for abdominal pain. Negative for constipation, diarrhea, nausea and vomiting.  Genitourinary: Negative.   Musculoskeletal: Positive for back pain.   Physical Exam   Patient Vitals for the past 24 hrs:  BP Temp Temp src Pulse Resp SpO2 Height Weight  11/26/18 1331 (!) 175/117 - - 81 - - - -  11/26/18 1316 (!) 158/112 - - 82 - - - -  11/26/18 1301 (!) 160/105 - - 88 - - - -  11/26/18 1245 (!) 162/108 - - 84 - - - -  11/26/18 1230 (!) 157/107 - - 75 - - - -  11/26/18 1215 (!) 163/109 - - 85 - - - -  11/26/18 1159 (!) 151/109 - - 73 - - - -  11/26/18 1116 (!) 150/113 - - 69 - - - -  11/26/18 1101 (!) 154/109 - - 68 - - - -  11/26/18 1038 (!) 146/104 - - 70 - - - -  11/26/18 1018 (!) 147/101 98.7 F (37.1 C) Oral 76 18 100 % 5\' 5"  (1.651 m) 108.6 kg   Physical Exam  Nursing note and vitals reviewed. Constitutional: She is oriented to person, place, and time. She appears well-developed and well-nourished. No distress.  Cardiovascular: Normal rate, regular rhythm and normal heart sounds.  Respiratory: Effort normal and breath sounds  normal. No respiratory distress. She has no wheezes. She has no rales.  GI: Soft. There is no abdominal tenderness. There is no rebound and no guarding.  Gravid appropriate for gestational age, mild contractions palpated   Musculoskeletal: Normal range of motion.        General: No tenderness.  Neurological: She is alert and oriented to person, place, and time.  Psychiatric: She has a normal mood and affect. Her behavior is normal. Thought content normal.   Dilation: Closed Effacement (%): Thick Cervical Position: Posterior Exam by:: V Leda Bellefeuille CNM   FHR: 145/ moderate/ +accels / no decelerations  Toco: occasional mild contractions with UI  MAU Course  Procedures  MDM Procardia 10mg  and Flexeril 10mg  given to patient for contractions and back pain  NRNST in office and in MAU, BPP ordered   BPP 8/8  Reviewed BPs in office usually running 947-654 systolic and 65-035 diastolic  PEC labs ordered and reviewed- negative labs   Severe range BP noted in MAU, consulted with Dr Roselie Awkward who recommends 200mg  Labetalol PO and admission to [facility] with Dr Simona Huh on recommendations who agrees with admission to antenatal unit but instead of Labetalol give 30mg  Procardia to be consistent with current medication.   Admit to antenatal, orders placed by Dr Simona Huh.  Admission cleared by NICU, Dr Tora Kindred    Assessment and Plan  1. Chronic Hypertension with SIPE  2. [redacted] weeks gestation   Admit to Antenatal  Orders placed by Dr Simona Huh  Care taken over by [facility]   Lajean Manes CNM 11/26/2018, 1:41 PM

## 2018-11-27 DIAGNOSIS — O10919 Unspecified pre-existing hypertension complicating pregnancy, unspecified trimester: Secondary | ICD-10-CM | POA: Diagnosis present

## 2018-11-27 LAB — COMPREHENSIVE METABOLIC PANEL
ALT: 10 U/L (ref 0–44)
AST: 16 U/L (ref 15–41)
Albumin: 2.6 g/dL — ABNORMAL LOW (ref 3.5–5.0)
Alkaline Phosphatase: 137 U/L — ABNORMAL HIGH (ref 38–126)
Anion gap: 7 (ref 5–15)
BUN: 11 mg/dL (ref 6–20)
CO2: 20 mmol/L — ABNORMAL LOW (ref 22–32)
Calcium: 9.5 mg/dL (ref 8.9–10.3)
Chloride: 107 mmol/L (ref 98–111)
Creatinine, Ser: 0.74 mg/dL (ref 0.44–1.00)
GFR calc Af Amer: >60 mL/min (ref >60)
GFR calc non Af Amer: >60 mL/min (ref >60)
Glucose, Bld: 119 mg/dL — ABNORMAL HIGH (ref 70–99)
Potassium: 3.9 mmol/L (ref 3.5–5.1)
Sodium: 134 mmol/L — ABNORMAL LOW (ref 135–145)
Total Bilirubin: 0.3 mg/dL (ref 0.3–1.2)
Total Protein: 6.2 g/dL — ABNORMAL LOW (ref 6.5–8.1)

## 2018-11-27 LAB — CBC
HCT: 38.4 % (ref 36.0–46.0)
Hemoglobin: 13.5 g/dL (ref 12.0–15.0)
MCH: 31.5 pg (ref 26.0–34.0)
MCHC: 35.2 g/dL (ref 30.0–36.0)
MCV: 89.5 fL (ref 80.0–100.0)
Platelets: 333 10*3/uL (ref 150–400)
RBC: 4.29 MIL/uL (ref 3.87–5.11)
RDW: 12.3 % (ref 11.5–15.5)
WBC: 13.7 10*3/uL — ABNORMAL HIGH (ref 4.0–10.5)
nRBC: 0 % (ref 0.0–0.2)

## 2018-11-27 LAB — RAPID URINE DRUG SCREEN, HOSP PERFORMED
Amphetamines: NOT DETECTED
Barbiturates: NOT DETECTED
Benzodiazepines: NOT DETECTED
Cocaine: NOT DETECTED
Opiates: NOT DETECTED
Tetrahydrocannabinol: POSITIVE — AB

## 2018-11-27 LAB — ABO/RH: ABO/RH(D): AB POS

## 2018-11-27 LAB — PROTEIN / CREATININE RATIO, URINE
Creatinine, Urine: 189.31 mg/dL
Protein Creatinine Ratio: 0.08 mg/mg{Cre} (ref 0.00–0.15)
Total Protein, Urine: 16 mg/dL

## 2018-11-27 MED ORDER — NIFEDIPINE ER OSMOTIC RELEASE 30 MG PO TB24
90.0000 mg | ORAL_TABLET | Freq: Every day | ORAL | Status: AC
  Administered 2018-11-27: 90 mg via ORAL
  Filled 2018-11-27: qty 3

## 2018-11-27 MED ORDER — NIFEDIPINE ER OSMOTIC RELEASE 30 MG PO TB24: 90.0000 mg | ORAL_TABLET | Freq: Every day | ORAL | Status: DC

## 2018-11-27 MED ORDER — LACTATED RINGERS IV BOLUS
500.0000 mL | Freq: Once | INTRAVENOUS | Status: AC
  Administered 2018-11-27: 06:00:00 500 mL via INTRAVENOUS

## 2018-11-27 MED ORDER — NIFEDIPINE ER OSMOTIC RELEASE 30 MG PO TB24
90.0000 mg | ORAL_TABLET | Freq: Every day | ORAL | Status: DC
  Administered 2018-11-28: 90 mg via ORAL
  Filled 2018-11-27 (×2): qty 3

## 2018-11-27 NOTE — Progress Notes (Signed)
OWEN PAGNOTTA is a 33 y.o. female at 47 wks and 6 days admitted with uncontrolled chronic hypertension to rule out preeclampsia.   Subjective: pt is without complaints. +FM no lof regular contractions this morning that resolved after IV fluids. Reports occasional contraction but no more than 4 in an hour. She denies headache visual disturbances or ruq pain.   Vitals:   11/27/18 0811 11/27/18 1154 11/27/18 1200 11/27/18 1620  BP: 132/79 (!) 150/93 (!) 150/93 (!) 152/94  Pulse: 87 83 83 84  Resp:   18 18  Temp:   98.2 F (36.8 C) 98.1 F (36.7 C)  TempSrc:   Oral Oral  SpO2:   100% 100%  Weight:      Height:       General Alert and Oriented NAD.  Abdomen gravid nontender Extremities 1+ edema normal reflexes.  FHR NST this AM reactive.   Results for orders placed or performed during the hospital encounter of 11/26/18 (from the past 24 hour(s))  Protein / creatinine ratio, urine     Status: None   Collection Time: 11/27/18  4:25 AM  Result Value Ref Range   Creatinine, Urine 189.31 mg/dL   Total Protein, Urine 16 mg/dL   Protein Creatinine Ratio 0.08 0.00 - 0.15 mg/mg[Cre]  Comprehensive metabolic panel     Status: Abnormal   Collection Time: 11/27/18  6:30 AM  Result Value Ref Range   Sodium 134 (L) 135 - 145 mmol/L   Potassium 3.9 3.5 - 5.1 mmol/L   Chloride 107 98 - 111 mmol/L   CO2 20 (L) 22 - 32 mmol/L   Glucose, Bld 119 (H) 70 - 99 mg/dL   BUN 11 6 - 20 mg/dL   Creatinine, Ser 0.74 0.44 - 1.00 mg/dL   Calcium 9.5 8.9 - 10.3 mg/dL   Total Protein 6.2 (L) 6.5 - 8.1 g/dL   Albumin 2.6 (L) 3.5 - 5.0 g/dL   AST 16 15 - 41 U/L   ALT 10 0 - 44 U/L   Alkaline Phosphatase 137 (H) 38 - 126 U/L   Total Bilirubin 0.3 0.3 - 1.2 mg/dL   GFR calc non Af Amer >60 >60 mL/min   GFR calc Af Amer >60 >60 mL/min   Anion gap 7 5 - 15  CBC     Status: Abnormal   Collection Time: 11/27/18  6:30 AM  Result Value Ref Range   WBC 13.7 (H) 4.0 - 10.5 K/uL   RBC 4.29 3.87 - 5.11 MIL/uL    Hemoglobin 13.5 12.0 - 15.0 g/dL   HCT 38.4 36.0 - 46.0 %   MCV 89.5 80.0 - 100.0 fL   MCH 31.5 26.0 - 34.0 pg   MCHC 35.2 30.0 - 36.0 g/dL   RDW 12.3 11.5 - 15.5 %   Platelets 333 150 - 400 K/uL   nRBC 0.0 0.0 - 0.2 %  .Marland Kitchen  A/P 33 wks and 6 days with chronic hypertension.. no evidence of preeclampsia based on labs.  - continue procardia XL 90 mg daily and Labetalol 200 mg BID If bp stable overnight d/c home in AM  Preterm contractions resolved- encourage po hydration.  NST q shift.  Dr. Simona Huh to assume care in AM

## 2018-11-28 DIAGNOSIS — Z3A33 33 weeks gestation of pregnancy: Secondary | ICD-10-CM

## 2018-11-28 DIAGNOSIS — O10913 Unspecified pre-existing hypertension complicating pregnancy, third trimester: Secondary | ICD-10-CM

## 2018-11-28 LAB — COMPREHENSIVE METABOLIC PANEL
ALT: 10 U/L (ref 0–44)
AST: 15 U/L (ref 15–41)
Albumin: 2.7 g/dL — ABNORMAL LOW (ref 3.5–5.0)
Alkaline Phosphatase: 134 U/L — ABNORMAL HIGH (ref 38–126)
Anion gap: 7 (ref 5–15)
BUN: 10 mg/dL (ref 6–20)
CO2: 21 mmol/L — ABNORMAL LOW (ref 22–32)
Calcium: 8.3 mg/dL — ABNORMAL LOW (ref 8.9–10.3)
Chloride: 105 mmol/L (ref 98–111)
Creatinine, Ser: 0.64 mg/dL (ref 0.44–1.00)
GFR calc Af Amer: >60 mL/min (ref >60)
GFR calc non Af Amer: >60 mL/min (ref >60)
Glucose, Bld: 93 mg/dL (ref 70–99)
Potassium: 3.6 mmol/L (ref 3.5–5.1)
Sodium: 133 mmol/L — ABNORMAL LOW (ref 135–145)
Total Bilirubin: 0.3 mg/dL (ref 0.3–1.2)
Total Protein: 6.2 g/dL — ABNORMAL LOW (ref 6.5–8.1)

## 2018-11-28 LAB — CBC
HCT: 39.5 % (ref 36.0–46.0)
Hemoglobin: 13.5 g/dL (ref 12.0–15.0)
MCH: 30.7 pg (ref 26.0–34.0)
MCHC: 34.2 g/dL (ref 30.0–36.0)
MCV: 89.8 fL (ref 80.0–100.0)
Platelets: 369 10*3/uL (ref 150–400)
RBC: 4.4 MIL/uL (ref 3.87–5.11)
RDW: 12.6 % (ref 11.5–15.5)
WBC: 16.9 10*3/uL — ABNORMAL HIGH (ref 4.0–10.5)
nRBC: 0 % (ref 0.0–0.2)

## 2018-11-28 LAB — URIC ACID: Uric Acid, Serum: 4.6 mg/dL (ref 2.5–7.1)

## 2018-11-28 LAB — LACTATE DEHYDROGENASE: LDH: 136 U/L (ref 98–192)

## 2018-11-28 MED ORDER — SERTRALINE HCL 50 MG PO TABS: 50.0000 mg | ORAL_TABLET | Freq: Every day | ORAL | Status: DC

## 2018-11-28 MED ORDER — MAGNESIUM SULFATE 40 G IN LACTATED RINGERS - SIMPLE
2.0000 g/h | INTRAVENOUS | Status: DC
  Administered 2018-11-28: 13:00:00 2 g/h via INTRAVENOUS
  Filled 2018-11-28: qty 500

## 2018-11-28 MED ORDER — LACTATED RINGERS IV SOLN
INTRAVENOUS | Status: DC
  Administered 2018-11-28: 13:00:00 via INTRAVENOUS

## 2018-11-28 MED ORDER — MAGNESIUM SULFATE BOLUS VIA INFUSION
4.0000 g | Freq: Once | INTRAVENOUS | Status: AC
  Administered 2018-11-28: 13:00:00 4 g via INTRAVENOUS
  Filled 2018-11-28: qty 500

## 2018-11-28 MED ORDER — SERTRALINE HCL 50 MG PO TABS
25.0000 mg | ORAL_TABLET | Freq: Every day | ORAL | Status: DC
  Administered 2018-11-28: 25 mg via ORAL
  Filled 2018-11-28: qty 1

## 2018-11-28 MED ORDER — LABETALOL HCL 200 MG PO TABS
400.0000 mg | ORAL_TABLET | Freq: Three times a day (TID) | ORAL | Status: DC
  Administered 2018-11-28: 400 mg via ORAL
  Filled 2018-11-28: qty 2

## 2018-11-28 NOTE — Plan of Care (Signed)
Pt for transfer to [facility] due to need for delivery, but no NICU beds here.  She will complete her progression there.

## 2018-11-28 NOTE — Discharge Summary (Signed)
    OB Discharge Summary     Patient Name: Karina [name] DOB: 04/01/1986 MRN: [id number]  Date of admission: 11/26/2018 Delivering MD: This patient has no babies on file.  Date of discharge: 11/28/2018  Admitting diagnosis: PRETERM LABOR NON-REACTIVE Intrauterine pregnancy: [redacted]w[redacted]d     Secondary diagnosis:  Active Problems:   HTN in pregnancy, chronic   Chronic hypertension affecting pregnancy CHTN with Superimposed Preeclampsia with severe Features  Additional problems: H/o Anxiety     Diagnosis at time of transfer: CHTN with superimposed preeclampsia                                                                                               Hospital course:  Pt originally sent from office due to c/o contractions.  Contractions eventually resolved but pt noted to have severe range BPs.  BP medication continually increased but pt still required IV antihypertensives.  Pt developed a headache that would not resolve with Tylenol on day of transfer.  Consulted with MFM.  Recommended that pt proceed with delivery.  Pt received BMZ on admission. NICU was full so pt needed to be transferred for perinatal care.  Physical exam  Vitals:   11/28/18 1700 11/28/18 1703 11/28/18 1801 11/28/18 1903  BP:  126/75 131/75 116/74  Pulse:  89 87 79  Resp: 18  20   Temp:      TempSrc:      SpO2:      Weight:      Height:       See note on day of transfer.  Labs: Lab Results  Component Value Date   WBC 16.9 (H) 11/28/2018   HGB 13.5 11/28/2018   HCT 39.5 11/28/2018   MCV 89.8 11/28/2018   PLT 369 11/28/2018   CMP Latest Ref Rng & Units 11/28/2018  Glucose 70 - 99 mg/dL 93  BUN 6 - 20 mg/dL 10  Creatinine 0.44 - 1.00 mg/dL 0.64  Sodium 135 - 145 mmol/L 133(L)  Potassium 3.5 - 5.1 mmol/L 3.6  Chloride 98 - 111 mmol/L 105  CO2 22 - 32 mmol/L 21(L)  Calcium 8.9 - 10.3 mg/dL 8.3(L)  Total Protein 6.5 - 8.1 g/dL 6.2(L)  Total Bilirubin 0.3 - 1.2 mg/dL 0.3  Alkaline Phos 38 - 126 U/L 134(H)   AST 15 - 41 U/L 15  ALT 0 - 44 U/L 10    Discharge instruction: per After Visit Summary and "Baby and Me Booklet".  After visit meds:  Allergies as of 11/28/2018   No Known Allergies     Medication List    STOP taking these medications   hydrOXYzine 25 MG tablet Commonly known as:  ATARAX/VISTARIL       Disposition:  Pt transferred to [facility].  11/28/2018 Thurnell Lose, MD

## 2018-11-28 NOTE — Progress Notes (Signed)
   11/28/18 1605  Section 1: Provider Certification  Patient Condition Patient stabilized;Benefit outweighs risk  Reason for Transfer Need NICU  Benefits of Transfer Available NICU  Risks of Transfer None  Level of Care Other (Comment) (L&D)  Accepting Physician Dr. Huey Romans  Sending Physician Dr. Thurnell Lose  Physician Assessment Patient examined and risks and benefits explained  Section 2: Clinician Certification  Accepting hospital or facility Available service confirmed;Available space confirmed  Accepting [facility]  Transfer Coordinator - name, # Santiago Glad L&D Charge RN  Transport By Karen Chafe  Section 4: Provider Reassessment (within 1 hour of departure)  Physician Reassessment Reassessment completed prior to transfer (Provider Only)

## 2018-11-28 NOTE — Progress Notes (Signed)
Report called to Gerald Stabs, RN on Labor & Delivery at [facility] [phone].  Requested transport via PAL line at [facility] [phone], Dispatcher [phone].  She is in queue and we are awaiting call from transport team.

## 2018-11-28 NOTE — Progress Notes (Addendum)
In to assess pt. Pt required IV hydralazine this am again.  Pt continued to have a headache after administration of Tylenol.  Pt discussed with Dr. Leslie Dales, MFM.  He recommended moving towards delivery due to preeclampsia with severe features. Magnesium Sulfate started.   NICU is currently full.  Consulted with Dr. Brett Fairy, neonatologist.  States they can not accept baby.  Pt currently reports headache went away and has now returned. Denies visual changes. Pt also has nausea.  Denies upper abdominal pain other than contractions, which feels stronger.  Denies LOF currently.  BP (!) 157/108 (BP Location: Right Arm)   Pulse 93   Temp 98.1 F (36.7 C) (Oral)   Resp 16   Ht 5\' 5"  (1.651 m)   Wt 107.8 kg   LMP 04/04/2018   SpO2 100%   BMI 39.56 kg/m   Gen:  Pt covering eyes, room is dark Abd:  No upper abdominal pain.  Contractions palpate mild to moderate. Cervix:  Closed, thick firm. Neuro:  DTR not illicited.  Reactive NST, contractions q 6-7 min  Awaiting formal consult by Dr. Gertie Exon.  Pt will need to be transferred if delivery indicated.Check labs prior to transfer.  Plan d/w pt.

## 2018-11-28 NOTE — Consult Note (Addendum)
Virtual CONSULT  Requesting Provider: Thurnell Lose, MD Reason for request: Superimposed preeclampsia vs hypertensive exacerbation. Date of Service 11/28/18.  I met with Karina [name] a G6P2 at 4 w 0 d dated by LMP confirmed with a 7 week 6 day ultrasound. I am seeing [name] at the request of Dr. Simona Huh for consultation regarding the above noted indications.  She reports and unremitting headache that began on Wednesday, it cycles but does not respond to tylenol. She notes that she feels poorly as a whole. She denies vaginal bleeding, loss of fluid, right upper quadrant pain or decreased fetal movement.  Karina [name] has a history of chronic hypertension but over the last several days has needed additional antihypertensive support. In addition, she has required two doses of  IV antihypertensive therapy.  Her vital have ranged from 11/26/18 to 11/28/18 her blood pressures ranged from 145/80's to 168/108 required two doses of hydralazine IV to reduce her blood pressure. Her headache worsened after that time. She has completed a course of betamethasone is now on magnesium. Her laboratory values are normal. Fetal heart rate has been reactive. UDS negative.  Per Dr. Simona Huh Karina [name]'s room is dark and her eyes are covered due to her headache. She is also not prone to migraines.  BP (!) 153/97 (BP Location: Right Arm)   Pulse 88   Temp 98.5 F (36.9 C) (Oral)   Resp 17   Ht _0  (1.651 m)   Wt 107.8 kg   LMP 04/04/2018   SpO2 99%   BMI 39.56 kg/m   Past Medical History:  Diagnosis Date  . Anxiety   . Depression    Postpartum Depression with 1st pregnancy  . Fibroids   . History of chlamydia   . PID (pelvic inflammatory disease)   . Pregnancy induced hypertension   . Trichomonas    Past Surgical History:  Procedure Laterality Date  . CERVICAL CERCLAGE    . DILATION AND CURETTAGE OF UTERUS     Social History   Socioeconomic History  . Marital status: Single    Spouse name:  Not on file  . Number of children: Not on file  . Years of education: Not on file  . Highest education level: Not on file  Occupational History  . Not on file  Social Needs  . Financial resource strain: Not very hard  . Food insecurity:    Worry: Patient refused    Inability: Patient refused  . Transportation needs:    Medical: Patient refused    Non-medical: Patient refused  Tobacco Use  . Smoking status: Never Smoker  . Smokeless tobacco: Never Used  Substance and Sexual Activity  . Alcohol use: Not Currently  . Drug use: No  . Sexual activity: Yes  Lifestyle  . Physical activity:    Days per week: Not on file    Minutes per session: Not on file  . Stress: Not on file  Relationships  . Social connections:    Talks on phone: Not on file    Gets together: Not on file    Attends religious service: Not on file    Active member of club or organization: Not on file    Attends meetings of clubs or organizations: Not on file    Relationship status: Not on file  . Intimate partner violence:    Fear of current or ex partner: Not on file    Emotionally abused: Not on file    Physically abused: Not on  file    Forced sexual activity: Not on file  Other Topics Concern  . Not on file  Social History Narrative  . Not on file   Family History  Problem Relation Age of Onset  . Hypertension Mother   . Hypertension Father    No current facility-administered medications on file prior to encounter.    Current Outpatient Medications on File Prior to Encounter  Medication Sig Dispense Refill  . hydrOXYzine (ATARAX/VISTARIL) 25 MG tablet Take 25 tablets by mouth at bedtime as needed (sleep).      Impression/Counseling:  I met with [name] telephonically I reviewed her history and management to date.  I discussed the diagnosis of preeclampsia with severe features. I reviewed the role of magnesium sulfate, betamethasone and the risk/benefits of expectant management with her  persistent headache and elevated blood pressure at the 34 weeks. We discussed particularly the risk of eclampsia, stroke, placental abruption and stillbirth. We reviewed the recommendation of delivery at this time.  She is in agreement and understands that due to limited NICU capacity a transfer for delivery is necessary. I also reviewed this this with Dr. Simona Huh and she is agreement with this plan.  I spent 30 minutes with >50% minute is direct communication and care coordination with [name].  Vikki Ports, MD  [facility]

## 2018-11-28 NOTE — Progress Notes (Signed)
Confirmed baby is vertex by ultrasound.

## 2018-11-28 NOTE — Progress Notes (Signed)
Sitting up vomiting

## 2018-11-28 NOTE — Progress Notes (Signed)
Karina [name] is a 33 y.o. female at 87 wks and 0 days admitted with uncontrolled chronic hypertension to rule out preeclampsia.   Subjective: Pt reports she had elevated BP overnight and required IV antihypertensives.  States she has had a headache but has refused pain medication.  Denies visual disturbances but she can tell when her heart rate is b/c her face gets hot. She denies headache visual disturbances or ruq pain.   Contractions stronger overnight.  Vitals:   11/28/18 0006 11/28/18 0320 11/28/18 0334 11/28/18 0600  BP: (!) 150/93 (!) 163/101 (!) 151/86   Pulse: 90 87 89   Resp:  18    Temp:  97.7 F (36.5 C)    TempSrc:  Oral    SpO2:  100%    Weight:    107.8 kg  Height:       General Alert and Oriented NAD.  Abdomen gravid nontender Extremities +edema, no calf tenderness Neuro:  DTR not illicited.   FHR NST reactive x 2 yesterday.  Irregular contractions.   No results found for this or any previous visit (from the past 24 hour(s))...  A/P  IUP @ 58 0/7 weeks CHTN, with HTN exacerbation.  Procardia XL 90 mg and Labetalol 200 mg q 8 hours and still required IV Hydralazine.  Increase Labetalol to 400 mg q 8 hours.  Start 24 hour urine protein.  Urine tox negative for cocaine.  Consult MFM for recommendations due to sudden uncontrolled HTN. Preterm contractions. H/o IC/PT delivery.  May be due to Trich or low progesterone levels due to missed 17 P.             17 P given on admission. Continue once weekly.             S/p full course of BMZ.               Monitor for contractions as needed. Trichimoniasis             S/p Flagyl. Anxiety  Pt unable to sleep overnight.  Worried about her children.  Start Zoloft. Pt agreeable.               Category I tracing-BPP 8/8 on 4/120.  REactive fetal tracing.             NST q shift

## 2018-11-28 NOTE — Progress Notes (Signed)
Discussed pt with Dr. Huey Romans and Santiago Glad (L&D charge nurse) at [facility].  Transfer accepted.  Copy of all records requested with transfer. Communicated with Dr. Gertie Exon.  He will speak with pt prior to departure.

## 2018-11-28 NOTE — Progress Notes (Signed)
Pt transferred to [facility] with EMS in stable condition. 24 hour urine specimen sent with EMS. [place] [employer] consultant notified of transfer time.  Nicholes Calamity, RN

## 2019-03-07 ENCOUNTER — Emergency Department (HOSPITAL_COMMUNITY): Payer: No Typology Code available for payment source

## 2019-03-07 ENCOUNTER — Other Ambulatory Visit: Payer: Self-pay

## 2019-03-07 ENCOUNTER — Encounter (HOSPITAL_COMMUNITY): Payer: Self-pay

## 2019-03-07 ENCOUNTER — Emergency Department (HOSPITAL_COMMUNITY)
Admission: EM | Admit: 2019-03-07 | Discharge: 2019-03-07 | Disposition: A | Discharge status: Home / Self Care | Payer: No Typology Code available for payment source | Attending: Emergency Medicine | Admitting: Emergency Medicine

## 2019-03-07 DIAGNOSIS — G932 Benign intracranial hypertension: Secondary | ICD-10-CM | POA: Diagnosis not present

## 2019-03-07 DIAGNOSIS — R51 Headache: Secondary | ICD-10-CM | POA: Diagnosis present

## 2019-03-07 DIAGNOSIS — I10 Essential (primary) hypertension: Secondary | ICD-10-CM | POA: Insufficient documentation

## 2019-03-07 LAB — CSF CELL COUNT WITH DIFFERENTIAL
RBC Count, CSF: 1 /mm3 — ABNORMAL HIGH
RBC Count, CSF: 1 /mm3 — ABNORMAL HIGH
Tube #: 1
Tube #: 4
WBC, CSF: 2 /mm3 (ref 0–5)
WBC, CSF: 2 /mm3 (ref 0–5)

## 2019-03-07 LAB — COMPREHENSIVE METABOLIC PANEL
ALT: 8 U/L (ref 0–44)
AST: 30 U/L (ref 15–41)
Albumin: 3.9 g/dL (ref 3.5–5.0)
Alkaline Phosphatase: 86 U/L (ref 38–126)
Anion gap: 8 (ref 5–15)
BUN: 14 mg/dL (ref 6–20)
CO2: 24 mmol/L (ref 22–32)
Calcium: 10 mg/dL (ref 8.9–10.3)
Chloride: 107 mmol/L (ref 98–111)
Creatinine, Ser: 1.01 mg/dL — ABNORMAL HIGH (ref 0.44–1.00)
GFR calc Af Amer: >60 mL/min (ref >60)
GFR calc non Af Amer: >60 mL/min (ref >60)
Glucose, Bld: 85 mg/dL (ref 70–99)
Potassium: 4.8 mmol/L (ref 3.5–5.1)
Sodium: 139 mmol/L (ref 135–145)
Total Bilirubin: 1.1 mg/dL (ref 0.3–1.2)
Total Protein: 7.2 g/dL (ref 6.5–8.1)

## 2019-03-07 LAB — URINALYSIS, ROUTINE W REFLEX MICROSCOPIC
Bacteria, UA: NONE SEEN
Bilirubin Urine: NEGATIVE
Glucose, UA: NEGATIVE mg/dL
Ketones, ur: NEGATIVE mg/dL
Leukocytes,Ua: NEGATIVE
Nitrite: NEGATIVE
Protein, ur: NEGATIVE mg/dL
Specific Gravity, Urine: 1.018 (ref 1.005–1.030)
pH: 6 (ref 5.0–8.0)

## 2019-03-07 LAB — CBC WITH DIFFERENTIAL/PLATELET
Abs Immature Granulocytes: 0.02 10*3/uL (ref 0.00–0.07)
Basophils Absolute: 0 10*3/uL (ref 0.0–0.1)
Basophils Relative: 1 %
Eosinophils Absolute: 0.1 10*3/uL (ref 0.0–0.5)
Eosinophils Relative: 2 %
HCT: 42.5 % (ref 36.0–46.0)
Hemoglobin: 14.1 g/dL (ref 12.0–15.0)
Immature Granulocytes: 0 %
Lymphocytes Relative: 34 %
Lymphs Abs: 2.1 10*3/uL (ref 0.7–4.0)
MCH: 29.7 pg (ref 26.0–34.0)
MCHC: 33.2 g/dL (ref 30.0–36.0)
MCV: 89.5 fL (ref 80.0–100.0)
Monocytes Absolute: 0.7 10*3/uL (ref 0.1–1.0)
Monocytes Relative: 11 %
Neutro Abs: 3.3 10*3/uL (ref 1.7–7.7)
Neutrophils Relative %: 52 %
Platelets: 443 10*3/uL — ABNORMAL HIGH (ref 150–400)
RBC: 4.75 MIL/uL (ref 3.87–5.11)
RDW: 12.7 % (ref 11.5–15.5)
WBC: 6.3 10*3/uL (ref 4.0–10.5)
nRBC: 0 % (ref 0.0–0.2)

## 2019-03-07 LAB — GLUCOSE, CSF: Glucose, CSF: 58 mg/dL (ref 40–70)

## 2019-03-07 LAB — PROTEIN, CSF: Total  Protein, CSF: 16 mg/dL (ref 15–45)

## 2019-03-07 MED ORDER — LIDOCAINE-EPINEPHRINE (PF) 2 %-1:200000 IJ SOLN
10.0000 mL | Freq: Once | INTRAMUSCULAR | Status: AC
  Administered 2019-03-07: 10 mL
  Filled 2019-03-07: qty 20

## 2019-03-07 MED ORDER — SODIUM CHLORIDE 0.9 % IV BOLUS
1000.0000 mL | Freq: Once | INTRAVENOUS | Status: AC
  Administered 2019-03-07: 1000 mL via INTRAVENOUS

## 2019-03-07 MED ORDER — IOHEXOL 300 MG/ML  SOLN
75.0000 mL | Freq: Once | INTRAMUSCULAR | Status: AC | PRN
  Administered 2019-03-07: 75 mL via INTRAVENOUS

## 2019-03-07 MED ORDER — CLONAZEPAM 0.5 MG PO TABS: 0.5000 mg | ORAL_TABLET | Freq: Once | ORAL | Status: DC

## 2019-03-07 MED ORDER — PROCHLORPERAZINE EDISYLATE 10 MG/2ML IJ SOLN
10.0000 mg | INTRAMUSCULAR | Status: AC | PRN
  Administered 2019-03-07: 10 mg via INTRAVENOUS
  Filled 2019-03-07: qty 2

## 2019-03-07 MED ORDER — ACETAZOLAMIDE 250 MG PO TABS
500.0000 mg | ORAL_TABLET | Freq: Once | ORAL | Status: AC
  Administered 2019-03-07: 22:00:00 500 mg via ORAL
  Filled 2019-03-07: qty 2

## 2019-03-07 MED ORDER — DIPHENHYDRAMINE HCL 50 MG/ML IJ SOLN
25.0000 mg | Freq: Once | INTRAMUSCULAR | Status: AC
  Administered 2019-03-07: 25 mg via INTRAVENOUS
  Filled 2019-03-07: qty 1

## 2019-03-07 MED ORDER — ACETAZOLAMIDE 250 MG PO TABS: 500.0000 mg | ORAL_TABLET | Freq: Two times a day (BID) | ORAL | 0 refills | Status: DC

## 2019-03-07 MED ORDER — LORAZEPAM 2 MG/ML IJ SOLN
INTRAMUSCULAR | Status: AC
  Administered 2019-03-07: 1 mg
  Filled 2019-03-07: qty 1

## 2019-03-07 NOTE — ED Provider Notes (Signed)
.  Lumbar Puncture  Date/Time: 03/07/2019 6:37 PM Performed by: Pattricia Boss, MD Authorized by: Pattricia Boss, MD   Consent:    Consent obtained:  Written   Consent given by:  Patient   Risks discussed:  Infection, bleeding, headache, nerve damage, pain and repeat procedure   Alternatives discussed:  No treatment Pre-procedure details:    Procedure purpose:  Diagnostic   Preparation: Patient was prepped and draped in usual sterile fashion   Sedation:    Sedation type:  Anxiolysis Anesthesia (see MAR for exact dosages):    Anesthesia method:  Local infiltration   Local anesthetic:  Lidocaine 1% WITH epi Procedure details:    Lumbar space:  L3-L4 interspace   Patient position:  L lateral decubitus   Needle gauge:  22   Needle type:  Spinal needle - Quincke tip   Needle length (in):  3.5   Ultrasound guidance: no     Number of attempts:  1   Opening pressure (cm H2O):  53   Fluid appearance:  Clear   Tubes of fluid:  4   Total volume (ml):  10 Post-procedure:    Puncture site:  Adhesive bandage applied   Patient tolerance of procedure:  Tolerated well, no immediate complications      Pattricia Boss, MD 03/07/19 Lurline Hare

## 2019-03-07 NOTE — ED Provider Notes (Signed)
[facility]ENCY DEPARTMENT Provider Note   CSN: [id number] Arrival date & time: 03/07/19  1536    History   Chief Complaint Chief Complaint  Patient presents with  . Headache    HPI ARTHUR AYDELOTTE is a 33 y.o. female.     HPI   33 year old female, with a PMH of HTN, 3 months postpartum, presents with headache for 3 weeks.  Patient describes the headache as a pressure behind her left eye, nonradiating.  She denies any aggravating or alleviating factors.  She states headache is constant.  She has been seen and evaluated for this by her primary care doctor and urgent care.  She has high blood pressure and has been on metoprolol and amlodipine.  Patient states she saw an ophthalmologist yesterday and was told that her retinas were swollen, left greater than right.  She denies any vision loss but does state she is floaters in her left eye.  She denies any numbness, tingling, speech difficulty, gait difficulty.  She denies any fevers, chills, nausea, vomiting, abdominal pain.  She states she had a history of this many years ago but does not remember what she was diagnosed with or how she was treated.   Past Medical History:  Diagnosis Date  . Anxiety   . Depression    Postpartum Depression with 1st pregnancy  . Fibroids   . History of chlamydia   . PID (pelvic inflammatory disease)   . Pregnancy induced hypertension   . Trichomonas     Patient Active Problem List   Diagnosis Date Noted  . Chronic hypertension affecting pregnancy 11/27/2018  . HTN in pregnancy, chronic 11/26/2018  . Hypertension 11/22/2014  . Pelvic inflammatory disease (PID) 10/25/2014    Past Surgical History:  Procedure Laterality Date  . CERVICAL CERCLAGE    . DILATION AND CURETTAGE OF UTERUS       OB History    Gravida  6   Para  2   Term  1   Preterm  1   AB  3   Living  2     SAB  3   TAB  0   Ectopic  0   Multiple  0   Live Births  2            Home Medications    Prior to Admission medications   Medication Sig Start Date End Date Taking? Authorizing Provider  acetaZOLAMIDE (DIAMOX) 250 MG tablet Take 2 tablets (500 mg total) by mouth 2 (two) times daily. 03/07/19   Etter Sjogren, PA-C    Family History Family History  Problem Relation Age of Onset  . Hypertension Mother   . Hypertension Father     Social History Social History   Tobacco Use  . Smoking status: Never Smoker  . Smokeless tobacco: Never Used  Substance Use Topics  . Alcohol use: Not Currently  . Drug use: No     Allergies   Patient has no known allergies.   Review of Systems Review of Systems  Constitutional: Negative for chills and fever.  HENT: Negative for rhinorrhea and sore throat.   Eyes: Negative for visual disturbance.  Respiratory: Negative for cough and shortness of breath.   Cardiovascular: Negative for chest pain and leg swelling.  Gastrointestinal: Negative for abdominal pain, diarrhea, nausea and vomiting.  Genitourinary: Negative for dysuria, frequency and urgency.  Musculoskeletal: Negative for joint swelling and neck pain.  Skin: Negative for rash and wound.  Neurological: Positive for headaches. Negative for syncope and numbness.  All other systems reviewed and are negative.    Physical Exam Updated Vital Signs BP (!) 175/100 (BP Location: Right Arm)   Pulse 66   Temp 98.3 F (36.8 C) (Oral)   Resp 16   LMP 04/04/2018   SpO2 100%   Physical Exam Vitals signs and nursing note reviewed.  Constitutional:      Appearance: She is well-developed.  HENT:     Head: Normocephalic and atraumatic.  Eyes:     Conjunctiva/sclera: Conjunctivae normal.  Neck:     Musculoskeletal: Neck supple.  Cardiovascular:     Rate and Rhythm: Normal rate and regular rhythm.     Heart sounds: Normal heart sounds. No murmur.  Pulmonary:     Effort: Pulmonary effort is normal. No respiratory distress.     Breath sounds: Normal  breath sounds. No wheezing or rales.  Abdominal:     General: Bowel sounds are normal. There is no distension.     Palpations: Abdomen is soft.     Tenderness: There is no abdominal tenderness.  Musculoskeletal: Normal range of motion.        General: No tenderness or deformity.  Skin:    General: Skin is warm and dry.     Findings: No erythema or rash.  Neurological:     Mental Status: She is alert and oriented to person, place, and time.     Cranial Nerves: Cranial nerves are intact.     Sensory: Sensation is intact.     Motor: Motor function is intact.     Coordination: Coordination is intact.  Psychiatric:        Behavior: Behavior normal.      ED Treatments / Results  Labs (all labs ordered are listed, but only abnormal results are displayed) Labs Reviewed  CBC WITH DIFFERENTIAL/PLATELET - Abnormal; Notable for the following components:      Result Value   Platelets 443 (*)    All other components within normal limits  COMPREHENSIVE METABOLIC PANEL - Abnormal; Notable for the following components:   Creatinine, Ser 1.01 (*)    All other components within normal limits  URINALYSIS, ROUTINE W REFLEX MICROSCOPIC - Abnormal; Notable for the following components:   APPearance HAZY (*)    Hgb urine dipstick MODERATE (*)    All other components within normal limits  CSF CELL COUNT WITH DIFFERENTIAL - Abnormal; Notable for the following components:   Appearance, CSF CLEAR (*)    RBC Count, CSF 1 (*)    All other components within normal limits  CSF CELL COUNT WITH DIFFERENTIAL - Abnormal; Notable for the following components:   Appearance, CSF CLEAR (*)    RBC Count, CSF 1 (*)    All other components within normal limits  CSF CULTURE  GLUCOSE, CSF  PROTEIN, CSF    EKG None  Radiology Ct Head Wo Contrast  Result Date: 03/07/2019 CLINICAL DATA:  Acute onset severe headache EXAM: CT HEAD WITHOUT CONTRAST TECHNIQUE: Contiguous axial images were obtained from the base  of the skull through the vertex without intravenous contrast. COMPARISON:  None. FINDINGS: Brain: The ventricles are normal in size and configuration. There is no intracranial mass, hemorrhage, extra-axial fluid collection, or midline shift. Brain parenchyma appears unremarkable. There is no evident acute infarct. Vascular: There is no appreciable hyperdense vessel. No evident vascular calcifications. Skull: Bony calvarium appears intact. Sinuses/Orbits: There is mild mucosal thickening in several anterior ethmoid air  cells. There is opacification in the right frontal sinus. Other visualized paranasal sinuses are clear. Visualized orbits appear symmetric bilaterally. Other: Mastoid air cells are clear. IMPRESSION: Areas of paranasal sinus disease.  Study otherwise unremarkable. Electronically Signed   By: Lowella Grip III M.D.   On: 03/07/2019 17:22   Ct Venogram Head  Result Date: 03/07/2019 CLINICAL DATA:  Suspected dural venous thrombosis. EXAM: CT VENOGRAM HEAD CONTRAST:  73mL OMNIPAQUE IOHEXOL 300 MG/ML  SOLN COMPARISON:  None. FINDINGS: Superior sagittal sinus: Normal. Straight sinus: Normal. Inferior sagittal sinus, vein of Galen and internal cerebral veins: Normal. Transverse sinuses: Normal. Sigmoid sinuses: Normal. Visualized jugular veins: Normal. IMPRESSION: No dural venous sinus thrombosis. Electronically Signed   By: Ulyses Jarred M.D.   On: 03/07/2019 22:18    Procedures Procedures (including critical care time)  Medications Ordered in ED Medications  sodium chloride 0.9 % bolus 1,000 mL (0 mLs Intravenous Stopped 03/07/19 2236)  prochlorperazine (COMPAZINE) injection 10 mg (10 mg Intravenous Given 03/07/19 1900)  diphenhydrAMINE (BENADRYL) injection 25 mg (25 mg Intravenous Given 03/07/19 1900)  lidocaine-EPINEPHrine (XYLOCAINE W/EPI) 2 %-1:200000 (PF) injection 10 mL (10 mLs Infiltration Given by Other 03/07/19 1847)  LORazepam (ATIVAN) 2 MG/ML injection (1 mg  Given 03/07/19 1840)   iohexol (OMNIPAQUE) 300 MG/ML solution 75 mL (75 mLs Intravenous Contrast Given 03/07/19 1913)  acetaZOLAMIDE (DIAMOX) tablet 500 mg (500 mg Oral Given 03/07/19 2206)     Initial Impression / Assessment and Plan / ED Course  I have reviewed the triage vital signs and the nursing notes.  Pertinent labs & imaging results that were available during my care of the patient were reviewed by me and considered in my medical decision making (see chart for details).  Clinical Course as of Mar 06 1734  Sat Mar 07, 2019  1734 Spoke with Neuro Dr.  Leonel Ramsay.  He recommends a CT venogram of the head.  He would like an LP to document opening pressure but this can be done before CT venogram.  If pressure is elevated recommends Acetazolemide 500 mg twice daily.  Would like neuro follow-up outpatient in a couple of weeks.  [CK]    Clinical Course User Index [CK] Etter Sjogren, PA-C       Patient presented with a 3-week history of headache, retro-orbital behind the left eye.  Her neuro exam is nonfocal, nonlateralizing.  She has no vision loss.  Her vital signs are notable for hypertension, otherwise normal.  She was told by an ophthalmologist that her redness were swollen.  She was sent to the ED for further evaluation.  She had a head CT which shows no intracranial hemorrhage.  Her blood work shows no significant abnormalities.  Urine shows no evidence of UTI.  Called neurology and discussed the case with Dr. Reeves Forth.  History and physical most consistent with pseudotumor cerebri.  He recommended an LP with documented opening pressure.   If opening pressure was elevated he recommended acetazolamide 500 mg twice daily. He recommended either a CT venogram or an MR venogram.  If imaging was negative, he recommended follow-up outpatient in 2 to 3 weeks.  Case discussed with attending, Dr. Jeanell Sparrow.  She saw and evaluated the patient.  She performed an LP which showed a significantly increased opening  pressure of 53.  CSF shows no evidence of meningitis, subarachnoid hemorrhage.  Patient had a CT venogram which shows no evidence of venous sinus thrombosis.  Patient given 1 dose of acetazolamide in the ED.  She is feeling better.  Her vital signs are stable.  Her history and physical is most consistent with pseudotumor cerebri.  Patient has no neuro symptoms, her neuro exam is nonfocal, nonlateralizing.  Will start on acetazolamide for home.  Given strict return precautions.  Patient instructed to follow-up with neurology.  Patient agreeable with this plan.  She is ready and stable for discharge.   At this time there does not appear to be any evidence of an acute emergency medical condition and the patient appears stable for discharge with appropriate outpatient follow up.Diagnosis was discussed with patient who verbalizes understanding and is agreeable to discharge. Pt case discussed with Dr. Jeanell Sparrow who agrees with my plan.   Final Clinical Impressions(s) / ED Diagnoses   Final diagnoses:  Pseudotumor cerebri    ED Discharge Orders         Ordered    acetaZOLAMIDE (DIAMOX) 250 MG tablet  2 times daily     03/07/19 2230    Ambulatory referral to Neurology    Comments: An appointment is requested in approximately: 2-3 weeks   03/07/19 2234           Etter Sjogren, PA-C 03/07/19 2252    Pattricia Boss, MD 03/13/19 1345

## 2019-03-07 NOTE — ED Notes (Signed)
Patient verbalizes understanding of discharge instructions. Opportunity for questioning and answers were provided. Armband removed by staff, pt discharged from ED ambulatory to home.  

## 2019-03-07 NOTE — Discharge Instructions (Addendum)
Take Acetazolamide as prescribed.  Follow-up with your primary care provider within 1 week for continued evaluation of your high blood pressure.  Follow-up with neurology in 2 to 3 weeks for continued evaluation of your pseudotumor cerebri.  Return to the ED immediately for new or worsening symptoms or concerns, such as numbness, tingling, weakness, vision loss, fevers or any concerns at all.

## 2019-03-07 NOTE — ED Notes (Signed)
Patient transported to CT 

## 2019-03-07 NOTE — ED Triage Notes (Signed)
Patient has been seen by ophthalmology and her primary MD for headache and elevated BP. Has had 2 meds changed the past week for same. Patient alert and oriented, took meds today as prescribed. Was told yesterday retinas swollen and treated for same

## 2019-03-10 LAB — CSF CULTURE W GRAM STAIN: Culture: NO GROWTH

## 2019-04-03 ENCOUNTER — Encounter (HOSPITAL_COMMUNITY): Payer: Self-pay

## 2019-04-15 ENCOUNTER — Encounter

## 2019-04-15 ENCOUNTER — Ambulatory Visit: Payer: 59 | Admitting: Neurology

## 2019-07-08 ENCOUNTER — Encounter (HOSPITAL_BASED_OUTPATIENT_CLINIC_OR_DEPARTMENT_OTHER): Payer: Self-pay | Admitting: *Deleted

## 2019-07-08 ENCOUNTER — Other Ambulatory Visit: Payer: Self-pay

## 2019-07-11 ENCOUNTER — Other Ambulatory Visit (HOSPITAL_COMMUNITY)
Admission: RE | Admit: 2019-07-11 | Discharge: 2019-07-11 | Disposition: A | Discharge status: Home / Self Care | Payer: 59 | Source: Ambulatory Visit | Attending: Obstetrics and Gynecology | Admitting: Obstetrics and Gynecology

## 2019-07-11 DIAGNOSIS — Z01812 Encounter for preprocedural laboratory examination: Secondary | ICD-10-CM | POA: Diagnosis not present

## 2019-07-11 DIAGNOSIS — Z20828 Contact with and (suspected) exposure to other viral communicable diseases: Secondary | ICD-10-CM | POA: Insufficient documentation

## 2019-07-12 LAB — NOVEL CORONAVIRUS, NAA (HOSP ORDER, SEND-OUT TO REF LAB; TAT 18-24 HRS): SARS-CoV-2, NAA: NOT DETECTED

## 2019-07-14 ENCOUNTER — Encounter (HOSPITAL_BASED_OUTPATIENT_CLINIC_OR_DEPARTMENT_OTHER)
Admission: RE | Admit: 2019-07-14 | Discharge: 2019-07-14 | Disposition: A | Discharge status: Home / Self Care | Payer: 59 | Source: Ambulatory Visit | Attending: Obstetrics and Gynecology | Admitting: Obstetrics and Gynecology

## 2019-07-14 ENCOUNTER — Other Ambulatory Visit: Payer: Self-pay

## 2019-07-14 DIAGNOSIS — Z01812 Encounter for preprocedural laboratory examination: Secondary | ICD-10-CM | POA: Diagnosis not present

## 2019-07-14 DIAGNOSIS — Z79899 Other long term (current) drug therapy: Secondary | ICD-10-CM | POA: Diagnosis not present

## 2019-07-14 DIAGNOSIS — I10 Essential (primary) hypertension: Secondary | ICD-10-CM | POA: Diagnosis not present

## 2019-07-14 DIAGNOSIS — F329 Major depressive disorder, single episode, unspecified: Secondary | ICD-10-CM | POA: Diagnosis not present

## 2019-07-14 DIAGNOSIS — Z793 Long term (current) use of hormonal contraceptives: Secondary | ICD-10-CM | POA: Diagnosis not present

## 2019-07-14 DIAGNOSIS — Z302 Encounter for sterilization: Secondary | ICD-10-CM | POA: Diagnosis not present

## 2019-07-14 DIAGNOSIS — F419 Anxiety disorder, unspecified: Secondary | ICD-10-CM | POA: Diagnosis not present

## 2019-07-14 DIAGNOSIS — Z6841 Body Mass Index (BMI) 40.0 and over, adult: Secondary | ICD-10-CM | POA: Diagnosis not present

## 2019-07-14 LAB — TYPE AND SCREEN
ABO/RH(D): AB POS
Antibody Screen: NEGATIVE

## 2019-07-14 LAB — CBC
HCT: 40.6 % (ref 36.0–46.0)
Hemoglobin: 13.9 g/dL (ref 12.0–15.0)
MCH: 30.2 pg (ref 26.0–34.0)
MCHC: 34.2 g/dL (ref 30.0–36.0)
MCV: 88.3 fL (ref 80.0–100.0)
Platelets: 423 10*3/uL — ABNORMAL HIGH (ref 150–400)
RBC: 4.6 MIL/uL (ref 3.87–5.11)
RDW: 13 % (ref 11.5–15.5)
WBC: 6.6 10*3/uL (ref 4.0–10.5)
nRBC: 0 % (ref 0.0–0.2)

## 2019-07-14 LAB — POCT PREGNANCY, URINE: Preg Test, Ur: NEGATIVE

## 2019-07-14 NOTE — Progress Notes (Signed)

## 2019-07-15 ENCOUNTER — Encounter (HOSPITAL_BASED_OUTPATIENT_CLINIC_OR_DEPARTMENT_OTHER)
Admission: RE | Disposition: A | Discharge status: Home / Self Care | Payer: Self-pay | Source: Home / Self Care | Attending: Obstetrics and Gynecology

## 2019-07-15 ENCOUNTER — Ambulatory Visit (HOSPITAL_BASED_OUTPATIENT_CLINIC_OR_DEPARTMENT_OTHER): Payer: No Typology Code available for payment source | Admitting: Certified Registered Nurse Anesthetist

## 2019-07-15 ENCOUNTER — Other Ambulatory Visit: Payer: Self-pay

## 2019-07-15 ENCOUNTER — Ambulatory Visit (HOSPITAL_BASED_OUTPATIENT_CLINIC_OR_DEPARTMENT_OTHER)
Admission: RE | Admit: 2019-07-15 | Discharge: 2019-07-15 | Disposition: A | Discharge status: Home / Self Care | Payer: No Typology Code available for payment source | Attending: Obstetrics and Gynecology | Admitting: Obstetrics and Gynecology

## 2019-07-15 ENCOUNTER — Encounter (HOSPITAL_BASED_OUTPATIENT_CLINIC_OR_DEPARTMENT_OTHER): Payer: Self-pay

## 2019-07-15 DIAGNOSIS — Z302 Encounter for sterilization: Secondary | ICD-10-CM | POA: Diagnosis not present

## 2019-07-15 DIAGNOSIS — F419 Anxiety disorder, unspecified: Secondary | ICD-10-CM | POA: Insufficient documentation

## 2019-07-15 DIAGNOSIS — I10 Essential (primary) hypertension: Secondary | ICD-10-CM | POA: Insufficient documentation

## 2019-07-15 DIAGNOSIS — F329 Major depressive disorder, single episode, unspecified: Secondary | ICD-10-CM | POA: Insufficient documentation

## 2019-07-15 DIAGNOSIS — Z6841 Body Mass Index (BMI) 40.0 and over, adult: Secondary | ICD-10-CM | POA: Insufficient documentation

## 2019-07-15 DIAGNOSIS — Z793 Long term (current) use of hormonal contraceptives: Secondary | ICD-10-CM | POA: Insufficient documentation

## 2019-07-15 DIAGNOSIS — Z79899 Other long term (current) drug therapy: Secondary | ICD-10-CM | POA: Insufficient documentation

## 2019-07-15 HISTORY — PX: LAPAROSCOPIC BILATERAL SALPINGECTOMY: SHX5889

## 2019-07-15 SURGERY — SALPINGECTOMY, BILATERAL, LAPAROSCOPIC
Anesthesia: General | Laterality: Bilateral

## 2019-07-15 MED ORDER — IBUPROFEN 600 MG PO TABS: 600.0000 mg | ORAL_TABLET | Freq: Four times a day (QID) | ORAL | 1 refills | Status: AC | PRN

## 2019-07-15 MED ORDER — SCOPOLAMINE 1 MG/3DAYS TD PT72
1.0000 | MEDICATED_PATCH | TRANSDERMAL | Status: DC
  Administered 2019-07-15: 1.5 mg via TRANSDERMAL

## 2019-07-15 MED ORDER — SUGAMMADEX SODIUM 500 MG/5ML IV SOLN
INTRAVENOUS | Status: AC
  Filled 2019-07-15: qty 5

## 2019-07-15 MED ORDER — ONDANSETRON 4 MG PO TBDP
ORAL_TABLET | ORAL | Status: AC
  Filled 2019-07-15: qty 1

## 2019-07-15 MED ORDER — ONDANSETRON HCL 4 MG/2ML IJ SOLN
INTRAMUSCULAR | Status: AC
  Filled 2019-07-15: qty 2

## 2019-07-15 MED ORDER — HYDROMORPHONE HCL 1 MG/ML IJ SOLN
0.2500 mg | INTRAMUSCULAR | Status: DC | PRN
  Administered 2019-07-15: 0.25 mg via INTRAVENOUS
  Administered 2019-07-15: 15:00:00 0.5 mg via INTRAVENOUS
  Administered 2019-07-15: 0.25 mg via INTRAVENOUS

## 2019-07-15 MED ORDER — CEFAZOLIN SODIUM-DEXTROSE 2-4 GM/100ML-% IV SOLN
2.0000 g | INTRAVENOUS | Status: AC
  Administered 2019-07-15: 2 g via INTRAVENOUS

## 2019-07-15 MED ORDER — MIDAZOLAM HCL 2 MG/2ML IJ SOLN
INTRAMUSCULAR | Status: AC
  Filled 2019-07-15: qty 2

## 2019-07-15 MED ORDER — FENTANYL CITRATE (PF) 100 MCG/2ML IJ SOLN
INTRAMUSCULAR | Status: DC | PRN
  Administered 2019-07-15: 50 ug via INTRAVENOUS
  Administered 2019-07-15: 100 ug via INTRAVENOUS
  Administered 2019-07-15: 50 ug via INTRAVENOUS

## 2019-07-15 MED ORDER — OXYCODONE HCL 5 MG/5ML PO SOLN: 5.0000 mg | Freq: Once | ORAL | Status: AC | PRN

## 2019-07-15 MED ORDER — 0.9 % SODIUM CHLORIDE (POUR BTL) OPTIME
TOPICAL | Status: DC | PRN
  Administered 2019-07-15: 1000 mL

## 2019-07-15 MED ORDER — OXYCODONE HCL 5 MG PO TABS
5.0000 mg | ORAL_TABLET | Freq: Once | ORAL | Status: AC | PRN
  Administered 2019-07-15: 5 mg via ORAL

## 2019-07-15 MED ORDER — MIDAZOLAM HCL 2 MG/2ML IJ SOLN
INTRAMUSCULAR | Status: DC | PRN
  Administered 2019-07-15: 2 mg via INTRAVENOUS

## 2019-07-15 MED ORDER — CEFAZOLIN SODIUM-DEXTROSE 2-4 GM/100ML-% IV SOLN
INTRAVENOUS | Status: AC
  Filled 2019-07-15: qty 100

## 2019-07-15 MED ORDER — BUPIVACAINE HCL (PF) 0.25 % IJ SOLN
INTRAMUSCULAR | Status: AC
  Filled 2019-07-15: qty 30

## 2019-07-15 MED ORDER — PROPOFOL 10 MG/ML IV BOLUS
INTRAVENOUS | Status: DC | PRN
  Administered 2019-07-15: 200 mg via INTRAVENOUS

## 2019-07-15 MED ORDER — LIDOCAINE 2% (20 MG/ML) 5 ML SYRINGE
INTRAMUSCULAR | Status: DC | PRN
  Administered 2019-07-15: 80 mg via INTRAVENOUS

## 2019-07-15 MED ORDER — OXYCODONE-ACETAMINOPHEN 5-325 MG PO TABS: 1.0000 | ORAL_TABLET | ORAL | 0 refills | Status: DC | PRN

## 2019-07-15 MED ORDER — SUGAMMADEX SODIUM 200 MG/2ML IV SOLN
INTRAVENOUS | Status: DC | PRN
  Administered 2019-07-15: 300 mg via INTRAVENOUS

## 2019-07-15 MED ORDER — HYDROMORPHONE HCL 1 MG/ML IJ SOLN
INTRAMUSCULAR | Status: AC
  Filled 2019-07-15: qty 0.5

## 2019-07-15 MED ORDER — ONDANSETRON 4 MG PO TBDP
4.0000 mg | ORAL_TABLET | Freq: Once | ORAL | Status: AC
  Administered 2019-07-15: 4 mg via ORAL

## 2019-07-15 MED ORDER — OXYCODONE HCL 5 MG PO TABS
ORAL_TABLET | ORAL | Status: AC
  Filled 2019-07-15: qty 1

## 2019-07-15 MED ORDER — DEXAMETHASONE SODIUM PHOSPHATE 10 MG/ML IJ SOLN
INTRAMUSCULAR | Status: DC | PRN
  Administered 2019-07-15: 10 mg via INTRAVENOUS

## 2019-07-15 MED ORDER — DEXAMETHASONE SODIUM PHOSPHATE 10 MG/ML IJ SOLN
INTRAMUSCULAR | Status: AC
  Filled 2019-07-15: qty 1

## 2019-07-15 MED ORDER — BUPIVACAINE HCL (PF) 0.5 % IJ SOLN
INTRAMUSCULAR | Status: AC
  Filled 2019-07-15: qty 30

## 2019-07-15 MED ORDER — FENTANYL CITRATE (PF) 100 MCG/2ML IJ SOLN
INTRAMUSCULAR | Status: AC
  Filled 2019-07-15: qty 2

## 2019-07-15 MED ORDER — PROPOFOL 10 MG/ML IV BOLUS
INTRAVENOUS | Status: AC
  Filled 2019-07-15: qty 40

## 2019-07-15 MED ORDER — ONDANSETRON HCL 4 MG/2ML IJ SOLN
INTRAMUSCULAR | Status: DC | PRN
  Administered 2019-07-15: 4 mg via INTRAVENOUS

## 2019-07-15 MED ORDER — SCOPOLAMINE 1 MG/3DAYS TD PT72
MEDICATED_PATCH | TRANSDERMAL | Status: AC
  Filled 2019-07-15: qty 1

## 2019-07-15 MED ORDER — MEPERIDINE HCL 25 MG/ML IJ SOLN: 6.2500 mg | INTRAMUSCULAR | Status: DC | PRN

## 2019-07-15 MED ORDER — ROCURONIUM BROMIDE 50 MG/5ML IV SOSY
PREFILLED_SYRINGE | INTRAVENOUS | Status: DC | PRN
  Administered 2019-07-15: 100 mg via INTRAVENOUS

## 2019-07-15 MED ORDER — BUPIVACAINE HCL (PF) 0.25 % IJ SOLN
INTRAMUSCULAR | Status: DC | PRN
  Administered 2019-07-15: 30 mL

## 2019-07-15 MED ORDER — LACTATED RINGERS IV SOLN
INTRAVENOUS | Status: DC
  Administered 2019-07-15 (×2): via INTRAVENOUS

## 2019-07-15 MED ORDER — PROMETHAZINE HCL 25 MG/ML IJ SOLN: 6.2500 mg | INTRAMUSCULAR | Status: DC | PRN

## 2019-07-15 SURGICAL SUPPLY — 38 items
ADH SKN CLS APL DERMABOND .7 (GAUZE/BANDAGES/DRESSINGS) ×1
APL SRG 38 LTWT LNG FL B (MISCELLANEOUS) ×1
APPLICATOR ARISTA FLEXITIP XL (MISCELLANEOUS) ×3 IMPLANT
BAG SPEC RTRVL LRG 6X4 10 (ENDOMECHANICALS)
BLADE SURG 11 STRL SS (BLADE) ×3 IMPLANT
BRIEF STRETCH FOR OB PAD XXL (UNDERPADS AND DIAPERS) ×3 IMPLANT
CABLE HIGH FREQUENCY MONO STRZ (ELECTRODE) ×3 IMPLANT
CLOTH BEACON ORANGE TIMEOUT ST (SAFETY) ×3 IMPLANT
DERMABOND ADVANCED (GAUZE/BANDAGES/DRESSINGS) ×2
DERMABOND ADVANCED .7 DNX12 (GAUZE/BANDAGES/DRESSINGS) ×1 IMPLANT
DRSG OPSITE POSTOP 3X4 (GAUZE/BANDAGES/DRESSINGS) ×3 IMPLANT
DURAPREP 26ML APPLICATOR (WOUND CARE) ×3 IMPLANT
ELECT REM PT RETURN 9FT ADLT (ELECTROSURGICAL) ×3
ELECTRODE REM PT RTRN 9FT ADLT (ELECTROSURGICAL) ×1 IMPLANT
GLOVE BIO SURGEON STRL SZ7 (GLOVE) ×3 IMPLANT
GLOVE BIOGEL PI IND STRL 7.0 (GLOVE) ×4 IMPLANT
GLOVE BIOGEL PI INDICATOR 7.0 (GLOVE) ×8
GOWN STRL REUS W/TWL LRG LVL3 (GOWN DISPOSABLE) ×6 IMPLANT
HEMOSTAT ARISTA ABSORB 3G PWDR (HEMOSTASIS) ×3 IMPLANT
PACK LAPAROSCOPY BASIN (CUSTOM PROCEDURE TRAY) ×3 IMPLANT
PACK TRENDGUARD 450 HYBRID PRO (MISCELLANEOUS) IMPLANT
PAD OB MATERNITY 4.3X12.25 (PERSONAL CARE ITEMS) ×3 IMPLANT
POUCH SPECIMEN RETRIEVAL 10MM (ENDOMECHANICALS) IMPLANT
SCISSORS LAP 5X35 DISP (ENDOMECHANICALS) IMPLANT
SET IRRIG TUBING LAPAROSCOPIC (IRRIGATION / IRRIGATOR) ×2 IMPLANT
SET TUBE SMOKE EVAC HIGH FLOW (TUBING) ×3 IMPLANT
SHEARS HARMONIC ACE PLUS 36CM (ENDOMECHANICALS) ×3 IMPLANT
SLEEVE SCD COMPRESS KNEE MED (MISCELLANEOUS) ×3 IMPLANT
SLEEVE XCEL OPT CAN 5 100 (ENDOMECHANICALS) ×3 IMPLANT
SUT MNCRL AB 4-0 PS2 18 (SUTURE) ×3 IMPLANT
SUT VICRYL 0 UR6 27IN ABS (SUTURE) ×3 IMPLANT
TOWEL GREEN STERILE FF (TOWEL DISPOSABLE) ×6 IMPLANT
TRAY FOLEY W/BAG SLVR 14FR LF (SET/KITS/TRAYS/PACK) ×3 IMPLANT
TRENDGUARD 450 HYBRID PRO PACK (MISCELLANEOUS) ×3
TROCAR 5M 150ML BLDLS (TROCAR) ×2 IMPLANT
TROCAR XCEL NON-BLD 11X100MML (ENDOMECHANICALS) IMPLANT
TROCAR XCEL NON-BLD 5MMX100MML (ENDOMECHANICALS) ×3 IMPLANT
WARMER LAPAROSCOPE (MISCELLANEOUS) ×3 IMPLANT

## 2019-07-15 NOTE — Anesthesia Procedure Notes (Signed)
Procedure Name: Intubation Date/Time: 07/15/2019 12:56 PM Performed by: Genelle Bal, CRNA Pre-anesthesia Checklist: Patient identified, Emergency Drugs available, Suction available and Patient being monitored Patient Re-evaluated:Patient Re-evaluated prior to induction Oxygen Delivery Method: Circle system utilized Preoxygenation: Pre-oxygenation with 100% oxygen Induction Type: IV induction Ventilation: Mask ventilation without difficulty Laryngoscope Size: Miller and 2 Grade View: Grade I Tube type: Oral Number of attempts: 1 Airway Equipment and Method: Stylet and Oral airway Placement Confirmation: ETT inserted through vocal cords under direct vision,  positive ETCO2 and breath sounds checked- equal and bilateral Secured at: 21 cm Tube secured with: Tape Dental Injury: Teeth and Oropharynx as per pre-operative assessment

## 2019-07-15 NOTE — Anesthesia Postprocedure Evaluation (Signed)
Anesthesia Post Note  Patient: Karina [name]  Procedure(s) Performed: LAPAROSCOPIC BILATERAL SALPINGECTOMY (Bilateral )     Patient location during evaluation: PACU Anesthesia Type: General Level of consciousness: awake and alert Pain management: pain level controlled Vital Signs Assessment: post-procedure vital signs reviewed and stable Respiratory status: spontaneous breathing, nonlabored ventilation and respiratory function stable Cardiovascular status: blood pressure returned to baseline and stable Postop Assessment: no apparent nausea or vomiting Anesthetic complications: no    Last Vitals:  Vitals:   07/15/19 1515 07/15/19 1520  BP: (!) 128/99   Pulse: 63 (!) 57  Resp: 18 14  Temp:    SpO2: 98% 98%    Last Pain:  Vitals:   07/15/19 1500  TempSrc:   PainSc: 10-Worst pain ever                 Lynda Rainwater

## 2019-07-15 NOTE — Transfer of Care (Signed)
Immediate Anesthesia Transfer of Care Note  Patient: Karina [name]  Procedure(s) Performed: LAPAROSCOPIC BILATERAL SALPINGECTOMY (Bilateral )  Patient Location: PACU  Anesthesia Type:General  Level of Consciousness: drowsy and patient cooperative  Airway & Oxygen Therapy: Patient Spontanous Breathing and Patient connected to face mask oxygen  Post-op Assessment: Report given to RN and Post -op Vital signs reviewed and stable  Post vital signs: Reviewed and stable  Last Vitals:  Vitals Value Taken Time  BP 119/83   Temp    Pulse 54 07/15/19 1425  Resp 18 07/15/19 1425  SpO2 99 % 07/15/19 1425  Vitals shown include unvalidated device data.  Last Pain:  Vitals:   07/15/19 1126  TempSrc: Oral  PainSc: 0-No pain      Patients Stated Pain Goal: 4 ([phone] [id number])  Complications: No apparent anesthesia complications

## 2019-07-15 NOTE — Interval H&P Note (Signed)
See previous interval update.  Pt did more research and saw the decreased risk of ovarian cancer and she know would like to have the bilateral salpingectomy.  07/15/2019 12:43 PM  Karina [name]

## 2019-07-15 NOTE — Interval H&P Note (Signed)
History and Physical Interval Note:  07/15/2019 12:32 PM  Karina [name]  has presented today for surgery, with the diagnosis of Z30.09 sterilization.  The various methods of treatment have been discussed with the patient at a previous preop appt in the office.. After consideration of risks, benefits and other options for treatment, the patient has consented to  Procedure(s): LAPAROSCOPIC BILATERAL SALPINGECTOMY (Bilateral) as a surgical intervention at that time.    Pt states she has done research and she does not want to be at risk for hemorrhage and have more than 1 incision so she wants a tubal ligation.  Counseled pt that the risk was the same for both and the number of incisions would be the same.  Discussed risk of ectopic pregnancy and 1/200 rate of failure. Pt also informed that the salpingectomy decreased the risk of ovarian cancer. Pt states that is not a factor b/c she does not have a family h/o ovarian cancer.  She also denies wanting to leave the option open for a reversal.  After discussion, pt still wants a BTL b/c she does not want to take them out if it is not necessary.  Consent change to Bilateral tubal ligation.   The patient's history has been reviewed, patient examined, no change in status, stable for surgery.  I have reviewed the patient's chart and labs.  Questions were answered to the patient's satisfaction.     Thurnell Lose

## 2019-07-15 NOTE — Discharge Instructions (Signed)
Salpingectomy, Care After This sheet gives you information about how to care for yourself after your procedure. Your health care provider may also give you more specific instructions. If you have problems or questions, contact your health care provider. What can I expect after the procedure? After the procedure, it is common to have:  Pain in your abdomen.  Some light vaginal bleeding (spotting) for a few days.  Tiredness. Your recovery time will vary depending on which method your surgeon used for your surgery. Follow these instructions at home: Incision care   Follow instructions from your health care provider about how to take care of your incisions. Make sure you: ? Wash your hands with soap and water before and after you change your bandage (dressing). If soap and water are not available, use hand sanitizer. ? Change or remove your dressing as told by your health care provider. ? Leave any stitches (sutures), skin glue, or adhesive strips in place. These skin closures may need to stay in place for 2 weeks or longer. If adhesive strip edges start to loosen and curl up, you may trim the loose edges. Do not remove adhesive strips completely unless your health care provider tells you to do that.  Keep your dressing clean and dry.  Check your incision area every day for signs of infection. Check for: ? Redness, swelling, or pain that gets worse. ? Fluid or blood. ? Warmth. ? Pus or a bad smell. Activity  Rest as told by your health care provider.  Avoid sitting for a long time without moving. Get up to take short walks every 1-2 hours. This is important to improve blood flow and breathing. Ask for help if you feel weak or unsteady.  Return to your normal activities as told by your health care provider. Ask your health care provider what activities are safe for you.  Do not drive until your health care provider says that it is safe.  Do not lift anything that is heavier than 10 lb  (4.5 kg), or the limit that you are told, until your health care provider says that it is safe. This may be 2-6 weeks depending on your surgery.  Until your health care provider approves: ? Do not douche. ? Do not use tampons. ? Do not have sex. Medicines  Take over-the-counter and prescription medicines only as told by your health care provider.  Ask your health care provider if the medicine prescribed to you: ? Requires you to avoid driving or using heavy machinery. ? Can cause constipation. You may need to take actions to prevent or treat constipation, such as:  Drink enough fluid to keep your urine pale yellow.  Take over-the-counter or prescription medicines.  Eat foods that are high in fiber, such as beans, whole grains, and fresh fruits and vegetables.  Limit foods that are high in fat and processed sugars, such as fried or sweet foods. General instructions  Wear compression stockings as told by your health care provider. These stockings help to prevent blood clots and reduce swelling in your legs.  Do not use any products that contain nicotine or tobacco, such as cigarettes, e-cigarettes, and chewing tobacco. If you need help quitting, ask your health care provider.  Do not take baths, swim, or use a hot tub until your health care provider approves. You may take showers.  Keep all follow-up visits as told by your health care provider. This is important. Contact a health care provider if you have:  Pain  when you urinate.  Redness, swelling, or pain around an incision.  Fluid or blood coming from an incision.  Pus or a bad smell coming from an incision.  An incision that feels warm to the touch.  A fever.  Abdominal pain that gets worse or does not get better with medicine.  An incision that starts to break open.  A rash.  Light-headedness.  Nausea and vomiting. Get help right away if you:  Have pain in your chest or leg.  Develop shortness of  breath.  Faint.  Have increased or heavy vaginal bleeding, such as soaking a pad in an hour. Summary  After the procedure, it is common to feel tired, have some pain in your abdomen, and have some light vaginal bleeding for a few days.  Follow instructions from your health care provider about how to take care of your incisions.  Return to your normal activities as told by your health care provider. Ask your health care provider what activities are safe for you.  Do not douche, use tampons, or have sex until your health care provider approves.  Keep all follow-up visits as told by your health care provider. This information is not intended to replace advice given to you by your health care provider. Make sure you discuss any questions you have with your health care provider. Document Released: 11/17/2010 Document Revised: 08/04/2018 Document Reviewed: 08/04/2018 Elsevier Patient Education  [place] Instructions  Activity: Get plenty of rest for the remainder of the day. A responsible individual must stay with you for 24 hours following the procedure.  For the next 24 hours, DO NOT: -Drive a car -Paediatric nurse -Drink alcoholic beverages -Take any medication unless instructed by your physician -Make any legal decisions or sign important papers.  Meals: Start with liquid foods such as gelatin or soup. Progress to regular foods as tolerated. Avoid greasy, spicy, heavy foods. If nausea and/or vomiting occur, drink only clear liquids until the nausea and/or vomiting subsides. Call your physician if vomiting continues.  Special Instructions/Symptoms: Your throat may feel dry or sore from the anesthesia or the breathing tube placed in your throat during surgery. If this causes discomfort, gargle with warm salt water. The discomfort should disappear within 24 hours.  If you had a scopolamine patch placed behind your ear for the management of post-  operative nausea and/or vomiting:  1. The medication in the patch is effective for 72 hours, after which it should be removed.  Wrap patch in a tissue and discard in the trash. Wash hands thoroughly with soap and water. 2. You may remove the patch earlier than 72 hours if you experience unpleasant side effects which may include dry mouth, dizziness or visual disturbances. 3. Avoid touching the patch. Wash your hands with soap and water after contact with the patch.

## 2019-07-15 NOTE — Anesthesia Preprocedure Evaluation (Signed)
Anesthesia Evaluation  Patient identified by MRN, date of birth, ID band Patient awake    Reviewed: Allergy & Precautions, NPO status , Patient's Chart, lab work & pertinent test results  Airway Mallampati: II  TM Distance: >3 FB Neck ROM: Full    Dental no notable dental hx.    Pulmonary neg pulmonary ROS,    Pulmonary exam normal breath sounds clear to auscultation       Cardiovascular hypertension, Pt. on medications negative cardio ROS Normal cardiovascular exam Rhythm:Regular Rate:Normal     Neuro/Psych Anxiety Depression negative neurological ROS  negative psych ROS   GI/Hepatic negative GI ROS, Neg liver ROS,   Endo/Other  Morbid obesity  Renal/GU negative Renal ROS  negative genitourinary   Musculoskeletal negative musculoskeletal ROS (+)   Abdominal (+) + obese,   Peds negative pediatric ROS (+)  Hematology negative hematology ROS (+)   Anesthesia Other Findings   Reproductive/Obstetrics negative OB ROS                             Anesthesia Physical Anesthesia Plan  ASA: III  Anesthesia Plan: General   Post-op Pain Management:    Induction: Intravenous  PONV Risk Score and Plan: 3 and Ondansetron, Dexamethasone, Midazolam and Treatment may vary due to age or medical condition  Airway Management Planned: Oral ETT  Additional Equipment:   Intra-op Plan:   Post-operative Plan: Extubation in OR  Informed Consent: I have reviewed the patients History and Physical, chart, labs and discussed the procedure including the risks, benefits and alternatives for the proposed anesthesia with the patient or authorized representative who has indicated his/her understanding and acceptance.     Dental advisory given  Plan Discussed with: CRNA  Anesthesia Plan Comments:         Anesthesia Quick Evaluation

## 2019-07-15 NOTE — Interval H&P Note (Deleted)
History and Physical Interval Note:  07/15/2019 12:20 PM  Karina [name]  has presented today for surgery, with the diagnosis of Z30.09 sterilization.  The various methods of treatment have been discussed with the patient and family. After consideration of risks, benefits and other options for treatment, the patient has consented to  Procedure(s): LAPAROSCOPIC BILATERAL SALPINGECTOMY (Bilateral) as a surgical intervention.  The patient's history has been reviewed, patient examined, no change in status, stable for surgery.  I have reviewed the patient's chart and labs.  Questions were answered to the patient's satisfaction.     Thurnell Lose

## 2019-07-15 NOTE — H&P (Signed)
History of Present Illness  General:  33 y/o female presents for preoperative examination prior to bilateral salpingectomy. She denies abnormal vaginal discharge. Headaches have decreased. Not opposed to blood transfusion if necessary.   Current Medications  Taking   Camila(Norethindrone) 0.35 MG Tablet 1 tablet Orally Once a day   Provera(medroxyPROGESTERone) 10 MG Tablet 1 tablet with food Orally Once a day, Notes: prn   Amlodipine Besylate 10 MG Tablet 1 tablet Orally Once a day   Metoprolol Tartrate 50 MG Tablet 1 tablet with food Orally Once a day   Sertraline HCl 100 MG Tablet 1 tablet Orally Once a day   Contrave(Naltrexone-buPROPion HCl ER) 8-90 MG Tablet Extended Release 12 Hour 2 tablets Orally Twice a day   Discontinued   Nexplanon(Etonogestrel) 68 MG Implant as directed Subcutaneous   Venlafaxine HCl 25 MG Tablet 1 tablet with food Orally Twice a day   Unknown   Norethindrone 0.35 MG Tablet 1 tablet Orally Once a day   Labetalol HCl 200 MG Tablet 2 tablets Orally Every 8 hours   Procardia XL(NIFEdipine) 90 MG Tablet Extended Release 24 Hour 1 tablet Orally Once a day   Clonazepam 0.25 MG Tablet Disintegrating 1 tablet on the tongue and allow to dissolve 30 minutes before bedtime Orally BID prn anxiety, Notes: PRN   Zoloft(Sertraline HCl) 50 MG Tablet 1 tablet Orally once daily   Atarax(Vitamins A & D-Zinc Oxide) 25 mg tablet 1 tablet orally qhs prn sleep, Notes: Has not picked up refill   Medication List reviewed and reconciled with the patient    Past Medical History  HTN- [facility]) .   PCOS.   Anxiety/Depression.   Pseudotumor cerebri.           Surgical History  cerclage for preterm labor 2012   Family History  Father: alive, diagnosed with Diabetes  Mother: deceased, Hypertension  1 brother(s) , 3 sister(s) . 2 son(s) , 1 daughter(s) .   denies any GYN family cancer hx.   Social History  General:  Alcohol: yes,  occasionally.  Children: Boys, 2, girls, 1.  Tobacco use  cigarettes: Never smoked Tobacco history last updated 07/01/2019 Marital Status: single.  no Recreational drug use.  OCCUPATION: employed, Airline pilot.  Exercise: yes, 30 min daily.    Gyn History  Sexual activity currently sexually active.  Periods : 06-16-19.  LMP 06/16/19.  Birth control ocp.  Last pap smear date 02/2019-per pt (Dr. Laury Deep).  Denies H/O Last mammogram date N/A.  Denies H/O Abnormal pap smear.  STD [facility] 10/2016, 11/2016, Chl 2017.    OB History  Number of pregnancies 5.  miscarriages 2.  Pregnancy # 1 live birth, vaginal delivery, boy.  Pregnancy # 2 live birth, vaginal delivery, girl.  Pregnancy # 3 vaginal delivery, boy.    Allergies  N.K.D.A.   Hospitalization/Major Diagnostic Procedure  HTN in pregnancy   childbirth x2    Review of Systems  See scanned ROS form for details. Denies fever/chills, chest pain, SOB, headaches, numbness/tingling. No h/o complication with anesthesia, bleeding disorders or blood clots.   Vital Signs  Wt 244.3, Wt change 3.7 lb, Ht 65, BMI 40.65, Temp 96.4, Pulse sitting 60, BP sitting 130/80.   Physical Examination  GENERAL:  Patient appears alert and oriented.  General Appearance: well-appearing, well-developed, no acute distress.  Speech: clear.  LUNGS:  Auscultation: no wheezing/rhonchi/rales. CTA bilaterally.  HEART:  Heart sounds: normal. RRR. no murmur.  ABDOMEN:  General: soft nontender, nondistended, no masses.  FEMALE GENITOURINARY:  Pelvic Not examined.  EXTREMITIES:  General: No edema or calf tenderness.  Pt aware of scribe services today.   Assessments     1. Encounter for other preprocedural examination - Z01.818 (Primary)      Treatment  1. Encounter for other preprocedural examination  Notes: Pt counseled on R/B/A of procedure, including but not limited to infection, bleeding and injury to organs in the abdomen. Discussed recovery  time and procedure. Not opposed to blood transfusion if necessary. No concerning medical Dx that may complicate the procedure. Plan for f/u 2 week post op.

## 2019-07-15 NOTE — Brief Op Note (Signed)
07/15/2019  2:23 PM  PATIENT:  Imagene Riches  33 y.o. female  PRE-OPERATIVE DIAGNOSIS:  Z30.09 Desires sterilization  POST-OPERATIVE DIAGNOSIS:  Z30.09 Desires sterilization  PROCEDURE:  Procedure(s): LAPAROSCOPIC BILATERAL SALPINGECTOMY (Bilateral)  SURGEON:  Surgeon(s) and Role:    Thurnell Lose, MD - Primary  PHYSICIAN ASSISTANT:   ASSISTANTS: Technician x 2   ANESTHESIA:   local and general  EBL:  10 mL   BLOOD ADMINISTERED:none  DRAINS: Urinary Catheter (Foley)   LOCAL MEDICATIONS USED: 0.5% MARCAINE    and Amount: 30 ml  SPECIMEN:  Source of Specimen:  Bilateral fallopian tubes  DISPOSITION OF SPECIMEN:  PATHOLOGY  COUNTS:  YES  TOURNIQUET:  * No tourniquets in log *  DICTATION: .Other Dictation: Dictation Number [id number]  PLAN OF CARE: Discharge to home after PACU  PATIENT DISPOSITION:  PACU - hemodynamically stable.   Delay start of Pharmacological VTE agent (>24hrs) due to surgical blood loss or risk of bleeding: not applicable

## 2019-07-16 ENCOUNTER — Encounter (HOSPITAL_BASED_OUTPATIENT_CLINIC_OR_DEPARTMENT_OTHER): Payer: Self-pay | Admitting: Obstetrics and Gynecology

## 2019-07-16 NOTE — Op Note (Signed)
**Note Karina-Identified via Obfuscation** NAME: DEMELZA, [name] MEDICAL RECORD [id number] ACCOUNT [id number] DATE OF BIRTH:[birth year] FACILITY: [facility] LOCATION: [facility] PHYSICIAN:[facility] Al Decant, MD  OPERATIVE REPORT  DATE OF PROCEDURE:  07/15/2019  PREOPERATIVE DIAGNOSES:  Desires sterilization.  PROCEDURE:  Laparoscopic bilateral salpingectomy.  SURGEON:  Thurnell Lose, MD  ASSISTANT:  Technician x2.  ANESTHESIA:  Local and general.  ESTIMATED BLOOD LOSS:  10.  DRAINS:  Foley catheter.  LOCAL:  0.5% Marcaine, 30 mL.  SPECIMEN:  Bilateral fallopian tubes.  DISPOSITION OF SPECIMEN:  To pathology.  PATIENT DISPOSITION:  To PACU hemodynamically stable.  FINDINGS:  Normal uterus, fallopian tubes and ovaries bilaterally.  The left fallopian tube was significantly shorter and the fimbriae were clubbed at the end.  DESCRIPTION OF PROCEDURE:  The patient was identified in the holding area.  She was originally consented for bilateral tubal ligation.  She then did some research on the Internet and wanted to have a bilateral salpingectomy.  We discussed the risks and  benefits and she desired to have a tubal ligation.  She was taken to the operating room with IV running.  She underwent general anesthesia without complication.  She was then placed in dorsal lithotomy position and prepped and draped in a normal sterile  fashion.  Timeout was performed.  SCDs were on her legs and operating.  Ancef 2 g IV had been infused.  A Graves speculum was inserted into the vagina.  The anterior lip of the cervix was identified and grasped with the single-tooth tenaculum.  The Hulka uterine manipulator was then advanced easily through the cervix.  Prior to placing the manipulator, I  did a bimanual exam that revealed an anteverted uterus.  Attention was then turned to the abdomen.  Marcaine was injected at the umbilicus.  A 5 mm incision was made with the scalpel and the trocar was then advanced, 5 mm trocar.  When it was time to  insufflate, the machine did not register any actual flow and  the abdomen was not distending and we replaced the trocar several times.  We then got a longer trocar and the gas went in without any issue and the abdomen was insufflated.  It was increased to a max pressure of 18 to get appropriate distention because  15 was not enough.  Attention was turned to the fallopian tubes.  The fimbriated end of the left tube was grabbed with the million dollar grasper.  The Harmonic scalpel was then used to transect the mesosalpinx down to the interstitial portion of the fallopian tube.  Of  note, there was some bleeding between the ovary and the tube near the uterus that was cauterized with the Harmonic scalpel.  It slowed.  Then, the same thing was done on the other side.  There was also some bleeding noted.  I eventually used the  Kleppinger to cauterize the bleeding areas.  The bleeding looked stable.  It looked like it had stopped.  We copiously irrigated with the Najat, however, I did place some Arista.  I then released the gas and it still looked like the other.  On the left  side, it was still bleeding, so I went back in with the Kleppinger and I did not note any more bleeding.  The trocars were removed under direct visualization.  The pelvis was cleared of all fluid prior to that.  The 5 mm trocar stayed in place to get all of the gas out with some expiratory breaths as well.  The subcutaneous space was  then reapproximated with 4-0 Monocryl and the incisions were reapproximated with Dermabond on the skin.  I then removed the uterine Hulka manipulator.  There was no active bleeding from the tenaculum site.  All instruments were  removed from the vagina.  All instrument, sponge and needle counts were correct x3 and a negative wand test.  The patient was taken to the recovery room in stable condition.  Of note, I attempted to call her partner and I was unable to leave a message because the voice mailbox   had not been set up.  VN/NUANCE  D:07/15/2019 T:07/15/2019 JOB:[id number]

## 2019-07-17 LAB — SURGICAL PATHOLOGY

## 2020-12-11 ENCOUNTER — Ambulatory Visit (HOSPITAL_COMMUNITY)
Admission: EM | Admit: 2020-12-11 | Discharge: 2020-12-11 | Disposition: A | Discharge status: Home / Self Care | Payer: 59

## 2020-12-11 ENCOUNTER — Encounter (HOSPITAL_COMMUNITY): Payer: Self-pay

## 2020-12-11 ENCOUNTER — Other Ambulatory Visit: Payer: Self-pay

## 2020-12-11 DIAGNOSIS — R311 Benign essential microscopic hematuria: Secondary | ICD-10-CM

## 2020-12-11 DIAGNOSIS — Z3202 Encounter for pregnancy test, result negative: Secondary | ICD-10-CM

## 2020-12-11 DIAGNOSIS — R350 Frequency of micturition: Secondary | ICD-10-CM

## 2020-12-11 DIAGNOSIS — M549 Dorsalgia, unspecified: Secondary | ICD-10-CM | POA: Diagnosis not present

## 2020-12-11 DIAGNOSIS — R112 Nausea with vomiting, unspecified: Secondary | ICD-10-CM | POA: Diagnosis not present

## 2020-12-11 LAB — POC URINE PREG, ED: Preg Test, Ur: NEGATIVE

## 2020-12-11 LAB — POCT URINALYSIS DIPSTICK, ED / UC
Bilirubin Urine: NEGATIVE
Glucose, UA: NEGATIVE mg/dL
Ketones, ur: NEGATIVE mg/dL
Leukocytes,Ua: NEGATIVE
Nitrite: NEGATIVE
Protein, ur: NEGATIVE mg/dL
Specific Gravity, Urine: 1.015 (ref 1.005–1.030)
Urobilinogen, UA: 0.2 mg/dL (ref 0.0–1.0)
pH: 7 (ref 5.0–8.0)

## 2020-12-11 MED ORDER — TAMSULOSIN HCL 0.4 MG PO CAPS: 0.4000 mg | ORAL_CAPSULE | Freq: Every day | ORAL | 0 refills | Status: AC

## 2020-12-11 MED ORDER — ONDANSETRON 4 MG PO TBDP: 4.0000 mg | ORAL_TABLET | Freq: Three times a day (TID) | ORAL | 0 refills | Status: AC | PRN

## 2020-12-11 MED ORDER — NITROFURANTOIN MONOHYD MACRO 100 MG PO CAPS: 100.0000 mg | ORAL_CAPSULE | Freq: Two times a day (BID) | ORAL | 0 refills | Status: DC

## 2020-12-11 NOTE — ED Triage Notes (Signed)
Pt in with c/o left back and abdominal pain, urinary frequency and vomiting that has been going on for 1 week, but worsened yesterday  Pt states she took augmentin

## 2020-12-11 NOTE — ED Provider Notes (Addendum)
[name]    CSN: [id number] Arrival date & time: 12/11/20  1041      History   Chief Complaint Chief Complaint  Patient presents with  . Emesis  . Urinary Frequency  . Nausea  . Back Pain    HPI Karina [name] is a 35 y.o. female.   HPI  Urinary frequency: Pt reports that for the past week she has had symptoms of left sided flank and back pain, lower abdominal pain, urinary frequency and occasional vomiting. She reports that she stated taking Augmentin yesterday to try to help symptoms. She reports that symptoms do not feel quite the same as her typical kidney stones.  She feels that she has urinary tract infection.  No fevers, current abdominal pain, or bloody emesis.   Past Medical History:  Diagnosis Date  . Anxiety   . Depression    Postpartum Depression with 1st pregnancy  . Fibroids   . Hypertension   . PID (pelvic inflammatory disease)     Patient Active Problem List   Diagnosis Date Noted  . Chronic hypertension affecting pregnancy 11/27/2018  . HTN in pregnancy, chronic 11/26/2018  . Hypertension 11/22/2014  . Pelvic inflammatory disease (PID) 10/25/2014    Past Surgical History:  Procedure Laterality Date  . CERVICAL CERCLAGE    . DILATION AND CURETTAGE OF UTERUS    . LAPAROSCOPIC BILATERAL SALPINGECTOMY Bilateral 07/15/2019   Procedure: LAPAROSCOPIC BILATERAL SALPINGECTOMY;  Surgeon: Thurnell Lose, MD;  Location: [facility];  Service: Gynecology;  Laterality: Bilateral;    OB History    Gravida  6   Para  2   Term  1   Preterm  1   AB  3   Living  2     SAB  3   IAB  0   Ectopic  0   Multiple  0   Live Births  2            Home Medications    Prior to Admission medications   Medication Sig Start Date End Date Taking? Authorizing Provider  clonazePAM (KLONOPIN) 0.5 MG tablet Take half tablet twice daily as needed for anxiety 10/10/20  Yes [provider]  escitalopram (LEXAPRO)  20 MG tablet Take one a day 09/27/20  Yes [provider]  amLODipine (NORVASC) 10 MG tablet Take 10 mg by mouth daily.    [name], RN  ibuprofen (ADVIL) 600 MG tablet Take 1 tablet (600 mg total) by mouth every 6 (six) hours as needed. 07/15/19   Thurnell Lose, MD  metoprolol succinate (TOPROL-XL) 50 MG 24 hr tablet Take 50 mg by mouth daily. Take with or immediately following a meal.    [name], RN  oxyCODONE-acetaminophen (PERCOCET) 5-325 MG tablet Take 1 tablet by mouth every 4 (four) hours as needed for severe pain. 07/15/19   Thurnell Lose, MD  sertraline (ZOLOFT) 50 MG tablet Take 50 mg by mouth daily.    [name], RN    Family History Family History  Problem Relation Age of Onset  . Hypertension Mother   . Hypertension Father     Social History Social History   Tobacco Use  . Smoking status: Never Smoker  . Smokeless tobacco: Never Used  Vaping Use  . Vaping Use: Never used  Substance Use Topics  . Alcohol use: Not Currently  . Drug use: No     Allergies   Hydrochlorothiazide   Review of Systems  Review of Systems  As stated above in HPI Physical Exam Triage Vital Signs ED Triage Vitals  Enc Vitals Group     BP 12/11/20 1130 133/90     Pulse Rate 12/11/20 1130 65     Resp 12/11/20 1130 20     Temp 12/11/20 1130 98.4 F (36.9 C)     Temp src --      SpO2 12/11/20 1130 98 %     Weight --      Height --      Head Circumference --      Peak Flow --      Pain Score 12/11/20 1127 8     Pain Loc --      Pain Edu? --      Excl. in [place]? --    No data found.  Updated Vital Signs BP 133/90   Pulse 65   Temp 98.4 F (36.9 C)   Resp 20   LMP 11/21/2020 (Approximate)   SpO2 98%   Physical Exam Vitals and nursing note reviewed.  Constitutional:      General: She is not in acute distress.    Appearance: Normal appearance. She is obese. She is not ill-appearing, toxic-appearing or diaphoretic.  HENT:     Head:  Normocephalic and atraumatic.  Eyes:     Comments: No pallor  Cardiovascular:     Rate and Rhythm: Normal rate and regular rhythm.     Heart sounds: Normal heart sounds.  Pulmonary:     Effort: Pulmonary effort is normal.     Breath sounds: Normal breath sounds.  Abdominal:     General: Abdomen is flat. Bowel sounds are normal. There is no distension.     Palpations: Abdomen is soft. There is no mass.     Tenderness: There is no abdominal tenderness. There is no right CVA tenderness, left CVA tenderness, guarding or rebound.     Hernia: No hernia is present.  Musculoskeletal:     Cervical back: Neck supple.  Skin:    General: Skin is warm.  Neurological:     Mental Status: She is alert.      UC Treatments / Results  Labs (all labs ordered are listed, but only abnormal results are displayed) Labs Reviewed  POCT URINALYSIS DIPSTICK, ED / UC - Abnormal; Notable for the following components:      Result Value   Hgb urine dipstick TRACE (*)    All other components within normal limits  POC URINE PREG, ED    EKG   Radiology No results found.  Procedures Procedures (including critical care time)  Medications Ordered in UC Medications - No data to display  Initial Impression / Assessment and Plan / UC Course  I have reviewed the triage vital signs and the nursing notes.  Pertinent labs & imaging results that were available during my care of the patient were reviewed by me and considered in my medical decision making (see chart for details).     New.  Urine pregnancy test is negative. Urine culture pending.  Given her history and symptoms we are going to start her on Macrobid while we await urine culture.  In addition we are going to have her hydrate with water to help assist if this could potentially be a kidney stone as well.  Discussed red flag signs and symptoms.  She is allergic to Flomax so we will avoid this at this time.  Zofran also for her nausea.  Update: When  discussing the Flomax medication she states that HCTZ does not work for her but that she is not allergic to it.  She states that it was added on by her previous clinician to help with lower edema swelling however it did not help with this so it was added to her allergy list.  It did not cause her any additional swelling.  She would like to trial the Flomax as she believes that she is used this in the past with efficacy. Final Clinical Impressions(s) / UC Diagnoses   Final diagnoses:  None   Discharge Instructions   None    ED Prescriptions    None     PDMP not reviewed this encounter.   Hughie Closs, Hershal Coria 12/11/20 1211    Hughie Closs, PA-C 12/11/20 1215

## 2021-04-19 ENCOUNTER — Other Ambulatory Visit: Payer: Self-pay

## 2021-04-19 ENCOUNTER — Encounter (HOSPITAL_COMMUNITY): Payer: Self-pay

## 2021-04-19 ENCOUNTER — Ambulatory Visit (HOSPITAL_COMMUNITY)
Admission: EM | Admit: 2021-04-19 | Discharge: 2021-04-19 | Disposition: A | Discharge status: Home / Self Care | Payer: No Typology Code available for payment source

## 2021-04-19 DIAGNOSIS — H66002 Acute suppurative otitis media without spontaneous rupture of ear drum, left ear: Secondary | ICD-10-CM

## 2021-04-19 MED ORDER — AMOXICILLIN-POT CLAVULANATE 875-125 MG PO TABS: 1.0000 | ORAL_TABLET | Freq: Two times a day (BID) | ORAL | 0 refills | Status: DC

## 2021-04-19 NOTE — Discharge Instructions (Addendum)
-  Start the antibiotic-Augmentin (amoxicillin-clavulanate), 1 pill every 12 hours for 7 days.  You can take this with food like with breakfast and dinner.

## 2021-04-19 NOTE — ED Provider Notes (Signed)
[name]    CSN: [id number] Arrival date & time: 04/19/21  [id number]      History   Chief Complaint Chief Complaint  Patient presents with   Migraine   Otalgia    HPI Karina [name] is a 35 y.o. female presenting with left ear pain for 1.5 months, getting worse.  States the pain is now radiating down her neck.  Medical history otherwise noncontributory.  States it kind of feels like she has a pulled muscle. OTC tension headache medicine is providing some relief. Denies hearing changes, dizziness, tinnitus. Denies recent URI.  HPI  Past Medical History:  Diagnosis Date   Anxiety    Depression    Postpartum Depression with 1st pregnancy   Fibroids    Hypertension    PID (pelvic inflammatory disease)     Patient Active Problem List   Diagnosis Date Noted   Chronic hypertension affecting pregnancy 11/27/2018   HTN in pregnancy, chronic 11/26/2018   Hypertension 11/22/2014   Pelvic inflammatory disease (PID) 10/25/2014    Past Surgical History:  Procedure Laterality Date   CERVICAL CERCLAGE     DILATION AND CURETTAGE OF UTERUS     LAPAROSCOPIC BILATERAL SALPINGECTOMY Bilateral 07/15/2019   Procedure: LAPAROSCOPIC BILATERAL SALPINGECTOMY;  Surgeon: Thurnell Lose, MD;  Location: [place];  Service: Gynecology;  Laterality: Bilateral;    OB History     Gravida  6   Para  2   Term  1   Preterm  1   AB  3   Living  2      SAB  3   IAB  0   Ectopic  0   Multiple  0   Live Births  2            Home Medications    Prior to Admission medications   Medication Sig Start Date End Date Taking? Authorizing Provider  amoxicillin-clavulanate (AUGMENTIN) 875-125 MG tablet Take 1 tablet by mouth every 12 (twelve) hours. 04/19/21  Yes Hazel Sams, PA-C  FLUoxetine (PROZAC) 10 MG capsule Take 1 a day for a week, then 2 a day 04/13/21  Yes [provider]  acetaZOLAMIDE (DIAMOX) 250 MG tablet Take 250 mg by mouth 2  (two) times daily. 03/24/21   [provider]  amLODipine (NORVASC) 10 MG tablet Take 10 mg by mouth daily.    [name], RN  clonazePAM Bobbye [place]) 0.5 MG tablet Take half tablet twice daily as needed for anxiety 10/10/20   [provider]  escitalopram (LEXAPRO) 20 MG tablet Take one a day 09/27/20   [provider]  ibuprofen (ADVIL) 600 MG tablet Take 1 tablet (600 mg total) by mouth every 6 (six) hours as needed. 07/15/19   Thurnell Lose, MD  metoprolol succinate (TOPROL-XL) 50 MG 24 hr tablet Take 50 mg by mouth daily. Take with or immediately following a meal.    [name], RN  nitrofurantoin, macrocrystal-monohydrate, (MACROBID) 100 MG capsule Take 1 capsule (100 mg total) by mouth 2 (two) times daily. 12/11/20   Hughie Closs, PA-C  ondansetron (ZOFRAN ODT) 4 MG disintegrating tablet Take 1 tablet (4 mg total) by mouth every 8 (eight) hours as needed for nausea or vomiting. 12/11/20   Hughie Closs, PA-C  oxyCODONE-acetaminophen (PERCOCET) 5-325 MG tablet Take 1 tablet by mouth every 4 (four) hours as needed for severe pain. 07/15/19   Thurnell Lose, MD  sertraline (ZOLOFT) 50 MG tablet Take  50 mg by mouth daily.    [name], RN  tamsulosin (FLOMAX) 0.4 MG CAPS capsule Take 1 capsule (0.4 mg total) by mouth daily after supper. 12/11/20   Hughie Closs, PA-C    Family History Family History  Problem Relation Age of Onset   Hypertension Mother    Hypertension Father     Social History Social History   Tobacco Use   Smoking status: Never   Smokeless tobacco: Never  Vaping Use   Vaping Use: Never used  Substance Use Topics   Alcohol use: Not Currently   Drug use: No     Allergies   Hydrochlorothiazide   Review of Systems Review of Systems  HENT:  Positive for ear pain.   All other systems reviewed and are negative.   Physical Exam Triage Vital Signs ED Triage Vitals  Enc Vitals Group     BP 04/19/21 0858 (!)  148/102     Pulse Rate 04/19/21 0858 66     Resp 04/19/21 0858 18     Temp 04/19/21 0858 97.9 F (36.6 C)     Temp Source 04/19/21 0858 Oral     SpO2 04/19/21 0858 98 %     Weight --      Height --      Head Circumference --      Peak Flow --      Pain Score 04/19/21 0855 10     Pain Loc --      Pain Edu? --      Excl. in [place]? --    No data found.  Updated Vital Signs BP (!) 148/102 (BP Location: Left Arm)   Pulse 66   Temp 97.9 F (36.6 C) (Oral)   Resp 18   LMP 04/08/2021   SpO2 98%   Visual Acuity Right Eye Distance:   Left Eye Distance:   Bilateral Distance:    Right Eye Near:   Left Eye Near:    Bilateral Near:     Physical Exam Vitals reviewed.  Constitutional:      General: She is not in acute distress.    Appearance: Normal appearance. She is not ill-appearing.  HENT:     Head: Normocephalic and atraumatic.     Right Ear: Ear canal and external ear normal. No tenderness. No middle ear effusion. There is no impacted cerumen. Tympanic membrane is not perforated, erythematous, retracted or bulging.     Left Ear: Ear canal and external ear normal. Tenderness present.  No middle ear effusion. There is no impacted cerumen. Tympanic membrane is erythematous and bulging. Tympanic membrane is not perforated or retracted.     Ears:     Comments: R preauricular and cervical lymphadenopathy. No mastoid tenderness or swelling, ear is not displaced.    Nose: Nose normal. No congestion.     Mouth/Throat:     Mouth: Mucous membranes are moist.     Pharynx: Uvula midline. No oropharyngeal exudate or posterior oropharyngeal erythema.  Eyes:     Extraocular Movements: Extraocular movements intact.     Pupils: Pupils are equal, round, and reactive to light.  Cardiovascular:     Rate and Rhythm: Normal rate and regular rhythm.     Heart sounds: Normal heart sounds.  Pulmonary:     Effort: Pulmonary effort is normal.     Breath sounds: Normal breath sounds. No decreased  breath sounds, wheezing, rhonchi or rales.  Abdominal:     Palpations: Abdomen is soft.  Tenderness: There is no abdominal tenderness. There is no guarding or rebound.  Lymphadenopathy:     Cervical: Cervical adenopathy present.     Left cervical: Superficial cervical adenopathy present.  Neurological:     General: No focal deficit present.     Mental Status: She is alert and oriented to person, place, and time.  Psychiatric:        Mood and Affect: Mood normal.        Behavior: Behavior normal.        Thought Content: Thought content normal.        Judgment: Judgment normal.     UC Treatments / Results  Labs (all labs ordered are listed, but only abnormal results are displayed) Labs Reviewed - No data to display  EKG   Radiology No results found.  Procedures Procedures (including critical care time)  Medications Ordered in UC Medications - No data to display  Initial Impression / Assessment and Plan / UC Course  I have reviewed the triage vital signs and the nursing notes.  Pertinent labs & imaging results that were available during my care of the patient were reviewed by me and considered in my medical decision making (see chart for details).     This patient is a very pleasant 35 y.o. year old female presenting with L AOM. Afebrile, nontachy. States she is not pregnant or breastfeeding. Given symptoms x1.5 months and significant ear pain, will cover with augmentin as below. ED return precautions discussed. Patient verbalizes understanding and agreement.  .   Final Clinical Impressions(s) / UC Diagnoses   Final diagnoses:  Non-recurrent acute suppurative otitis media of left ear without spontaneous rupture of tympanic membrane     Discharge Instructions      -Start the antibiotic-Augmentin (amoxicillin-clavulanate), 1 pill every 12 hours for 7 days.  You can take this with food like with breakfast and dinner.      ED Prescriptions     Medication Sig  Dispense Auth. Provider   amoxicillin-clavulanate (AUGMENTIN) 875-125 MG tablet Take 1 tablet by mouth every 12 (twelve) hours. 14 tablet Hazel Sams, PA-C      PDMP not reviewed this encounter.   Hazel Sams, PA-C 04/19/21 [phone]

## 2021-04-19 NOTE — ED Triage Notes (Addendum)
Pt c/o head, neck and ear pain to left side. Pt states, "I had fluid build up in my brain last year" . Pt states neck feels like she pulled a muscle and has a a headache. Denies changes in vision.    Started: 1 month and a half ago

## 2021-05-10 ENCOUNTER — Encounter (HOSPITAL_COMMUNITY): Payer: Self-pay | Admitting: *Deleted

## 2021-05-10 ENCOUNTER — Ambulatory Visit (HOSPITAL_COMMUNITY)
Admission: EM | Admit: 2021-05-10 | Discharge: 2021-05-10 | Disposition: A | Discharge status: Home / Self Care | Payer: No Typology Code available for payment source | Attending: Physician Assistant | Admitting: Physician Assistant

## 2021-05-10 ENCOUNTER — Other Ambulatory Visit: Payer: Self-pay

## 2021-05-10 DIAGNOSIS — R0981 Nasal congestion: Secondary | ICD-10-CM | POA: Diagnosis not present

## 2021-05-10 DIAGNOSIS — H60502 Unspecified acute noninfective otitis externa, left ear: Secondary | ICD-10-CM | POA: Diagnosis not present

## 2021-05-10 DIAGNOSIS — J302 Other seasonal allergic rhinitis: Secondary | ICD-10-CM

## 2021-05-10 MED ORDER — FLUTICASONE PROPIONATE 50 MCG/ACT NA SUSP: 1.0000 | Freq: Every day | NASAL | 0 refills | Status: AC

## 2021-05-10 MED ORDER — OFLOXACIN 0.3 % OT SOLN: 5.0000 [drp] | Freq: Two times a day (BID) | OTIC | 0 refills | Status: AC

## 2021-05-10 MED ORDER — NAPROXEN 375 MG PO TABS: 375.0000 mg | ORAL_TABLET | Freq: Two times a day (BID) | ORAL | 0 refills | Status: AC

## 2021-05-10 NOTE — Discharge Instructions (Addendum)
I believe that you have an external ear infection.  Please use drops twice daily for 10 days.  Keep your ear facing upward for several minutes after putting drops in to ensure this is able to go throughout your ear canal.  I have called in Naprosyn which will help with pain and inflammation.  Do not take additional NSAIDs with this medication including aspirin, ibuprofen/Advil, naproxen/Aleve as it can cause stomach bleeding.  You can use Tylenol if needed for additional pain.  Avoid putting anything in or on the ear until symptoms resolve.  Avoid submerging her head in water until symptoms improve.  As we discussed, I would also recommend treating your allergies with an daily antihistamine as well as Flonase.  If you have persistent symptoms follow-up with ear nose and throat doctor as we discussed.  If anything worsens significantly you need to be seen immediately.

## 2021-05-10 NOTE — ED Triage Notes (Signed)
Pt reports she finished the anti-bx  and still has Lt ear pain. Pt reports a sound like the ocean in Lt ear.

## 2021-05-10 NOTE — ED Provider Notes (Signed)
[place]    CSN: [id number] Arrival date & time: 05/10/21  [id number]      History   Chief Complaint Chief Complaint  Patient presents with   Otalgia    HPI DAESHA SUTHERBY is a 35 y.o. female.   Patient presents today with a several month history of intermittent left ear pain.  She was last seen 04/19/2021 which when she was prescribed Augmentin which provided improvement but not resolution of symptoms.  Soon after completing course of medication symptoms recur or become more intense.  Pain is currently rated a 7 on a 0-10 pain scale, localized to left ear with radiation into neck, described as shooting, worse with rotation of head or palpation of external ear, no alleviating factors identified.  She has tried over-the-counter medications without improvement of symptoms.  She denies any history of diabetes or immunosuppression.  Denies any recent swimming, airplane travel, diving.  Denies history of recurrent ear infections and has not seen an ENT.  She denies any recent illness but does have mild congestion symptoms related to allergies for which she is taking antihistamine over-the-counter.  She is having difficulty with daily activities as using a headset significantly worsens pain.   Past Medical History:  Diagnosis Date   Anxiety    Depression    Postpartum Depression with 1st pregnancy   Fibroids    Hypertension    PID (pelvic inflammatory disease)     Patient Active Problem List   Diagnosis Date Noted   Chronic hypertension affecting pregnancy 11/27/2018   HTN in pregnancy, chronic 11/26/2018   Hypertension 11/22/2014   Pelvic inflammatory disease (PID) 10/25/2014    Past Surgical History:  Procedure Laterality Date   CERVICAL CERCLAGE     DILATION AND CURETTAGE OF UTERUS     LAPAROSCOPIC BILATERAL SALPINGECTOMY Bilateral 07/15/2019   Procedure: LAPAROSCOPIC BILATERAL SALPINGECTOMY;  Surgeon: Thurnell Lose, MD;  Location: [facility];   Service: Gynecology;  Laterality: Bilateral;    OB History     Gravida  6   Para  2   Term  1   Preterm  1   AB  3   Living  2      SAB  3   IAB  0   Ectopic  0   Multiple  0   Live Births  2            Home Medications    Prior to Admission medications   Medication Sig Start Date End Date Taking? Authorizing Provider  fluticasone (FLONASE) 50 MCG/ACT nasal spray Place 1 spray into both nostrils daily. 05/10/21  Yes Torris House K, PA-C  naproxen (NAPROSYN) 375 MG tablet Take 1 tablet (375 mg total) by mouth 2 (two) times daily. 05/10/21  Yes Tamas Suen K, PA-C  ofloxacin (FLOXIN) 0.3 % OTIC solution Place 5 drops into the left ear 2 (two) times daily. 05/10/21  Yes Aleksey Newbern K, PA-C  acetaZOLAMIDE (DIAMOX) 250 MG tablet Take 250 mg by mouth 2 (two) times daily. 03/24/21   [provider]  amLODipine (NORVASC) 10 MG tablet Take 10 mg by mouth daily.    [name], RN  clonazePAM Bobbye [place]) 0.5 MG tablet Take half tablet twice daily as needed for anxiety 10/10/20   [provider]  escitalopram (LEXAPRO) 20 MG tablet Take one a day 09/27/20   [provider]  FLUoxetine (PROZAC) 10 MG capsule Take 1 a day for a week, then 2 a  day 04/13/21   [provider]  ibuprofen (ADVIL) 600 MG tablet Take 1 tablet (600 mg total) by mouth every 6 (six) hours as needed. 07/15/19   Thurnell Lose, MD  metoprolol succinate (TOPROL-XL) 50 MG 24 hr tablet Take 50 mg by mouth daily. Take with or immediately following a meal.    [name], RN  ondansetron (ZOFRAN ODT) 4 MG disintegrating tablet Take 1 tablet (4 mg total) by mouth every 8 (eight) hours as needed for nausea or vomiting. 12/11/20   Hughie Closs, PA-C  oxyCODONE-acetaminophen (PERCOCET) 5-325 MG tablet Take 1 tablet by mouth every 4 (four) hours as needed for severe pain. 07/15/19   Thurnell Lose, MD  tamsulosin (FLOMAX) 0.4 MG CAPS capsule Take 1 capsule (0.4 mg total) by  mouth daily after supper. 12/11/20   Hughie Closs, PA-C    Family History Family History  Problem Relation Age of Onset   Hypertension Mother    Hypertension Father     Social History Social History   Tobacco Use   Smoking status: Never   Smokeless tobacco: Never  Vaping Use   Vaping Use: Never used  Substance Use Topics   Alcohol use: Not Currently   Drug use: No     Allergies   Hydrochlorothiazide   Review of Systems Review of Systems  Constitutional:  Positive for activity change. Negative for appetite change, fatigue and fever.  HENT:  Positive for congestion, ear pain and tinnitus. Negative for ear discharge, hearing loss, sinus pressure, sneezing and sore throat.   Respiratory:  Negative for cough and shortness of breath.   Cardiovascular:  Negative for chest pain.  Gastrointestinal:  Negative for abdominal pain, diarrhea, nausea and vomiting.  Musculoskeletal:  Positive for neck pain. Negative for arthralgias and myalgias.  Neurological:  Negative for dizziness, light-headedness and headaches.    Physical Exam Triage Vital Signs ED Triage Vitals  Enc Vitals Group     BP 05/10/21 0852 (!) 139/95     Pulse Rate 05/10/21 0852 68     Resp 05/10/21 0852 20     Temp 05/10/21 0852 97.7 F (36.5 C)     Temp src --      SpO2 05/10/21 0852 98 %     Weight --      Height --      Head Circumference --      Peak Flow --      Pain Score 05/10/21 0853 7     Pain Loc --      Pain Edu? --      Excl. in [place]? --    No data found.  Updated Vital Signs BP (!) 139/95   Pulse 68   Temp 97.7 F (36.5 C)   Resp 20   LMP 04/11/2021 Comment: Tubal  SpO2 98%   Visual Acuity Right Eye Distance:   Left Eye Distance:   Bilateral Distance:    Right Eye Near:   Left Eye Near:    Bilateral Near:     Physical Exam Vitals reviewed.  Constitutional:      General: She is awake. She is not in acute distress.    Appearance: Normal appearance. She is normal  weight. She is not ill-appearing.     Comments: Very pleasant female appears stated age no acute distress sitting comfortably in exam room  HENT:     Head: Normocephalic and atraumatic.     Right Ear: Tympanic membrane, ear canal and external  ear normal. Tympanic membrane is not erythematous or bulging.     Left Ear: Tympanic membrane normal. Swelling and tenderness present. Tympanic membrane is not erythematous or bulging.     Ears:     Comments: Left ear: Normal-appearing TM.  Significant swelling and edema of external auditory canal with tenderness to palpation of tragus and pain with manipulation of external ear.    Nose:     Right Sinus: No maxillary sinus tenderness or frontal sinus tenderness.     Left Sinus: No maxillary sinus tenderness or frontal sinus tenderness.     Mouth/Throat:     Pharynx: Uvula midline. Posterior oropharyngeal erythema present. No oropharyngeal exudate.  Cardiovascular:     Rate and Rhythm: Normal rate and regular rhythm.     Heart sounds: Normal heart sounds, S1 normal and S2 normal. No murmur heard. Pulmonary:     Effort: Pulmonary effort is normal.     Breath sounds: Normal breath sounds. No wheezing, rhonchi or rales.     Comments: Clear to auscultation Lymphadenopathy:     Head:     Right side of head: No submental, submandibular or tonsillar adenopathy.     Left side of head: No submental, submandibular or tonsillar adenopathy.     Cervical: No cervical adenopathy.  Psychiatric:        Behavior: Behavior is cooperative.     UC Treatments / Results  Labs (all labs ordered are listed, but only abnormal results are displayed) Labs Reviewed - No data to display  EKG   Radiology No results found.  Procedures Procedures (including critical care time)  Medications Ordered in UC Medications - No data to display  Initial Impression / Assessment and Plan / UC Course  I have reviewed the triage vital signs and the nursing notes.  Pertinent  labs & imaging results that were available during my care of the patient were reviewed by me and considered in my medical decision making (see chart for details).      Otitis media identified on physical exam.  Patient started on ofloxacin drops with instruction to keep ear facing upward after application of drops to ensure complete penetration throughout ear canal.  She was given a prescription for Naprosyn to help with pain and inflammation with instruction not to take additional NSAIDs with this medication.  Discussed that it is important that she manages her allergies and so was encouraged to use over-the-counter antihistamine and Flonase for additional symptom relief.  Discussed that if symptoms or not improving she would need to follow-up with ENT and was given contact information for local provider.  Discussed alarm symptoms that warrant emergent evaluation.  Recommended she avoid putting anything in the ear or submerging her head in water until symptoms resolve.  Strict return precautions given to which patient expressed understanding.  Final Clinical Impressions(s) / UC Diagnoses   Final diagnoses:  Acute otitis externa of left ear, unspecified type  Seasonal allergies  Nasal congestion     Discharge Instructions      I believe that you have an external ear infection.  Please use drops twice daily for 10 days.  Keep your ear facing upward for several minutes after putting drops in to ensure this is able to go throughout your ear canal.  I have called in Naprosyn which will help with pain and inflammation.  Do not take additional NSAIDs with this medication including aspirin, ibuprofen/Advil, naproxen/Aleve as it can cause stomach bleeding.  You can use Tylenol  if needed for additional pain.  Avoid putting anything in or on the ear until symptoms resolve.  Avoid submerging her head in water until symptoms improve.  As we discussed, I would also recommend treating your allergies with an  daily antihistamine as well as Flonase.  If you have persistent symptoms follow-up with ear nose and throat doctor as we discussed.  If anything worsens significantly you need to be seen immediately.     ED Prescriptions     Medication Sig Dispense Auth. Provider   ofloxacin (FLOXIN) 0.3 % OTIC solution Place 5 drops into the left ear 2 (two) times daily. 10 mL Edgard Debord K, PA-C   fluticasone (FLONASE) 50 MCG/ACT nasal spray Place 1 spray into both nostrils daily. 16 g Verl Whitmore K, PA-C   naproxen (NAPROSYN) 375 MG tablet Take 1 tablet (375 mg total) by mouth 2 (two) times daily. 20 tablet Coden Franchi, Derry Skill, PA-C      PDMP not reviewed this encounter.   Terrilee Croak, PA-C 05/10/21 [id number]

## 2021-08-01 ENCOUNTER — Ambulatory Visit (HOSPITAL_COMMUNITY)
Admission: EM | Admit: 2021-08-01 | Discharge: 2021-08-01 | Discharge status: Against Medical Advice | Payer: No Typology Code available for payment source

## 2021-08-01 ENCOUNTER — Telehealth: Payer: No Typology Code available for payment source

## 2021-08-01 ENCOUNTER — Other Ambulatory Visit: Payer: Self-pay

## 2021-10-18 ENCOUNTER — Other Ambulatory Visit: Payer: Self-pay

## 2021-10-18 ENCOUNTER — Emergency Department (HOSPITAL_BASED_OUTPATIENT_CLINIC_OR_DEPARTMENT_OTHER)
Admission: EM | Admit: 2021-10-18 | Discharge: 2021-10-18 | Disposition: A | Discharge status: Home / Self Care | Payer: No Typology Code available for payment source | Attending: Emergency Medicine | Admitting: Emergency Medicine

## 2021-10-18 ENCOUNTER — Encounter (HOSPITAL_BASED_OUTPATIENT_CLINIC_OR_DEPARTMENT_OTHER): Payer: Self-pay | Admitting: Emergency Medicine

## 2021-10-18 DIAGNOSIS — Z79899 Other long term (current) drug therapy: Secondary | ICD-10-CM | POA: Diagnosis not present

## 2021-10-18 DIAGNOSIS — R102 Pelvic and perineal pain: Secondary | ICD-10-CM | POA: Diagnosis present

## 2021-10-18 DIAGNOSIS — A599 Trichomoniasis, unspecified: Secondary | ICD-10-CM | POA: Insufficient documentation

## 2021-10-18 LAB — WET PREP, GENITAL
Sperm: NONE SEEN
WBC, Wet Prep HPF POC: <10 (ref <10)
Yeast Wet Prep HPF POC: NONE SEEN

## 2021-10-18 LAB — PREGNANCY, URINE: Preg Test, Ur: NEGATIVE

## 2021-10-18 MED ORDER — DOXYCYCLINE HYCLATE 100 MG PO TABS
100.0000 mg | ORAL_TABLET | Freq: Once | ORAL | Status: AC
  Administered 2021-10-18: 100 mg via ORAL
  Filled 2021-10-18: qty 1

## 2021-10-18 MED ORDER — CEFTRIAXONE SODIUM 500 MG IJ SOLR
500.0000 mg | Freq: Once | INTRAMUSCULAR | Status: AC
  Administered 2021-10-18: 500 mg via INTRAMUSCULAR
  Filled 2021-10-18: qty 500

## 2021-10-18 MED ORDER — METRONIDAZOLE 500 MG PO TABS
2000.0000 mg | ORAL_TABLET | Freq: Once | ORAL | Status: AC
  Administered 2021-10-18: 2000 mg via ORAL
  Filled 2021-10-18: qty 4

## 2021-10-18 MED ORDER — LIDOCAINE HCL (PF) 1 % IJ SOLN
INTRAMUSCULAR | Status: AC
  Administered 2021-10-18: 5 mL
  Filled 2021-10-18: qty 5

## 2021-10-18 MED ORDER — DOXYCYCLINE HYCLATE 100 MG PO CAPS: 100.0000 mg | ORAL_CAPSULE | Freq: Two times a day (BID) | ORAL | 0 refills | Status: AC

## 2021-10-18 NOTE — Discharge Instructions (Addendum)
Follow-up with your OB/GYN.  Continue to take antibiotics as prescribed.  Overall you have a cystic-like structure just inside of the vaginal wall on the right-hand side.  This does not appear to be an abscess or Bartholin gland cyst.  Not sure if this is some reactive tissue or lymph node.  Overall the painful structure has the same appearance as the rest of the vaginal wall.  Follow-up with your OB/GYN.  Please return if symptoms worsen.  You did test positive for trichomonas.  You have been treated for trichomonas, gonorrhea, chlamydia.

## 2021-10-18 NOTE — ED Triage Notes (Signed)
Pt reports sensation of something is stuck in vagina, but nothing is there. Similar sensation when she had a cyst in her vagina. Has already been checked for STDs. No vaginal discharge and is on very end of period.

## 2021-10-18 NOTE — ED Provider Notes (Addendum)
[facility] EMERGENCY DEPARTMENT Provider Note   CSN: [id number] Arrival date & time: 10/18/21  0746     History  Chief Complaint  Patient presents with   vagina issue    Karina [name] is a 36 y.o. female.  Patient states that she feels like something is in her vagina.  She states that nothing is in there however.  She had the sensation when she had a cyst in her vagina.  She denies any vaginal discharge.  Is on her menstrual cycle.  Denies any pain with urination.  Denies any abdominal pain, nausea, vomiting.  She states that she had something like this before sounds like she is treated with Rocephin and doxycycline and eventually went away.  The history is provided by the patient.  Female GU Problem This is a new problem. The problem occurs daily. The problem has not changed since onset.Nothing aggravates the symptoms. Nothing relieves the symptoms. She has tried nothing for the symptoms. The treatment provided no relief.      Home Medications Prior to Admission medications   Medication Sig Start Date End Date Taking? Authorizing Provider  amLODipine (NORVASC) 10 MG tablet Take 10 mg by mouth daily.   Yes [name], RN  clonazePAM Bobbye [place]) 0.5 MG tablet Take half tablet twice daily as needed for anxiety 10/10/20  Yes [provider]  doxycycline (VIBRAMYCIN) 100 MG capsule Take 1 capsule (100 mg total) by mouth 2 (two) times daily for 7 days. 10/18/21 10/25/21 Yes Mackynzie Woolford, DO  FLUoxetine (PROZAC) 10 MG capsule Take 1 a day for a week, then 2 a day 04/13/21  Yes [provider]  metoprolol succinate (TOPROL-XL) 50 MG 24 hr tablet Take 50 mg by mouth daily. Take with or immediately following a meal.   Yes [name], RN  acetaZOLAMIDE (DIAMOX) 250 MG tablet Take 250 mg by mouth 2 (two) times daily. 03/24/21   [provider]  escitalopram (LEXAPRO) 20 MG tablet Take one a day 09/27/20   [provider]  fluticasone  (FLONASE) 50 MCG/ACT nasal spray Place 1 spray into both nostrils daily. 05/10/21   Raspet, Derry Skill, PA-C  ibuprofen (ADVIL) 600 MG tablet Take 1 tablet (600 mg total) by mouth every 6 (six) hours as needed. 07/15/19   Thurnell Lose, MD  naproxen (NAPROSYN) 375 MG tablet Take 1 tablet (375 mg total) by mouth 2 (two) times daily. 05/10/21   Raspet, Derry Skill, PA-C  ofloxacin (FLOXIN) 0.3 % OTIC solution Place 5 drops into the left ear 2 (two) times daily. 05/10/21   Raspet, Erin K, PA-C  ondansetron (ZOFRAN ODT) 4 MG disintegrating tablet Take 1 tablet (4 mg total) by mouth every 8 (eight) hours as needed for nausea or vomiting. 12/11/20   Hughie Closs, PA-C  oxyCODONE-acetaminophen (PERCOCET) 5-325 MG tablet Take 1 tablet by mouth every 4 (four) hours as needed for severe pain. 07/15/19   Thurnell Lose, MD  tamsulosin (FLOMAX) 0.4 MG CAPS capsule Take 1 capsule (0.4 mg total) by mouth daily after supper. 12/11/20   Hughie Closs, PA-C      Allergies    Hydrochlorothiazide    Review of Systems   Review of Systems  Physical Exam Updated Vital Signs BP (!) 151/109    Pulse 84    Temp 98.8 F (37.1 C)    Resp 16    SpO2 100%  Physical Exam Exam conducted with a chaperone present.  Constitutional:  General: She is not in acute distress.    Appearance: She is not ill-appearing.  Cardiovascular:     Pulses: Normal pulses.     Heart sounds: Normal heart sounds.  Genitourinary:    Comments: Pelvic exam is overall unremarkable except that there is some sort of soft tissue tenderness just inside of the vaginal introitus, there is no purulent drainage or fluctuance and overall has the same tissue color of the surrounding vaginal tissue wall cervix is closed Skin:    General: Skin is warm.     Capillary Refill: Capillary refill takes less than 2 seconds.  Neurological:     Mental Status: She is alert.    ED Results / Procedures / Treatments   Labs (all labs ordered are listed, but  only abnormal results are displayed) Labs Reviewed  WET PREP, GENITAL - Abnormal; Notable for the following components:      Result Value   Trich, Wet Prep PRESENT (*)    Clue Cells Wet Prep HPF POC PRESENT (*)    All other components within normal limits  PREGNANCY, URINE  GC/CHLAMYDIA PROBE AMP ([place]) NOT AT [facility]    EKG None  Radiology No results found.  Procedures Procedures    Medications Ordered in ED Medications  cefTRIAXone (ROCEPHIN) injection 500 mg (has no administration in time range)  doxycycline (VIBRA-TABS) tablet 100 mg (has no administration in time range)  lidocaine (PF) (XYLOCAINE) 1 % injection (has no administration in time range)  metroNIDAZOLE (FLAGYL) tablet 2,000 mg (has no administration in time range)    ED Course/ Medical Decision Making/ A&P                           Medical Decision Making Amount and/or Complexity of Data Reviewed Labs: ordered.  Risk Prescription drug management.   Imagene Riches is here with vaginal pain.  Normal vitals.  No fever.  Just finished her menstrual cycle.  States that she is got sensation of pain and cystlike structure just inside of her vagina.  Had something like this recently in the past that self resolved but she did states she got treated with antibiotics.  Sounds like she get treated for STDs with Rocephin and doxycycline.  She on pelvic exam does have some sort of tenderness just inside of the vagina on the right-hand side.  She is tender to an area of soft tissue that is consistent with the tissue of the rest of the vaginal wall.  Not sure if this is an inflamed lymph node.  Does not look like an abscess.  It is not a Bartholin gland cyst.  Overall the exam is otherwise unremarkable.  Positive for trichomonas.  Will treat empirically for trichomonas, gonorrhea, chlamydia.  Told her that any partners need to be treated as well.  She has no abdominal tenderness.  She has no adnexal tenderness.  I have  no concern for TOA.  Pregnancy test is negative.  She has follow-up with her OB/GYN next week which I think is reasonable.  Discharged in good condition.  This chart was dictated using voice recognition software.  Despite best efforts to proofread,  errors can occur which can change the documentation meaning.         Final Clinical Impression(s) / ED Diagnoses Final diagnoses:  Vaginal pain  Trichomonas infection    Rx / DC Orders ED Discharge Orders  Ordered    doxycycline (VIBRAMYCIN) 100 MG capsule  2 times daily        10/18/21 0824              Lennice Sites, DO 10/18/21 3013    Lennice Sites, DO 10/18/21 1438    Lennice Sites, DO 10/18/21 [phone]

## 2021-10-19 LAB — GC/CHLAMYDIA PROBE AMP (~~LOC~~) NOT AT ARMC
Chlamydia: NEGATIVE
Comment: NEGATIVE
Comment: NORMAL
Neisseria Gonorrhea: NEGATIVE

## 2022-03-03 ENCOUNTER — Emergency Department (HOSPITAL_BASED_OUTPATIENT_CLINIC_OR_DEPARTMENT_OTHER)
Admission: EM | Admit: 2022-03-03 | Discharge: 2022-03-03 | Disposition: A | Discharge status: Home / Self Care | Payer: No Typology Code available for payment source | Attending: Emergency Medicine | Admitting: Emergency Medicine

## 2022-03-03 ENCOUNTER — Encounter (HOSPITAL_BASED_OUTPATIENT_CLINIC_OR_DEPARTMENT_OTHER): Payer: Self-pay | Admitting: Emergency Medicine

## 2022-03-03 ENCOUNTER — Other Ambulatory Visit: Payer: Self-pay

## 2022-03-03 ENCOUNTER — Emergency Department (HOSPITAL_BASED_OUTPATIENT_CLINIC_OR_DEPARTMENT_OTHER): Payer: No Typology Code available for payment source

## 2022-03-03 DIAGNOSIS — N200 Calculus of kidney: Secondary | ICD-10-CM | POA: Diagnosis not present

## 2022-03-03 DIAGNOSIS — A599 Trichomoniasis, unspecified: Secondary | ICD-10-CM | POA: Diagnosis not present

## 2022-03-03 DIAGNOSIS — R109 Unspecified abdominal pain: Secondary | ICD-10-CM | POA: Diagnosis present

## 2022-03-03 LAB — COMPREHENSIVE METABOLIC PANEL
ALT: 9 U/L (ref 0–44)
AST: 13 U/L — ABNORMAL LOW (ref 15–41)
Albumin: 3.7 g/dL (ref 3.5–5.0)
Alkaline Phosphatase: 76 U/L (ref 38–126)
Anion gap: 5 (ref 5–15)
BUN: 10 mg/dL (ref 6–20)
CO2: 27 mmol/L (ref 22–32)
Calcium: 8.9 mg/dL (ref 8.9–10.3)
Chloride: 104 mmol/L (ref 98–111)
Creatinine, Ser: 0.87 mg/dL (ref 0.44–1.00)
GFR, Estimated: >60 mL/min (ref >60)
Glucose, Bld: 92 mg/dL (ref 70–99)
Potassium: 3.5 mmol/L (ref 3.5–5.1)
Sodium: 136 mmol/L (ref 135–145)
Total Bilirubin: 0.5 mg/dL (ref 0.3–1.2)
Total Protein: 7.1 g/dL (ref 6.5–8.1)

## 2022-03-03 LAB — CBC WITH DIFFERENTIAL/PLATELET
Abs Immature Granulocytes: 0.01 10*3/uL (ref 0.00–0.07)
Basophils Absolute: 0.1 10*3/uL (ref 0.0–0.1)
Basophils Relative: 1 %
Eosinophils Absolute: 0.2 10*3/uL (ref 0.0–0.5)
Eosinophils Relative: 2 %
HCT: 40.4 % (ref 36.0–46.0)
Hemoglobin: 14 g/dL (ref 12.0–15.0)
Immature Granulocytes: 0 %
Lymphocytes Relative: 41 %
Lymphs Abs: 3.1 10*3/uL (ref 0.7–4.0)
MCH: 29.7 pg (ref 26.0–34.0)
MCHC: 34.7 g/dL (ref 30.0–36.0)
MCV: 85.8 fL (ref 80.0–100.0)
Monocytes Absolute: 0.8 10*3/uL (ref 0.1–1.0)
Monocytes Relative: 11 %
Neutro Abs: 3.4 10*3/uL (ref 1.7–7.7)
Neutrophils Relative %: 45 %
Platelets: 464 10*3/uL — ABNORMAL HIGH (ref 150–400)
RBC: 4.71 MIL/uL (ref 3.87–5.11)
RDW: 14.2 % (ref 11.5–15.5)
WBC: 7.5 10*3/uL (ref 4.0–10.5)
nRBC: 0 % (ref 0.0–0.2)

## 2022-03-03 LAB — LIPASE, BLOOD: Lipase: 65 U/L — ABNORMAL HIGH (ref 11–51)

## 2022-03-03 LAB — URINALYSIS, ROUTINE W REFLEX MICROSCOPIC
Bilirubin Urine: NEGATIVE
Glucose, UA: NEGATIVE mg/dL
Hgb urine dipstick: NEGATIVE
Ketones, ur: NEGATIVE mg/dL
Nitrite: NEGATIVE
Protein, ur: NEGATIVE mg/dL
Specific Gravity, Urine: 1.02 (ref 1.005–1.030)
pH: 7 (ref 5.0–8.0)

## 2022-03-03 LAB — PREGNANCY, URINE: Preg Test, Ur: NEGATIVE

## 2022-03-03 LAB — URINALYSIS, MICROSCOPIC (REFLEX)

## 2022-03-03 MED ORDER — FLUCONAZOLE 200 MG PO TABS
200.0000 mg | ORAL_TABLET | Freq: Every day | ORAL | 0 refills | Status: DC
  Filled 2022-03-03: qty 1, 1d supply, fill #0

## 2022-03-03 MED ORDER — METRONIDAZOLE 500 MG PO TABS: 500.0000 mg | ORAL_TABLET | Freq: Two times a day (BID) | ORAL | 0 refills | Status: DC

## 2022-03-03 MED ORDER — OXYCODONE-ACETAMINOPHEN 5-325 MG PO TABS
1.0000 | ORAL_TABLET | Freq: Four times a day (QID) | ORAL | 0 refills | Status: AC | PRN
  Filled 2022-03-03: qty 15, 4d supply, fill #0

## 2022-03-03 MED ORDER — FLUCONAZOLE 200 MG PO TABS: 200.0000 mg | ORAL_TABLET | Freq: Every day | ORAL | 0 refills | Status: AC

## 2022-03-03 MED ORDER — METRONIDAZOLE 500 MG PO TABS
500.0000 mg | ORAL_TABLET | Freq: Two times a day (BID) | ORAL | 0 refills | Status: DC
  Filled 2022-03-03: qty 14, 7d supply, fill #0

## 2022-03-03 NOTE — ED Triage Notes (Signed)
Left flank pain for the last four days.  Seen at [facility] a few days ago.  Given toradol and cipro.  Reports she feels like the stone is trying to pass.  UC did not do a ct just assumed due to prior history.

## 2022-03-03 NOTE — Discharge Instructions (Addendum)
Take medications as prescribed.  Do not drive or operate heavy machinery while taking the narcotic pain medicine.  Return for any new or worsening symptoms

## 2022-03-03 NOTE — ED Provider Notes (Signed)
[facility] EMERGENCY DEPARTMENT Provider Note   CSN: [id number] Arrival date & time: 03/03/22  1919    History  Chief Complaint  Patient presents with   Flank Pain    Karina [name] is a 36 y.o. female with with prior history of recurrent kidney stones here for evaluation of left flank pain.  Symptoms started over the last 3 days.  Initially seen at urgent care given Toradol as well as antibiotics for possible pyelonephritis.  Patient continues to have pain.  No dysuria or hematuria.  Pain feels similar to her prior kidney stones.  She has known trichomonas however has not been taking the antibiotic due to feeling unwell.  Also states she feels like she is getting a yeast infection from her antibiotics for trichomonas.  Denies any pelvic pain.  No fever, emesis.  HPI     Home Medications Prior to Admission medications   Medication Sig Start Date End Date Taking? Authorizing Provider  fluconazole (DIFLUCAN) 200 MG tablet Take 1 tablet (200 mg total) by mouth daily for 1 day. 03/03/22 03/04/22 Yes Ayyan Sites A, PA-C  metroNIDAZOLE (FLAGYL) 500 MG tablet Take 1 tablet (500 mg total) by mouth 2 (two) times daily. 03/03/22  Yes Aristide Waggle A, PA-C  oxyCODONE-acetaminophen (PERCOCET/ROXICET) 5-325 MG tablet Take 1 tablet by mouth every 6 (six) hours as needed for severe pain. 03/03/22  Yes Onis Markoff A, PA-C  acetaZOLAMIDE (DIAMOX) 250 MG tablet Take 250 mg by mouth 2 (two) times daily. 03/24/21   [provider]  amLODipine (NORVASC) 10 MG tablet Take 10 mg by mouth daily.    [name], RN  clonazePAM Bobbye [place]) 0.5 MG tablet Take half tablet twice daily as needed for anxiety 10/10/20   [provider]  escitalopram (LEXAPRO) 20 MG tablet Take one a day 09/27/20   [provider]  FLUoxetine (PROZAC) 10 MG capsule Take 1 a day for a week, then 2 a day 04/13/21   [provider]  fluticasone (FLONASE) 50 MCG/ACT nasal spray Place 1  spray into both nostrils daily. 05/10/21   Raspet, Derry Skill, PA-C  ibuprofen (ADVIL) 600 MG tablet Take 1 tablet (600 mg total) by mouth every 6 (six) hours as needed. 07/15/19   Thurnell Lose, MD  metoprolol succinate (TOPROL-XL) 50 MG 24 hr tablet Take 50 mg by mouth daily. Take with or immediately following a meal.    [name], RN  naproxen (NAPROSYN) 375 MG tablet Take 1 tablet (375 mg total) by mouth 2 (two) times daily. 05/10/21   Raspet, Derry Skill, PA-C  ofloxacin (FLOXIN) 0.3 % OTIC solution Place 5 drops into the left ear 2 (two) times daily. 05/10/21   Raspet, Erin K, PA-C  ondansetron (ZOFRAN ODT) 4 MG disintegrating tablet Take 1 tablet (4 mg total) by mouth every 8 (eight) hours as needed for nausea or vomiting. 12/11/20   Hughie Closs, PA-C  tamsulosin (FLOMAX) 0.4 MG CAPS capsule Take 1 capsule (0.4 mg total) by mouth daily after supper. 12/11/20   Hughie Closs, PA-C      Allergies    Hydrochlorothiazide    Review of Systems   Review of Systems  Constitutional: Negative.   HENT: Negative.    Respiratory: Negative.    Cardiovascular: Negative.   Gastrointestinal: Negative.   Genitourinary:  Positive for flank pain. Negative for decreased urine volume, difficulty urinating, dysuria, frequency, genital sores, hematuria, urgency, vaginal bleeding, vaginal discharge and vaginal pain.  Skin: Negative.   Neurological: Negative.   All other systems reviewed and are negative.  Physical Exam Updated Vital Signs BP (!) 138/102   Pulse 70   Temp 98.7 F (37.1 C) (Oral)   Resp 20   Ht '5\' 5"'$  (1.651 m)   Wt 109.3 kg   LMP 02/05/2022   SpO2 100%   BMI 40.10 kg/m  Physical Exam Vitals and nursing note reviewed.  Constitutional:      General: She is not in acute distress.    Appearance: She is well-developed. She is not ill-appearing, toxic-appearing or diaphoretic.  HENT:     Head: Normocephalic and atraumatic.     Nose: Nose normal.     Mouth/Throat:     Mouth:  Mucous membranes are moist.  Eyes:     Pupils: Pupils are equal, round, and reactive to light.  Cardiovascular:     Rate and Rhythm: Normal rate.     Pulses: Normal pulses.     Heart sounds: Normal heart sounds.  Pulmonary:     Effort: Pulmonary effort is normal. No respiratory distress.     Breath sounds: Normal breath sounds.  Abdominal:     General: Bowel sounds are normal. There is no distension.     Palpations: Abdomen is soft.     Tenderness: There is no abdominal tenderness. There is no right CVA tenderness, left CVA tenderness or guarding.  Musculoskeletal:        General: No swelling, tenderness, deformity or signs of injury. Normal range of motion.     Cervical back: Normal range of motion.     Right lower leg: No edema.     Left lower leg: No edema.  Skin:    General: Skin is warm and dry.     Capillary Refill: Capillary refill takes less than 2 seconds.  Neurological:     General: No focal deficit present.     Mental Status: She is alert.  Psychiatric:        Mood and Affect: Mood normal.    ED Results / Procedures / Treatments   Labs (all labs ordered are listed, but only abnormal results are displayed) Labs Reviewed  URINALYSIS, ROUTINE W REFLEX MICROSCOPIC - Abnormal; Notable for the following components:      Result Value   Leukocytes,Ua TRACE (*)    All other components within normal limits  CBC WITH DIFFERENTIAL/PLATELET - Abnormal; Notable for the following components:   Platelets 464 (*)    All other components within normal limits  COMPREHENSIVE METABOLIC PANEL - Abnormal; Notable for the following components:   AST 13 (*)    All other components within normal limits  LIPASE, BLOOD - Abnormal; Notable for the following components:   Lipase 65 (*)    All other components within normal limits  URINALYSIS, MICROSCOPIC (REFLEX) - Abnormal; Notable for the following components:   Bacteria, UA FEW (*)    Trichomonas, UA PRESENT (*)    All other  components within normal limits  PREGNANCY, URINE    EKG None  Radiology CT Renal Stone Study  Result Date: 03/03/2022 CLINICAL DATA:  Left-sided flank pain for several days, initial encounter EXAM: CT ABDOMEN AND PELVIS WITHOUT CONTRAST TECHNIQUE: Multidetector CT imaging of the abdomen and pelvis was performed following the standard protocol without IV contrast. RADIATION DOSE REDUCTION: This exam was performed according to the departmental dose-optimization program which includes automated exposure control, adjustment of the mA and/or kV according to patient size and/or  use of iterative reconstruction technique. COMPARISON:  01/03/2021 FINDINGS: Lower chest: No acute abnormality. Hepatobiliary: No focal liver abnormality is seen. No gallstones, gallbladder wall thickening, or biliary dilatation. Pancreas: Unremarkable. No pancreatic ductal dilatation or surrounding inflammatory changes. Spleen: Normal in size without focal abnormality. Adrenals/Urinary Tract: Adrenal glands are within normal limits. Kidneys are well visualized bilaterally. No renal calculi are seen. No obstructive changes are noted. The bladder is well distended. A small 2-3 mm dependent stone is noted likely recently passed which would correspond with the more long-standing pain history. No calculi are noted on the right. No obstructive changes are seen. Bladder is otherwise within normal limits. Stomach/Bowel: No obstructive or inflammatory changes of the colon are seen. The appendix is air-filled and within normal limits. Small bowel and stomach are unremarkable. Vascular/Lymphatic: No significant vascular findings are present. No enlarged abdominal or pelvic lymph nodes. Reproductive: Uterus and bilateral adnexa are unremarkable. Other: No abdominal wall hernia or abnormality. No abdominopelvic ascites. Musculoskeletal: No acute or significant osseous findings. IMPRESSION: 2-3 mm stone is noted in the bladder likely representing a  recently passed stone. This would correspond with the given clinical history of pain. No other focal abnormality is noted. Electronically Signed   By: Inez Catalina M.D.   On: 03/03/2022 21:54    Procedures Procedures    Medications Ordered in ED Medications - No data to display  ED Course/ Medical Decision Making/ A&P    36 year old here for evaluation of flank pain in setting of recurrent kidney stones.  She is afebrile, nonseptic, not ill-appearing.  Abdomen soft, nontender.  Labs and imaging personally viewed and interpreted:  CBC without leukocytosis CMP without significant abnormality Lipase 65 UA neg for infection, does shoe Trichomonas which patient is already aware of Preg neg CT stone study with 82m stone in bladder consistent  with recently passed stone  Patient reassessed. Has not wanted anything for pain.  Discussed results with patient.  She has known trichomonas.  DC home with symptomatic management infected stone, severe hydronephrosis or increase in renal function  On repeat exam patient does not have a surgical abdomin and there are no peritoneal signs.  No indication of appendicitis, bowel obstruction, bowel perforation, cholecystitis, diverticulitis, PID or ectopic pregnancy.    The patient has been appropriately medically screened and/or stabilized in the ED. I have low suspicion for any other emergent medical condition which would require further screening, evaluation or treatment in the ED or require inpatient management.  Patient is hemodynamically stable and in no acute distress.  Patient able to ambulate in department prior to ED.  Evaluation does not show acute pathology that would require ongoing or additional emergent interventions while in the emergency department or further inpatient treatment.  I have discussed the diagnosis with the patient and answered all questions.  Pain is been managed while in the emergency department and patient has no further  complaints prior to discharge.  Patient is comfortable with plan discussed in room and is stable for discharge at this time.  I have discussed strict return precautions for returning to the emergency department.  Patient was encouraged to follow-up with PCP/specialist refer to at discharge.                           Medical Decision Making Amount and/or Complexity of Data Reviewed External Data Reviewed: labs, radiology and notes. Labs: ordered. Decision-making details documented in ED Course. Radiology: ordered and  independent interpretation performed. Decision-making details documented in ED Course.  Risk OTC drugs. Prescription drug management. Parenteral controlled substances. Decision regarding hospitalization. Diagnosis or treatment significantly limited by social determinants of health.          Final Clinical Impression(s) / ED Diagnoses Final diagnoses:  Kidney stone  Trichomonas infection    Rx / DC Orders ED Discharge Orders          Ordered    oxyCODONE-acetaminophen (PERCOCET/ROXICET) 5-325 MG tablet  Every 6 hours PRN        03/03/22 2254    fluconazole (DIFLUCAN) 200 MG tablet  Daily        03/03/22 2254    metroNIDAZOLE (FLAGYL) 500 MG tablet  2 times daily        03/03/22 2254              Ainslie Mazurek A, PA-C 03/03/22 2255    Drenda Freeze, MD 03/04/22 1446

## 2022-03-03 NOTE — ED Notes (Signed)
Patient verbalizes understanding of discharge instructions. Opportunity for questioning and answers were provided. Armband removed by staff, pt discharged from ED. Ambulated out to lobby  

## 2022-03-05 ENCOUNTER — Other Ambulatory Visit (HOSPITAL_BASED_OUTPATIENT_CLINIC_OR_DEPARTMENT_OTHER): Payer: Self-pay

## 2022-06-07 ENCOUNTER — Emergency Department (HOSPITAL_BASED_OUTPATIENT_CLINIC_OR_DEPARTMENT_OTHER): Payer: No Typology Code available for payment source

## 2022-06-07 ENCOUNTER — Other Ambulatory Visit: Payer: Self-pay

## 2022-06-07 ENCOUNTER — Emergency Department (HOSPITAL_BASED_OUTPATIENT_CLINIC_OR_DEPARTMENT_OTHER)
Admission: EM | Admit: 2022-06-07 | Discharge: 2022-06-07 | Disposition: A | Discharge status: Home / Self Care | Payer: No Typology Code available for payment source | Attending: Emergency Medicine | Admitting: Emergency Medicine

## 2022-06-07 DIAGNOSIS — R519 Headache, unspecified: Secondary | ICD-10-CM | POA: Insufficient documentation

## 2022-06-07 DIAGNOSIS — S199XXA Unspecified injury of neck, initial encounter: Secondary | ICD-10-CM | POA: Diagnosis present

## 2022-06-07 DIAGNOSIS — X58XXXA Exposure to other specified factors, initial encounter: Secondary | ICD-10-CM | POA: Diagnosis not present

## 2022-06-07 DIAGNOSIS — M542 Cervicalgia: Secondary | ICD-10-CM | POA: Diagnosis not present

## 2022-06-07 DIAGNOSIS — H53149 Visual discomfort, unspecified: Secondary | ICD-10-CM | POA: Insufficient documentation

## 2022-06-07 DIAGNOSIS — S1980XA Other specified injuries of unspecified part of neck, initial encounter: Secondary | ICD-10-CM

## 2022-06-07 MED ORDER — IOHEXOL 350 MG/ML SOLN
100.0000 mL | Freq: Once | INTRAVENOUS | Status: AC | PRN
Start: 2022-06-07 — End: 2022-06-07
  Administered 2022-06-07: 100 mL via INTRAVENOUS

## 2022-06-07 MED ORDER — DIPHENHYDRAMINE HCL 50 MG/ML IJ SOLN
25.0000 mg | Freq: Once | INTRAMUSCULAR | Status: AC
  Administered 2022-06-07: 25 mg via INTRAVENOUS
  Filled 2022-06-07: qty 1

## 2022-06-07 MED ORDER — PROCHLORPERAZINE EDISYLATE 10 MG/2ML IJ SOLN
10.0000 mg | Freq: Once | INTRAMUSCULAR | Status: AC
  Administered 2022-06-07: 10 mg via INTRAVENOUS
  Filled 2022-06-07: qty 2

## 2022-06-07 NOTE — Discharge Instructions (Signed)
Your CT scan did not show any injury inside the head or to the blood vessels to the neck.  Please follow-up with your family doctor in the office.  Take 4 over the counter ibuprofen tablets 3 times a day or 2 over-the-counter naproxen tablets twice a day for pain. Also take tylenol '1000mg'$ (2 extra strength) four times a day.
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
## 2023-03-12 ENCOUNTER — Encounter (HOSPITAL_BASED_OUTPATIENT_CLINIC_OR_DEPARTMENT_OTHER): Payer: Self-pay | Admitting: Emergency Medicine

## 2023-03-12 ENCOUNTER — Emergency Department (HOSPITAL_BASED_OUTPATIENT_CLINIC_OR_DEPARTMENT_OTHER)
Admission: EM | Admit: 2023-03-12 | Discharge: 2023-03-12 | Disposition: A | Discharge status: Home / Self Care | Payer: No Typology Code available for payment source | Attending: Emergency Medicine | Admitting: Emergency Medicine

## 2023-03-12 DIAGNOSIS — R519 Headache, unspecified: Secondary | ICD-10-CM

## 2023-03-12 DIAGNOSIS — Z79899 Other long term (current) drug therapy: Secondary | ICD-10-CM | POA: Insufficient documentation

## 2023-03-12 DIAGNOSIS — J0100 Acute maxillary sinusitis, unspecified: Secondary | ICD-10-CM | POA: Diagnosis not present

## 2023-03-12 DIAGNOSIS — D75839 Thrombocytosis, unspecified: Secondary | ICD-10-CM | POA: Diagnosis not present

## 2023-03-12 HISTORY — DX: Benign intracranial hypertension: G93.2

## 2023-03-12 LAB — CBC WITH DIFFERENTIAL/PLATELET
Abs Immature Granulocytes: 0.02 10*3/uL (ref 0.00–0.07)
Basophils Absolute: 0 10*3/uL (ref 0.0–0.1)
Basophils Relative: 1 %
Eosinophils Absolute: 0.2 10*3/uL (ref 0.0–0.5)
Eosinophils Relative: 3 %
HCT: 41.1 % (ref 36.0–46.0)
Hemoglobin: 14.3 g/dL (ref 12.0–15.0)
Immature Granulocytes: 0 %
Lymphocytes Relative: 35 %
Lymphs Abs: 2 10*3/uL (ref 0.7–4.0)
MCH: 30.7 pg (ref 26.0–34.0)
MCHC: 34.8 g/dL (ref 30.0–36.0)
MCV: 88.2 fL (ref 80.0–100.0)
Monocytes Absolute: 0.5 10*3/uL (ref 0.1–1.0)
Monocytes Relative: 9 %
Neutro Abs: 2.9 10*3/uL (ref 1.7–7.7)
Neutrophils Relative %: 52 %
Platelets: 443 10*3/uL — ABNORMAL HIGH (ref 150–400)
RBC: 4.66 MIL/uL (ref 3.87–5.11)
RDW: 13 % (ref 11.5–15.5)
WBC: 5.7 10*3/uL (ref 4.0–10.5)
nRBC: 0 % (ref 0.0–0.2)

## 2023-03-12 LAB — BASIC METABOLIC PANEL
Anion gap: 6 (ref 5–15)
BUN: 10 mg/dL (ref 6–20)
CO2: 27 mmol/L (ref 22–32)
Calcium: 9.5 mg/dL (ref 8.9–10.3)
Chloride: 106 mmol/L (ref 98–111)
Creatinine, Ser: 0.88 mg/dL (ref 0.44–1.00)
GFR, Estimated: >60 mL/min (ref >60)
Glucose, Bld: 88 mg/dL (ref 70–99)
Potassium: 3.8 mmol/L (ref 3.5–5.1)
Sodium: 139 mmol/L (ref 135–145)

## 2023-03-12 LAB — PREGNANCY, URINE: Preg Test, Ur: NEGATIVE

## 2023-03-12 MED ORDER — SODIUM CHLORIDE 0.9 % IV BOLUS
1000.0000 mL | Freq: Once | INTRAVENOUS | Status: AC
  Administered 2023-03-12: 1000 mL via INTRAVENOUS

## 2023-03-12 MED ORDER — DIPHENHYDRAMINE HCL 50 MG/ML IJ SOLN
12.5000 mg | Freq: Once | INTRAMUSCULAR | Status: AC
Start: 2023-03-12 — End: 2023-03-12
  Administered 2023-03-12: 12.5 mg via INTRAVENOUS
  Filled 2023-03-12: qty 1

## 2023-03-12 MED ORDER — AMOXICILLIN-POT CLAVULANATE 875-125 MG PO TABS: 1.0000 | ORAL_TABLET | Freq: Two times a day (BID) | ORAL | 0 refills | Status: AC

## 2023-03-12 MED ORDER — METOCLOPRAMIDE HCL 5 MG/ML IJ SOLN
10.0000 mg | Freq: Once | INTRAMUSCULAR | Status: AC
Start: 2023-03-12 — End: 2023-03-12
  Administered 2023-03-12: 10 mg via INTRAVENOUS
  Filled 2023-03-12: qty 2

## 2023-03-12 MED ORDER — SODIUM CHLORIDE 0.9 % IV SOLN: INTRAVENOUS | Status: DC

## 2023-03-12 NOTE — ED Notes (Signed)
 Pt refusing covid test

## 2023-03-12 NOTE — Discharge Instructions (Addendum)
Your symptoms of sinus congestion for the past 2 weeks, left-sided maxillary sinus tenderness with associated headache and facial pain is consistent with likely developing sinus infection.  Additionally, lower suspicion for recurrence of your IIH given your reassuring neurologic exam, lack of symptoms over the past year, however, recommend you follow-up outpatient with neurology.

## 2023-03-12 NOTE — ED Provider Notes (Signed)
EMERGENCY DEPARTMENT AT [facility] Provider Note   CSN: [id number] Arrival date & time: 03/12/23  6962     History  Chief Complaint  Patient presents with   Headache    Karina [name] is a 37 y.o. female.   Headache    37 year old female with medical history significant for IIH who presents to the emergency department with a headache.  The patient has had a headache since this past Wednesday.  She states that she has a history of of IIH and had previously been on acetazolamide.  She stopped taking the medicine over a year and a half ago because she ran out and did not follow-up with neurology.  She has had no headaches since then.  She denies any visual field deficits, no vision changes, no blurry vision, no double vision.  No facial droop, numbness or weakness.  She denies any fever or chills or neck stiffness. She does state that for the past 2 weeks she has also had nasal congestion and drainage.  She endorses left-sided facial pain.  Home Medications Prior to Admission medications   Medication Sig Start Date End Date Taking? Authorizing Provider  amoxicillin-clavulanate (AUGMENTIN) 875-125 MG tablet Take 1 tablet by mouth every 12 (twelve) hours. 03/12/23  Yes Ernie Avena, MD  acetaZOLAMIDE (DIAMOX) 250 MG tablet Take 250 mg by mouth 2 (two) times daily. 03/24/21   [provider]  amLODipine (NORVASC) 10 MG tablet Take 10 mg by mouth daily.    Alver Fisher, RN  clonazePAM Scarlette Calico) 0.5 MG tablet Take half tablet twice daily as needed for anxiety 10/10/20   [provider]  escitalopram (LEXAPRO) 20 MG tablet Take one a day 09/27/20   [provider]  FLUoxetine (PROZAC) 10 MG capsule Take 1 a day for a week, then 2 a day 04/13/21   [provider]  fluticasone (FLONASE) 50 MCG/ACT nasal spray Place 1 spray into both nostrils daily. 05/10/21   Raspet, Noberto Retort, PA-C  ibuprofen (ADVIL) 600 MG tablet Take 1 tablet (600 mg  total) by mouth every 6 (six) hours as needed. 07/15/19   Geryl Rankins, MD  metoprolol succinate (TOPROL-XL) 50 MG 24 hr tablet Take 50 mg by mouth daily. Take with or immediately following a meal.    Alver Fisher, RN  naproxen (NAPROSYN) 375 MG tablet Take 1 tablet (375 mg total) by mouth 2 (two) times daily. 05/10/21   Raspet, Noberto Retort, PA-C  ofloxacin (FLOXIN) 0.3 % OTIC solution Place 5 drops into the left ear 2 (two) times daily. 05/10/21   Raspet, Erin K, PA-C  ondansetron (ZOFRAN ODT) 4 MG disintegrating tablet Take 1 tablet (4 mg total) by mouth every 8 (eight) hours as needed for nausea or vomiting. 12/11/20   Rushie Chestnut, PA-C  oxyCODONE-acetaminophen (PERCOCET/ROXICET) 5-325 MG tablet Take 1 tablet by mouth every 6 (six) hours as needed for severe pain. 03/03/22   Henderly, Britni A, PA-C  tamsulosin (FLOMAX) 0.4 MG CAPS capsule Take 1 capsule (0.4 mg total) by mouth daily after supper. 12/11/20   Rushie Chestnut, PA-C      Allergies    Hydrochlorothiazide and Phentermine    Review of Systems   Review of Systems  Neurological:  Positive for headaches.  All other systems reviewed and are negative.   Physical Exam Updated Vital Signs BP (!) 157/117   Pulse 66   Temp 98 F (36.7 C) (Oral)   Resp 16  Ht 5\' 5"  (1.651 m)   LMP 03/05/2023   SpO2 100%   BMI 39.11 kg/m  Physical Exam Vitals and nursing note reviewed.  Constitutional:      General: She is not in acute distress.    Appearance: She is well-developed.  HENT:     Head: Normocephalic and atraumatic.     Nose:     Left Sinus: Maxillary sinus tenderness and frontal sinus tenderness present.  Eyes:     Conjunctiva/sclera: Conjunctivae normal.  Cardiovascular:     Rate and Rhythm: Normal rate and regular rhythm.  Pulmonary:     Effort: Pulmonary effort is normal. No respiratory distress.     Breath sounds: Normal breath sounds.  Abdominal:     Palpations: Abdomen is soft.     Tenderness: There is no  abdominal tenderness.  Musculoskeletal:        General: No swelling.     Cervical back: Neck supple.  Skin:    General: Skin is warm and dry.     Capillary Refill: Capillary refill takes less than 2 seconds.  Neurological:     Mental Status: She is alert.     Comments: MENTAL STATUS EXAM:    Orientation: Alert and oriented to person, place and time.  Memory: Cooperative, follows commands well.  Language: Speech is clear and language is normal.   CRANIAL NERVES:    CN 2 (Optic): Visual fields intact to confrontation.  CN 3,4,6 (EOM): Pupils equal and reactive to light. Full extraocular eye movement without nystagmus.  CN 5 (Trigeminal): Facial sensation is normal, no weakness of masticatory muscles.  CN 7 (Facial): No facial weakness or asymmetry.  CN 8 (Auditory): Auditory acuity grossly normal.  CN 9,10 (Glossophar): The uvula is midline, the palate elevates symmetrically.  CN 11 (spinal access): Normal sternocleidomastoid and trapezius strength.  CN 12 (Hypoglossal): The tongue is midline. No atrophy or fasciculations.Marland Kitchen   MOTOR:  Muscle Strength: 5/5RUE, 5/5LUE, 5/5RLE, 5/5LLE  COORDINATION:   Intact finger-to-nose, no tremor, no pronator drift.   SENSATION:   Intact to light touch all four extremities.  GAIT: Gait normal without ataxia   Psychiatric:        Mood and Affect: Mood normal.     ED Results / Procedures / Treatments   Labs (all labs ordered are listed, but only abnormal results are displayed) Labs Reviewed  CBC WITH DIFFERENTIAL/PLATELET - Abnormal; Notable for the following components:      Result Value   Platelets 443 (*)    All other components within normal limits  PREGNANCY, URINE  BASIC METABOLIC PANEL    EKG None  Radiology No results found.  Procedures Procedures    Medications Ordered in ED Medications  sodium chloride 0.9 % bolus 1,000 mL (0 mLs Intravenous Stopped 03/12/23 1106)  metoCLOPramide (REGLAN) injection 10 mg (10 mg  Intravenous Given 03/12/23 1003)  diphenhydrAMINE (BENADRYL) injection 12.5 mg (12.5 mg Intravenous Given 03/12/23 1002)    ED Course/ Medical Decision Making/ A&P                             Medical Decision Making Amount and/or Complexity of Data Reviewed Labs: ordered.  Risk Prescription drug management.     37 year old female with medical history significant for IIH who presents to the emergency department with a headache.  The patient has had a headache since this past Wednesday.  She states that she has a history  of of IIH and had previously been on acetazolamide.  She stopped taking the medicine over a year and a half ago because she ran out and did not follow-up with neurology.  She has had no headaches since then.  She denies any visual field deficits, no vision changes, no blurry vision, no double vision.  No facial droop, numbness or weakness.  She denies any fever or chills or neck stiffness. She does state that for the past 2 weeks she has also had nasal congestion and drainage.  She endorses left-sided facial pain.  On arrival, the patient was vitally stable. Currently she is awake, alert, GCS 15, HDS, and afebrile. Her exam is most notable for normal gait, fully intact extraocular motions with bilaterally reactive pupils, no focal neurologic deficits, no meningismus, and no temporal tenderness. There is no rash. The headache was not sudden onset or the worst headache of the patient's life. There is no visual deficit.  I am most concerned for sinus headache vs tension type headache. Low concern for recurrence of IIH at this time as patient has been asymptomatic for the past year off medication and she has symptoms of sinusitis currently.  To further evaluate and risk stratify her, labs and imaging were obtained, which were significant for:  Labs: Preg neg, CBC and BMP generally unremarkable, mildly elevated platelets   I do not think the patient has an aneurysm, intracranial  bleed, mass lesion, meningitis, temporal arteritis, stroke, cluster headache, idiopathic intracranial hypertension, cavernous sinus thrombosis, carbon monoxide toxicity, herpes zoster, carotid or vertebral artery dissection, or acute angle close glaucoma.  On reassessment, the patient remained well appearing and was again able to ambulate without difficulty and did not have any focal neurologic deficits. Her symptoms had resolved. I don't think restarting her Diamox is indicated at this time as she has been otherwise asymptomatic for over a year, recommended outpatient follow-up with neurology, return precautions advised.  Will treat for developing bacterial sinusitis with Augmentin as sx of sinus congestion pressure and drainage have been present for over 10 days.  I believe the patient is stable for discharge.  We participated in shared decision making regarding continued observation in the ED versus discharge home for continued recovery after receiving the below medications. She preferred to recover at home. I believe that this is safe and reasonable.  We have discussed the diagnosis and risks, and we agree with discharging home to follow-up with their primary doctor. We also discussed returning to the Emergency Department immediately if new or worsening symptoms occur. We have discussed the symptoms which are most concerning (e.g., changing or worsening pain, weakness, vomiting, fever, or abnormal sensation) that necessitate immediate return. I provided ED return precautions. The patient felt safe with this plan.  ED Medication Summary: Medications  sodium chloride 0.9 % bolus 1,000 mL (0 mLs Intravenous Stopped 03/12/23 1106)  metoCLOPramide (REGLAN) injection 10 mg (10 mg Intravenous Given 03/12/23 1003)  diphenhydrAMINE (BENADRYL) injection 12.5 mg (12.5 mg Intravenous Given 03/12/23 1002)    Final Clinical Impression(s) / ED Diagnoses Final diagnoses:  Acute nonintractable headache,  unspecified headache type  Acute maxillary sinusitis, recurrence not specified    Rx / DC Orders ED Discharge Orders          Ordered    amoxicillin-clavulanate (AUGMENTIN) 875-125 MG tablet  Every 12 hours        03/12/23 1107    Ambulatory referral to Neurology       Comments: An appointment is  requested in approximately: 4 weeks   03/12/23 1107              Ernie Avena, MD 03/13/23 1626

## 2023-03-12 NOTE — ED Notes (Signed)
Pt. Received a liter bag of fluid before RN Earlene Plater took over the assignment

## 2023-03-12 NOTE — ED Triage Notes (Signed)
Pt c/o HA since last Wed; has hx of pseudotumor cerebri and sts feel similar to when she was dx with that

## 2023-05-29 ENCOUNTER — Emergency Department (HOSPITAL_BASED_OUTPATIENT_CLINIC_OR_DEPARTMENT_OTHER)
Admission: EM | Admit: 2023-05-29 | Discharge: 2023-05-29 | Disposition: A | Discharge status: Home / Self Care | Payer: No Typology Code available for payment source

## 2023-05-29 ENCOUNTER — Other Ambulatory Visit: Payer: Self-pay

## 2023-05-29 ENCOUNTER — Encounter (HOSPITAL_BASED_OUTPATIENT_CLINIC_OR_DEPARTMENT_OTHER): Payer: Self-pay | Admitting: Pediatrics

## 2023-05-29 DIAGNOSIS — E876 Hypokalemia: Secondary | ICD-10-CM

## 2023-05-29 DIAGNOSIS — Z79899 Other long term (current) drug therapy: Secondary | ICD-10-CM | POA: Diagnosis not present

## 2023-05-29 DIAGNOSIS — R112 Nausea with vomiting, unspecified: Secondary | ICD-10-CM | POA: Diagnosis present

## 2023-05-29 DIAGNOSIS — T495X5A Adverse effect of ophthalmological drugs and preparations, initial encounter: Secondary | ICD-10-CM | POA: Diagnosis not present

## 2023-05-29 DIAGNOSIS — T50905A Adverse effect of unspecified drugs, medicaments and biological substances, initial encounter: Secondary | ICD-10-CM

## 2023-05-29 LAB — URINALYSIS, ROUTINE W REFLEX MICROSCOPIC
Bilirubin Urine: NEGATIVE
Glucose, UA: NEGATIVE mg/dL
Ketones, ur: 15 mg/dL — AB
Leukocytes,Ua: NEGATIVE
Nitrite: NEGATIVE
Protein, ur: NEGATIVE mg/dL
Specific Gravity, Urine: 1.025 (ref 1.005–1.030)
pH: 6.5 (ref 5.0–8.0)

## 2023-05-29 LAB — CBC
HCT: 42.2 % (ref 36.0–46.0)
Hemoglobin: 14.8 g/dL (ref 12.0–15.0)
MCH: 30.8 pg (ref 26.0–34.0)
MCHC: 35.1 g/dL (ref 30.0–36.0)
MCV: 87.7 fL (ref 80.0–100.0)
Platelets: 465 10*3/uL — ABNORMAL HIGH (ref 150–400)
RBC: 4.81 MIL/uL (ref 3.87–5.11)
RDW: 12.6 % (ref 11.5–15.5)
WBC: 6 10*3/uL (ref 4.0–10.5)
nRBC: 0 % (ref 0.0–0.2)

## 2023-05-29 LAB — COMPREHENSIVE METABOLIC PANEL
ALT: 9 U/L (ref 0–44)
AST: 13 U/L — ABNORMAL LOW (ref 15–41)
Albumin: 4.1 g/dL (ref 3.5–5.0)
Alkaline Phosphatase: 83 U/L (ref 38–126)
Anion gap: 11 (ref 5–15)
BUN: 11 mg/dL (ref 6–20)
CO2: 23 mmol/L (ref 22–32)
Calcium: 9.4 mg/dL (ref 8.9–10.3)
Chloride: 101 mmol/L (ref 98–111)
Creatinine, Ser: 1.04 mg/dL — ABNORMAL HIGH (ref 0.44–1.00)
GFR, Estimated: >60 mL/min (ref >60)
Glucose, Bld: 85 mg/dL (ref 70–99)
Potassium: 3.1 mmol/L — ABNORMAL LOW (ref 3.5–5.1)
Sodium: 135 mmol/L (ref 135–145)
Total Bilirubin: 0.4 mg/dL (ref 0.3–1.2)
Total Protein: 7.5 g/dL (ref 6.5–8.1)

## 2023-05-29 LAB — URINALYSIS, MICROSCOPIC (REFLEX): RBC / HPF: >50 RBC/hpf (ref 0–5)

## 2023-05-29 LAB — MAGNESIUM: Magnesium: 1.7 mg/dL (ref 1.7–2.4)

## 2023-05-29 LAB — PREGNANCY, URINE: Preg Test, Ur: NEGATIVE

## 2023-05-29 LAB — LIPASE, BLOOD: Lipase: 55 U/L — ABNORMAL HIGH (ref 11–51)

## 2023-05-29 MED ORDER — METOCLOPRAMIDE HCL 5 MG/ML IJ SOLN
10.0000 mg | Freq: Once | INTRAMUSCULAR | Status: AC
  Administered 2023-05-29: 10 mg via INTRAVENOUS
  Filled 2023-05-29: qty 2

## 2023-05-29 MED ORDER — POTASSIUM CHLORIDE CRYS ER 20 MEQ PO TBCR: 20.0000 meq | EXTENDED_RELEASE_TABLET | Freq: Two times a day (BID) | ORAL | 0 refills | Status: AC

## 2023-05-29 MED ORDER — METOCLOPRAMIDE HCL 10 MG PO TABS
10.0000 mg | ORAL_TABLET | Freq: Four times a day (QID) | ORAL | 0 refills | Status: AC
Start: 2023-05-29 — End: ?

## 2023-05-29 MED ORDER — POTASSIUM CHLORIDE CRYS ER 20 MEQ PO TBCR
40.0000 meq | EXTENDED_RELEASE_TABLET | Freq: Once | ORAL | Status: AC
  Administered 2023-05-29: 40 meq via ORAL
  Filled 2023-05-29: qty 2

## 2023-05-29 MED ORDER — SODIUM CHLORIDE 0.9 % IV BOLUS
1000.0000 mL | Freq: Once | INTRAVENOUS | Status: AC
  Administered 2023-05-29: 1000 mL via INTRAVENOUS

## 2023-05-29 NOTE — ED Triage Notes (Signed)
Abdominal pain with NVD on and off for over a week ago. Stated she was started on oral antibiotics today.

## 2023-05-29 NOTE — ED Provider Notes (Signed)
[place] [facility] Provider Note   CSN: [id number] Arrival date & time: 05/29/23  1512     History  Chief Complaint  Patient presents with   Abdominal Pain    Karina [name] is a 37 y.o. female history of hypertension presented with nausea vomiting diarrhea for the past 2 weeks.  Patient seen by her primary care provider and was diagnosed with infectious GI diagnosis and given azithromycin.  Patient denies taking 1 dose.  Has not been helping.  Patient does note that right before she began having her symptoms she had a [place] shot.  Patient states she has not had previous complications with Wegovy.  Patient states she still has her gallbladder and appendix but states the pain is in the epigastric region from all of her emesis.  Patient denies hematemesis, hematochezia, melena, fevers, chest pain, shortness of breath, skin color changes, changes sensation/motor skills, abnormal vaginal bleeding or discharge.  Patient states that she is currently on her menstrual cycle    Home Medications Prior to Admission medications   Medication Sig Start Date End Date Taking? Authorizing Provider  metoCLOPramide (REGLAN) 10 MG tablet Take 1 tablet (10 mg total) by mouth every 6 (six) hours. 05/29/23  Yes Reilly Blades, Fayrene Fearing T, PA-C  potassium chloride SA (KLOR-CON M) 20 MEQ tablet Take 1 tablet (20 mEq total) by mouth 2 (two) times daily for 3 days. 05/29/23 06/01/23 Yes Molleigh Huot, Beverly Gust, PA-C  acetaZOLAMIDE (DIAMOX) 250 MG tablet Take 250 mg by mouth 2 (two) times daily. 03/24/21   [provider]  amLODipine (NORVASC) 10 MG tablet Take 10 mg by mouth daily.    Alver Fisher, RN  amoxicillin-clavulanate (AUGMENTIN) 875-125 MG tablet Take 1 tablet by mouth every 12 (twelve) hours. 03/12/23   Ernie Avena, MD  clonazePAM (KLONOPIN) 0.5 MG tablet Take half tablet twice daily as needed for anxiety 10/10/20   [provider]  escitalopram (LEXAPRO) 20 MG  tablet Take one a day 09/27/20   [provider]  FLUoxetine (PROZAC) 10 MG capsule Take 1 a day for a week, then 2 a day 04/13/21   [provider]  fluticasone (FLONASE) 50 MCG/ACT nasal spray Place 1 spray into both nostrils daily. 05/10/21   Raspet, Noberto Retort, PA-C  ibuprofen (ADVIL) 600 MG tablet Take 1 tablet (600 mg total) by mouth every 6 (six) hours as needed. 07/15/19   Geryl Rankins, MD  metoprolol succinate (TOPROL-XL) 50 MG 24 hr tablet Take 50 mg by mouth daily. Take with or immediately following a meal.    Alver Fisher, RN  naproxen (NAPROSYN) 375 MG tablet Take 1 tablet (375 mg total) by mouth 2 (two) times daily. 05/10/21   Raspet, Noberto Retort, PA-C  ofloxacin (FLOXIN) 0.3 % OTIC solution Place 5 drops into the left ear 2 (two) times daily. 05/10/21   Raspet, Erin K, PA-C  ondansetron (ZOFRAN ODT) 4 MG disintegrating tablet Take 1 tablet (4 mg total) by mouth every 8 (eight) hours as needed for nausea or vomiting. 12/11/20   Rushie Chestnut, PA-C  oxyCODONE-acetaminophen (PERCOCET/ROXICET) 5-325 MG tablet Take 1 tablet by mouth every 6 (six) hours as needed for severe pain. 03/03/22   Henderly, Britni A, PA-C  tamsulosin (FLOMAX) 0.4 MG CAPS capsule Take 1 capsule (0.4 mg total) by mouth daily after supper. 12/11/20   Rushie Chestnut, PA-C      Allergies    Hydrochlorothiazide and Phentermine  Review of Systems   Review of Systems  Gastrointestinal:  Positive for abdominal pain.    Physical Exam Updated Vital Signs BP (!) 156/109 (BP Location: Right Arm)   Pulse 93   Temp 97.8 F (36.6 C)   Resp 16   Ht 5\' 5"  (1.651 m)   Wt 98.9 kg   LMP 05/26/2023   SpO2 100%   BMI 36.28 kg/m  Physical Exam Vitals reviewed.  Constitutional:      General: She is not in acute distress. HENT:     Head: Normocephalic and atraumatic.  Eyes:     Extraocular Movements: Extraocular movements intact.     Conjunctiva/sclera: Conjunctivae normal.     Pupils: Pupils are  equal, round, and reactive to light.  Cardiovascular:     Rate and Rhythm: Normal rate and regular rhythm.     Pulses: Normal pulses.     Heart sounds: Normal heart sounds.     Comments: 2+ bilateral radial/dorsalis pedis pulses with regular rate Pulmonary:     Effort: Pulmonary effort is normal. No respiratory distress.     Breath sounds: Normal breath sounds.  Abdominal:     Palpations: Abdomen is soft.     Tenderness: There is no abdominal tenderness. There is no guarding or rebound.  Musculoskeletal:        General: Normal range of motion.     Cervical back: Normal range of motion and neck supple.     Comments: 5 out of 5 bilateral grip/leg extension strength  Skin:    General: Skin is warm and dry.     Capillary Refill: Capillary refill takes less than 2 seconds.  Neurological:     General: No focal deficit present.     Mental Status: She is alert and oriented to person, place, and time.     Comments: Sensation intact in all 4 limbs  Psychiatric:        Mood and Affect: Mood normal.     ED Results / Procedures / Treatments   Labs (all labs ordered are listed, but only abnormal results are displayed) Labs Reviewed  LIPASE, BLOOD - Abnormal; Notable for the following components:      Result Value   Lipase 55 (*)    All other components within normal limits  COMPREHENSIVE METABOLIC PANEL - Abnormal; Notable for the following components:   Potassium 3.1 (*)    Creatinine, Ser 1.04 (*)    AST 13 (*)    All other components within normal limits  CBC - Abnormal; Notable for the following components:   Platelets 465 (*)    All other components within normal limits  URINALYSIS, ROUTINE W REFLEX MICROSCOPIC - Abnormal; Notable for the following components:   APPearance CLOUDY (*)    Hgb urine dipstick LARGE (*)    Ketones, ur 15 (*)    All other components within normal limits  URINALYSIS, MICROSCOPIC (REFLEX) - Abnormal; Notable for the following components:   Bacteria,  UA FEW (*)    All other components within normal limits  PREGNANCY, URINE  MAGNESIUM    EKG None  Radiology No results found.  Procedures Procedures    Medications Ordered in ED Medications  metoCLOPramide (REGLAN) injection 10 mg (10 mg Intravenous Given 05/29/23 1626)  sodium chloride 0.9 % bolus 1,000 mL (0 mLs Intravenous Stopped 05/29/23 1830)  potassium chloride SA (KLOR-CON M) CR tablet 40 mEq (40 mEq Oral Given 05/29/23 1904)    ED Course/ Medical Decision Making/  A&P                                 Medical Decision Making Amount and/or Complexity of Data Reviewed Labs: ordered.  Risk Prescription drug management.   Staci Acosta 37 y.o. presented today for nausea vomiting. Working DDx that I considered at this time includes, but not limited to, medication side effect, gastroenteritis, colitis, small bowel obstruction, appendicitis, cholecystitis, hepatobiliary pathology, gastritis, PUD, ACS, aortic dissection pancreatitis, nephrolithiasis, AAA, UTI, pyelonephritis, ruptured ectopic pregnancy, PID, ovarian torsion.  R/o DDx: gastroenteritis, colitis, small bowel obstruction, appendicitis, cholecystitis, hepatobiliary pathology, gastritis, PUD, ACS, aortic dissection pancreatitis, nephrolithiasis, AAA, UTI, pyelonephritis, ruptured ectopic pregnancy, PID, ovarian torsio: These are considered less likely due to history of present illness, physical exam, labs/imaging findings.  Review of prior external notes: 03/12/2023 ED  Unique Tests and My Interpretation:  CBC with differential: Unremarkable CMP: Hypokalemia 3.1 Magnesium: Unremarkable Lipase: 55, most likely secondary to [facility] UA: Unremarkable Urine Pregnancy: Negative  Discussion with Independent Historian:  Significant other  Discussion of Management of Tests: None  Risk: Medium: prescription drug management  Risk Stratification Score: None  Plan: On exam patient was in no acute distress but was  noted to be hypertensive at 157/123.  Upon chart review this appears to be patient's baseline.  Patient states that her symptoms began after he had another COVID shot 2 weeks ago have a high suspicion patient has gastroparesis secondary to [facility] and she was nontender on exam did not show any peritoneal signs.  Will obtain labs and give fluids and Reglan and reassess.  Patient was mildly hypokalemic at 3.1 and will begin oral potassium.  Magnesium was added on.  This is most likely secondary to the nausea and vomiting.  Biological recheck on patient patient stated that she felt much better after the Reglan and that her symptoms have resolved.  The patient had shared decision making we agreed to forego further imaging at this time as we agreed patient's symptoms are most likely side effects of the [facility] given her labs being reassuring and that she does not have any abdominal tenderness or peritoneal signs.  Patient will be p.o. challenge and if successful will be discharged on Reglan encouraged to follow-up with primary care provider.  If unsuccessful will proceed with CT scan.  This plan was made in conjunction with the patient.  Patient was p.o. challenged and was able to successfully keep the food and fluids down.  At this time patient's urine is also negative.  Patient stable to be discharged with outpatient follow-up.  Patient states she feels so much better after the Reglan.  At this time I have a very high suspicion that her symptoms are secondary to the [facility] suspicion of intra-abdominal process causing patient's symptoms.  Patient states that she is still okay foregoing any imaging at this time would like to be discharged.  Patient did not had any episodes of emesis throughout ED stay.  Will discharge on Reglan and a few days of potassium pills.  Patient was given return precautions. Patient stable for discharge at this time.  Patient verbalized understanding of plan.  This chart was dictated using  voice recognition software.  Despite best efforts to proofread,  errors can occur which can change the documentation meaning.         Final Clinical Impression(s) / ED Diagnoses Final diagnoses:  Hypokalemia  Medication side effect, initial  encounter    Rx / DC Orders ED Discharge Orders          Ordered    potassium chloride SA (KLOR-CON M) 20 MEQ tablet  2 times daily        05/29/23 1731    metoCLOPramide (REGLAN) 10 MG tablet  Every 6 hours        05/29/23 1731              Netta Corrigan, PA-C 05/29/23 1937    Coral Spikes, DO 05/29/23 2236

## 2023-05-29 NOTE — Discharge Instructions (Addendum)
Please follow-up with your primary care provider in regards to recent ER visit and symptoms.  We suspect that your symptoms are most likely a side effect of your [id number] and I prescribed you Reglan to help with the nausea vomiting at home.  As we discussed we agreed to forego treatment at this time however if symptoms change or worsen please return to ER.  Please remain hydrated and follow-up with primary care provider.

## 2023-05-29 NOTE — ED Notes (Signed)
Pt given graham crackers and ginger ale ?

## 2023-05-29 NOTE — ED Notes (Signed)
Pt. Is unable to keep anything down d/t n/v. Holding PO potassium until we're sure she keep it down.

## 2024-08-04 NOTE — Telephone Encounter (Addendum)
 Unsucessful in reaching pt; Left message for a return call RE: pathology results [phone].  Raenelle MARLA Kays, RN   ----- Message from Rockey Essex, MD sent at 08/03/2024  8:42 PM EST ----- Please let the patient know that the pathology report was benign and no additional treatment is necessary at this time.  Electronically signed by: Rockey Dallas Essex, MD 08/03/2024 8:42 PM

## 2024-08-05 NOTE — Telephone Encounter (Addendum)
 Unable to reach patient to give MD's pathology report directive. LMOM for a call back. Will try again later.   ----- Message from Rockey Essex, MD sent at 08/03/2024  8:42 PM EST ----- Please let the patient know that the pathology report was benign and no additional treatment is necessary at this time.  Electronically signed by: Rockey Dallas Essex, MD 08/03/2024 8:42 PM

## 2024-09-01 NOTE — Progress Notes (Signed)
 Pt reports mild intermittent nausea.  Usually starting around day 4.   She also has c/o mid right back pain x 3 days. Denies fever, she has noticed a decrease in frequency.  Pain radiates around to right mid to lower abdomen

## 2024-09-01 NOTE — Telephone Encounter (Signed)
 Can we see if STAT CT abdomen/pelvis can be done today? Concern for appendicitis. If not able to be done needs to go to ER. Ordered.

## 2024-09-01 NOTE — Telephone Encounter (Signed)
 As explained to provider, patient is unable to go today, she has other obligations and states she will not go to ED.  Appointment scheduled at [facility] for tomorrow morning at 11:15.  Patient has been advised and is agreeable

## 2024-09-01 NOTE — Progress Notes (Signed)
 [facility]  - [facility] [place] Follow Up Visit Valerie Cones DOB: 01-23-1986  MRN: [id number]  Visit Date: 09/01/2024 Encounter Provider: Tinnie Almarie Forts, PA-C  PCP: Tinnie Almarie Forts, PA-C  Subjective:    Chief Complaint  Patient presents with   Follow-up    [facility] follow up   Back Pain    Mid right   Weight Loss Patient presents for follow up of her physician assisted weight loss efforts. Has tried in the past: exercise program and fad diets. Currently taking: wegoy 0.5 mg weekly, without significant side effects (including, but not limited to: anxiety, palpitations, SOB, significant GI upset). Exercise level: moderately active. Eating a low calorie diet: yes. Has lost 4# since last follow up.  Comorbid conditions: HTN Wt Readings from Last 3 Encounters:  09/01/24 104 kg (230 lb 3.2 oz)  07/30/24 106 kg (234 lb)  07/21/24 106 kg (234 lb)   Kanon Colunga is a 39 y.o. female who presents today with complaints of abdominal pain. The pain is described as cramping, and is moderate in intensity. The patient is experiencing right flank  with radiation to RLQ. Onset was a few days ago. Symptoms have been unchanged. Aggravating factors: movement.  Alleviating factors: none. Associated symptoms: decreased urination. The patient denies anorexia, arthralagias, belching, chills, constipation, diarrhea, dysuria, fever, flatus, frequency, headache, hematochezia, hematuria, melena, myalgias, nausea, sweats, and vomiting.   The following portions of the patient's history were reviewed and updated as appropriate: Patient Active Problem List   Diagnosis Date Noted   Inflamed epidermoid cyst of skin 07/30/2024   Nocturia 05/04/2022   Atopic dermatitis in adult 05/01/2022   Kidney stones 05/01/2022   Trichomonas infection 11/09/2021   Environmental allergies 02/01/2020   Seasonal allergic rhinitis due to pollen 02/01/2020   Class  2 severe obesity due to excess calories with serious comorbidity and body mass index (BMI) of 38.0 to 38.9 in adult 06/19/2019   Insomnia due to other mental disorder 04/25/2019   Anxiety 03/11/2019   Pseudotumor cerebri 03/10/2019   Essential hypertension 11/22/2014     Current Outpatient Medications  Medication Instructions   acetaZOLAMIDE  (DIAMOX ) 250 mg, oral, 2 times daily   amLODIPine (NORVASC) 10 mg, oral, Daily   clindamycin (CLINDAGEL) 1 % gel topical, Daily, Apply to affected areas   clonazePAM  (KlonoPIN ) 0.5 mg tablet Take 1 - 2 tablets daily as needed for anxiety/panic attacks   lamoTRIgine (LaMICtal) 100 mg tablet Take 1 tab daily   methocarbamoL (ROBAXIN) 500 mg, oral, At  bedtime PRN   metoprolol tartrate (LOPRESSOR) 50 mg, oral, Daily   metroNIDAZOLE  (FLAGYL ) 500 mg, oral, 2 times daily   mometasone (ELOCON) 0.1 % oint ointment topical, Daily, Max 14 consecutive days.   semaglutide (weight loss) (WEGOVY) 0.5 mg, subcutaneous, Every 7 days   traZODone (DESYREL) 50 mg tablet Take 1/2 - 1 tab nightly for sleep   tretinoin (Retin-A) 0.025 % cream Apply 0.25 g in a thin layer to full face nightly as tolerated.      She  has a past surgical history that includes Salpingectomy and Tubal ligation. Her family history includes Eclampsia in her father and mother; Hypertension in her father and mother; Kidney disease in her father. She is allergic to doxycycline , hydrochlorothiazide , and phentermine..   Review of Systems - History obtained from the patient Complete ROS negative except those noted in the HPI or elsewhere in note  Objective  Objective    Physical Exam  Vital signs and nursing note reviewed. BP (!) 140/92   Pulse 64   Temp 97.3 F (36.3 C) (Temporal)   Resp 16   Wt 104 kg (230 lb 3.2 oz)   SpO2 99%   BMI 38.31 kg/m    General appearance: alert, appears stated age and cooperative Head: Normocephalic and atrauamatic Eyes: Conjunctiva  clear, EOMs intact, no scleral icterus noted Neck: no adenopathy, no carotid bruit, no JVD; neck is supple, symmetrical; trachea midline and thyroid not enlarged, symmetric, no tenderness/mass/nodules Heart: regular rate and rhythm, S1, S2 normal, no murmur, click, rub or gallop Lungs: clear to auscultation bilaterally; no wheezes, rhonchi, crackles, or rales and in no acute distress. Breathing is unlabored. Extremities: extremities normal, atraumatic, no cyanosis or edema Pulses: 2+ and symmetric  Psych: Mood and affect appropriate Skin: color, texture, and turgor normal; no visualized rashes or lesions   Recent lab work reviewed and analyzed: Lab Results  Component Value Date   WBC 5.30 04/30/2024   HGB 12.5 04/30/2024   HCT 36.7 04/30/2024   MCV 88.1 04/30/2024   PLT 444 04/30/2024   ALT 7 04/30/2024   AST 11 (L) 04/30/2024   CREATININE 0.88 04/30/2024   BUN 8 04/30/2024   EGFR 87 04/30/2024   NA 140 04/30/2024   K 4.0 04/30/2024   CL 107 04/30/2024   CO2 26 04/30/2024   HGBA1C 5.4 04/25/2023   LDLCALC 126 (H) 04/30/2024     Recent imaging reviewed and analyzed: US  Soft Tissue Head And Or Neck Narrative: US  SOFT TISSUE HEAD AND OR NECK, 06/23/2024 3:59 PM   INDICATION: mass of right cheek, Sebaceous cyst \ L72.3 Sebaceous cyst  mass of right cheek ADDITIONAL HISTORY: None. COMPARISON: None.  TECHNIQUE: Real-time, multiplanar grayscale ultrasound was performed at the indicated area of concern, supplemented by color Doppler as needed.   FINDINGS:  Targeted interrogation of the right demonstrates 15 and 5 mm cutaneous hypoechoic structures Impression: Targeted interrogation performed at the patient indicated area of concern correlates with likely sebaceous cysts.   Results for orders placed or performed in visit on 09/01/24  POC Urinalysis Auto without Microscopic   Collection Time: 09/01/24 12:28 PM  Result Value Ref Range   Color, Urine Light Yellow Yellow    Clarity, Urine Slightly-Cloudy (A) Clear   Glucose, Urine Negative Negative mg/dL   Bilirubin, Urine Negative Negative   Ketones, Urine Negative Negative mg/dL   Specific Gravity, Urine 1.015 1.010, 1.015, 1.020, 1.025   Blood, Urine Negative Negative   pH, Urine 6.0 5.0, 5.5, 6.0, 6.5, 7.0, 7.5, 8.0   Protein, Urine Negative Negative mg/dL   Urobilinogen, Urine 0.2 <2.0 mg/dL   Nitrite, Urine Negative Negative   Leukocyte Esterase, Urine Small (A) Negative   Kit/Device Lot # [id number]    Kit/Device Expiration Date 31Oct26   POC HCG Qualitative, Urine   Collection Time: 09/01/24 12:29 PM  Result Value Ref Range   HCG, Urine, POC Negative Negative   Internal Control Acceptable    Specific Gravity, Urine 1.015 1.010, 1.015, 1.020, 1.025   Kit/Device Lot # [id number]    Kit/Device Expiration Date 31Aug26         Assessment/Plan    1. Essential hypertension (Primary) Mildly elevated today likely due to pain. Monitor.   2. Class 2 severe obesity due to excess calories with serious comorbidity and body mass index (BMI) of 38.0 to 38.9 in adult Continue ramping up wegovy dose. Continue lifestyle and diet changes  3. RLQ abdominal pain 4. Acute right-sided thoracic back pain Urine dip not indicative of infection but will send for UA and urine culture. I have also ordered CMP, lipase, CBC. UPT negative. Given RLQ pain will get STAT CT abdomen/pelvis to further evaluate. Message sent to CMA to see if we can get this scheduled today; patient advised if sx worsen to go to ER. She agrees but would lke to avoid the ER if possible. Given symptoms started after lifting a heavy child and seems to worsen with certain positions, I am suspicious for possible musculoskeletal etiology, so will send robaxin to try while waiting for scan.  - POC Urinalysis Auto without Microscopic - POC HCG Qualitative, Urine - Urine Culture; Future - Comprehensive Metabolic Panel; Future - CBC with Differential;  Future - Lipase; Future - CT Abdomen Pelvis W Contrast; Future - Urinalysis with Reflex to Microscopic; Future - Urine Culture - Comprehensive Metabolic Panel - CBC with Differential - Lipase - Urinalysis with Reflex to Microscopic - methocarbamoL (ROBAXIN) 500 mg tablet; Take 1 tablet (500 mg total) by mouth nightly as needed for muscle spasms.  Dispense: 30 tablet; Refill: 0  5. Trichomonas infection - Trichomonas Culture; Future - Trichomonas Culture   Patient expresses understanding of their current medications and use.  If a new prescription was given today, then I discussed potential side effects, drug interactions, instructions for taking the medication, and the consequences of not taking it.   Patient verbalized an understanding of these instructions. Patient's medical and personal goals were discussed today. Barriers to current goals: none evident The following portions of the patient's history were reviewed and updated as appropriate: allergies, current medications, past family history, past medical history, past social history, past surgical history and problem list. No follow-ups on file. Call sooner if need arises.   Future Appointments  Date Time Provider Department Center  10/27/2024  9:20 AM Tinnie Norris Evangelina R[facility] [address]  12/02/2024  2:00 PM [facility] [place] [facility] [address]  12/02/2024  2:30 PM Ozell Clyde Batman, MD [facility] [address]    Lauren Norris Evangelina, PA-C

## 2024-09-02 ENCOUNTER — Encounter (HOSPITAL_BASED_OUTPATIENT_CLINIC_OR_DEPARTMENT_OTHER): Payer: Self-pay

## 2024-09-02 ENCOUNTER — Other Ambulatory Visit: Payer: Self-pay

## 2024-09-02 ENCOUNTER — Emergency Department (HOSPITAL_BASED_OUTPATIENT_CLINIC_OR_DEPARTMENT_OTHER)
Admission: EM | Admit: 2024-09-02 | Discharge: 2024-09-02 | Disposition: A | Discharge status: Home / Self Care | Attending: Emergency Medicine | Admitting: Emergency Medicine

## 2024-09-02 ENCOUNTER — Emergency Department (HOSPITAL_BASED_OUTPATIENT_CLINIC_OR_DEPARTMENT_OTHER)

## 2024-09-02 DIAGNOSIS — R112 Nausea with vomiting, unspecified: Secondary | ICD-10-CM | POA: Diagnosis not present

## 2024-09-02 DIAGNOSIS — M546 Pain in thoracic spine: Secondary | ICD-10-CM | POA: Insufficient documentation

## 2024-09-02 DIAGNOSIS — R300 Dysuria: Secondary | ICD-10-CM | POA: Diagnosis not present

## 2024-09-02 DIAGNOSIS — I10 Essential (primary) hypertension: Secondary | ICD-10-CM

## 2024-09-02 DIAGNOSIS — R1031 Right lower quadrant pain: Secondary | ICD-10-CM | POA: Insufficient documentation

## 2024-09-02 DIAGNOSIS — R10A1 Flank pain, right side: Secondary | ICD-10-CM | POA: Diagnosis present

## 2024-09-02 LAB — BASIC METABOLIC PANEL WITH GFR
Anion gap: 11 (ref 5–15)
BUN: 14 mg/dL (ref 6–20)
CO2: 19 mmol/L — ABNORMAL LOW (ref 22–32)
Calcium: 9.1 mg/dL (ref 8.9–10.3)
Chloride: 109 mmol/L (ref 98–111)
Creatinine, Ser: 0.87 mg/dL (ref 0.44–1.00)
GFR, Estimated: >60 mL/min
Glucose, Bld: 86 mg/dL (ref 70–99)
Potassium: 3.6 mmol/L (ref 3.5–5.1)
Sodium: 138 mmol/L (ref 135–145)

## 2024-09-02 LAB — CBC
HCT: 40.5 % (ref 36.0–46.0)
Hemoglobin: 14 g/dL (ref 12.0–15.0)
MCH: 29.8 pg (ref 26.0–34.0)
MCHC: 34.6 g/dL (ref 30.0–36.0)
MCV: 86.2 fL (ref 80.0–100.0)
Platelets: 405 K/uL — ABNORMAL HIGH (ref 150–400)
RBC: 4.7 MIL/uL (ref 3.87–5.11)
RDW: 13.4 % (ref 11.5–15.5)
WBC: 5.4 K/uL (ref 4.0–10.5)
nRBC: 0 % (ref 0.0–0.2)

## 2024-09-02 MED ORDER — PREDNISONE 10 MG PO TABS: 40.0000 mg | ORAL_TABLET | Freq: Every day | ORAL | 0 refills | Status: AC

## 2024-09-02 MED ORDER — OXYCODONE HCL 5 MG PO TABS: 5.0000 mg | ORAL_TABLET | Freq: Three times a day (TID) | ORAL | 0 refills | Status: AC | PRN

## 2024-09-02 MED ORDER — IOHEXOL 300 MG/ML  SOLN
100.0000 mL | Freq: Once | INTRAMUSCULAR | Status: AC | PRN
  Administered 2024-09-02: 100 mL via INTRAVENOUS

## 2024-09-02 MED ORDER — HYDROMORPHONE HCL 1 MG/ML IJ SOLN
0.5000 mg | Freq: Once | INTRAMUSCULAR | Status: AC
  Administered 2024-09-02: 0.5 mg via INTRAVENOUS
  Filled 2024-09-02: qty 1

## 2024-09-02 MED ORDER — KETOROLAC TROMETHAMINE 30 MG/ML IJ SOLN
30.0000 mg | Freq: Once | INTRAMUSCULAR | Status: AC
  Administered 2024-09-02: 30 mg via INTRAVENOUS
  Filled 2024-09-02: qty 1

## 2024-09-02 NOTE — ED Triage Notes (Signed)
 Pt presents with right flank pain that she states feels like a kidney stone. States this has been going on since Saturday.   Also endorses dizziness that started a week ago. Pt ambulatory to room.

## 2024-09-02 NOTE — ED Provider Notes (Signed)
 " [facility] Provider Note   CSN: [id number] Arrival date & time: 09/02/24  9186     Patient presents with: Flank Pain   Karina [name] is a 39 y.o. female presenting to the ED with complaint of flank pain.  Patient reports she had a right flank pain and right lower abdominal pain that began 5 days ago.  Initially there was nausea and vomiting.  She is having regular bowel movements.  She reports the pain is significantly gotten worse and now is quite positional and she cannot sit upright.  She is she is having some urinary pressure as well or discomfort with urination.  She saw her PCP yesterday who did lab work including a negative pregnancy test, white blood cell count normal, lipase and LFTs unremarkable.  She was arranged for an outpatient stat urgent CT scan at noon today, to evaluate for appendicitis, but says she could not tolerate waiting for her scan.   HPI     Prior to Admission medications  Medication Sig Start Date End Date Taking? Authorizing Provider  methocarbamol (ROBAXIN) 500 MG tablet Take 500 mg by mouth at bedtime. Prn muscle spasms 09/01/24 10/01/24 Yes [provider]  oxyCODONE  (ROXICODONE ) 5 MG immediate release tablet Take 1 tablet (5 mg total) by mouth every 8 (eight) hours as needed for up to 12 doses for severe pain (pain score 7-10). 09/02/24  Yes Sahily Biddle, Donnice JINNY, MD  predniSONE  (DELTASONE ) 10 MG tablet Take 4 tablets (40 mg total) by mouth daily with breakfast for 5 days. 09/02/24 09/07/24 Yes Anaira Seay, Donnice JINNY, MD  semaglutide-weight management [facility]) 0.5 MG/0.5ML SOAJ SQ injection Inject 0.5 mg into the skin once a week. 08/24/24  Yes [provider]  acetaZOLAMIDE  (DIAMOX ) 250 MG tablet Take 250 mg by mouth 2 (two) times daily. 03/24/21   [provider]  amLODipine (NORVASC) 10 MG tablet Take 10 mg by mouth daily.    Lindner, Jodi N, RN  amoxicillin -clavulanate (AUGMENTIN ) 875-125 MG tablet  Take 1 tablet by mouth every 12 (twelve) hours. 03/12/23   Jerrol Agent, MD  clonazePAM  (KLONOPIN ) 0.5 MG tablet Take half tablet twice daily as needed for anxiety 10/10/20   [provider]  escitalopram (LEXAPRO) 20 MG tablet Take one a day 09/27/20   [provider]  FLUoxetine (PROZAC) 10 MG capsule Take 1 a day for a week, then 2 a day 04/13/21   [provider]  fluticasone  (FLONASE ) 50 MCG/ACT nasal spray Place 1 spray into both nostrils daily. 05/10/21   Raspet, Erin K, PA-C  ibuprofen  (ADVIL ) 600 MG tablet Take 1 tablet (600 mg total) by mouth every 6 (six) hours as needed. 07/15/19   Timmie Norris, MD  metoCLOPramide  (REGLAN ) 10 MG tablet Take 1 tablet (10 mg total) by mouth every 6 (six) hours. 05/29/23   Victor Agent DASEN, PA-C  metoprolol succinate (TOPROL-XL) 50 MG 24 hr tablet Take 50 mg by mouth daily. Take with or immediately following a meal.    Curt Myla SAILOR, RN  naproxen  (NAPROSYN ) 375 MG tablet Take 1 tablet (375 mg total) by mouth 2 (two) times daily. 05/10/21   Raspet, Erin K, PA-C  ofloxacin  (FLOXIN ) 0.3 % OTIC solution Place 5 drops into the left ear 2 (two) times daily. 05/10/21   Raspet, Erin K, PA-C  ondansetron  (ZOFRAN  ODT) 4 MG disintegrating tablet Take 1 tablet (4 mg total) by mouth every 8 (eight) hours as needed for nausea  or vomiting. 12/11/20   Tonette Lauraine HERO, PA-C  oxyCODONE -acetaminophen  (PERCOCET/ROXICET) 5-325 MG tablet Take 1 tablet by mouth every 6 (six) hours as needed for severe pain. 03/03/22   Henderly, Britni A, PA-C  potassium chloride  SA (KLOR-CON  M) 20 MEQ tablet Take 1 tablet (20 mEq total) by mouth 2 (two) times daily for 3 days. 05/29/23 06/01/23  Victor Lynwood DASEN, PA-C  tamsulosin  (FLOMAX ) 0.4 MG CAPS capsule Take 1 capsule (0.4 mg total) by mouth daily after supper. 12/11/20   Tonette Lauraine HERO, PA-C    Allergies: Doxycycline , Hydrochlorothiazide , and Phentermine    Review of Systems  Updated Vital Signs BP (!) 153/110    Pulse 72   Temp 98.7 F (37.1 C)   Resp 18   Ht 5' 5 (1.651 m)   Wt 103.9 kg   LMP 08/17/2024 (Approximate)   SpO2 100%   BMI 38.11 kg/m   Physical Exam Constitutional:      General: She is not in acute distress.    Appearance: She is obese.  HENT:     Head: Normocephalic and atraumatic.  Eyes:     Conjunctiva/sclera: Conjunctivae normal.     Pupils: Pupils are equal, round, and reactive to light.  Cardiovascular:     Rate and Rhythm: Normal rate and regular rhythm.  Pulmonary:     Effort: Pulmonary effort is normal. No respiratory distress.  Abdominal:     General: There is no distension.     Tenderness: There is abdominal tenderness in the right lower quadrant. There is guarding and rebound. Negative signs include Murphy's sign and McBurney's sign.  Skin:    General: Skin is warm and dry.  Neurological:     General: No focal deficit present.     Mental Status: She is alert. Mental status is at baseline.  Psychiatric:        Mood and Affect: Mood normal.        Behavior: Behavior normal.     (all labs ordered are listed, but only abnormal results are displayed) Labs Reviewed  BASIC METABOLIC PANEL WITH GFR - Abnormal; Notable for the following components:      Result Value   CO2 19 (*)    All other components within normal limits  CBC - Abnormal; Notable for the following components:   Platelets 405 (*)    All other components within normal limits    EKG: None  Radiology: CT ABDOMEN PELVIS W CONTRAST Result Date: 09/02/2024 CLINICAL DATA:  Right-sided abdominal pain for 3 days. EXAM: CT ABDOMEN AND PELVIS WITH CONTRAST TECHNIQUE: Multidetector CT imaging of the abdomen and pelvis was performed using the standard protocol following bolus administration of intravenous contrast. RADIATION DOSE REDUCTION: This exam was performed according to the departmental dose-optimization program which includes automated exposure control, adjustment of the mA and/or kV  according to patient size and/or use of iterative reconstruction technique. CONTRAST:  OMNIPAQUE  IOHEXOL  300 MG/ML  SOLN COMPARISON:  June 19, 2023. FINDINGS: Lower chest: No acute abnormality. Hepatobiliary: No focal liver abnormality is seen. No gallstones, gallbladder wall thickening, or biliary dilatation. Pancreas: Unremarkable. No pancreatic ductal dilatation or surrounding inflammatory changes. Spleen: Normal in size without focal abnormality. Adrenals/Urinary Tract: Adrenal glands are unremarkable. Kidneys are normal, without renal calculi, focal lesion, or hydronephrosis. Bladder is unremarkable. Stomach/Bowel: Stomach is within normal limits. Appendix appears normal. No evidence of bowel wall thickening, distention, or inflammatory changes. Vascular/Lymphatic: No significant vascular findings are present. No enlarged abdominal or pelvic  lymph nodes. Reproductive: Uterus and bilateral adnexa are unremarkable. Other: No abdominal wall hernia or abnormality. No abdominopelvic ascites. Musculoskeletal: No acute or significant osseous findings. IMPRESSION: No definite abnormality seen in the abdomen or pelvis. Electronically Signed   By: Lynwood Landy Raddle M.D.   On: 09/02/2024 10:20     Procedures   Medications Ordered in the ED  ketorolac  (TORADOL ) 30 MG/ML injection 30 mg (30 mg Intravenous Given 09/02/24 0858)  HYDROmorphone  (DILAUDID ) injection 0.5 mg (0.5 mg Intravenous Given 09/02/24 0856)  iohexol  (OMNIPAQUE ) 300 MG/ML solution 100 mL (100 mLs Intravenous Contrast Given 09/02/24 0957)                                    Medical Decision Making Amount and/or Complexity of Data Reviewed Labs: ordered. Radiology: ordered.  Risk Prescription drug management.   This patient presents to the ED with concern for low back pain and abdominal pain. This involves an extensive number of treatment options, and is a complaint that carries with it a high risk of complications and morbidity.  The  differential diagnosis includes ureteral colic versus kidney infection versus diverticulitis or colitis versus appendicitis versus referred spinal pain versus other   External records from outside source obtained and reviewed including outpatient blood test, urine sample, pregnancy screening from yesterday, with no emergent findings.  I ordered and personally interpreted labs.  The pertinent results include: No emergent findings  I ordered imaging studies including CT abdomen pelvis with contrast I independently visualized and interpreted imaging which showed no emergent findings I agree with the radiologist interpretation   I ordered medication including IV pain medicine  I have reviewed the patients home medicines and have made adjustments as needed  Test Considered: Low indication for pelvic pathology, including PID.  No indication for pelvic ultrasound.   After the interventions noted above, I reevaluated the patient and found that they have: improved   There is a strong musculoskeletal component where the symptoms seem to be worse with sitting upright and axial loading, seem to reproducible with movement.  This would be more suggestive of a likely referred spinal pain, possible nerve impingement at the thoracic level.  Dispostion:  After consideration of the diagnostic results and the patients response to treatment, I feel that the patent would benefit from outpatient follow-up.      Final diagnoses:  Acute thoracic back pain, unspecified back pain laterality    ED Discharge Orders          Ordered    oxyCODONE  (ROXICODONE ) 5 MG immediate release tablet  Every 8 hours PRN        09/02/24 1140    predniSONE  (DELTASONE ) 10 MG tablet  Daily with breakfast        09/02/24 1140               Cottie Donnice PARAS, MD 09/02/24 1143  "

## 2024-09-02 NOTE — Discharge Instructions (Addendum)
 You may be experiencing referred pain from your spine or your back, which can include a pinched nerve or facet problems.  Your blood tests, CT imaging did not show any emergency findings.  I did recommend conservative care with over-the-counter pain type medications, including Tylenol  and ibuprofen  or Aleve .  I also prescribed a short course of prednisone  which is an anti-inflammatory steroid.  I prescribed you a few tablets of oxycodone , which is an opioid narcotic, for severe pain only.  Please minimize use of this medicine.  Long-term use of opioids have been linked to addiction and dependency.  You should return to the ER if you have new or worsening symptoms.  Please also be aware that your blood pressure was higher than normal today.  This may be related to pain or discomfort.  However, it is important that you follow-up with this with your primary care provider, and that you monitor your blood pressure at home.
